# Patient Record
Sex: Female | Born: 1950 | Race: White | Hispanic: No | Marital: Single | Smoking: Never smoker
Health system: Southern US, Community
[De-identification: ages and names within clinical notes are randomized; demographics above are authoritative.]

## PROBLEM LIST (undated history)

## (undated) DIAGNOSIS — R42 Dizziness and giddiness: Secondary | ICD-10-CM

## (undated) DIAGNOSIS — R51 Headache: Secondary | ICD-10-CM

## (undated) DIAGNOSIS — J45909 Unspecified asthma, uncomplicated: Secondary | ICD-10-CM

## (undated) DIAGNOSIS — N6009 Solitary cyst of unspecified breast: Secondary | ICD-10-CM

## (undated) DIAGNOSIS — R519 Headache, unspecified: Secondary | ICD-10-CM

## (undated) DIAGNOSIS — R252 Cramp and spasm: Secondary | ICD-10-CM

## (undated) DIAGNOSIS — M7120 Synovial cyst of popliteal space [Baker], unspecified knee: Secondary | ICD-10-CM

## (undated) DIAGNOSIS — E7800 Pure hypercholesterolemia, unspecified: Secondary | ICD-10-CM

## (undated) DIAGNOSIS — K219 Gastro-esophageal reflux disease without esophagitis: Secondary | ICD-10-CM

## (undated) DIAGNOSIS — M7122 Synovial cyst of popliteal space [Baker], left knee: Secondary | ICD-10-CM

## (undated) DIAGNOSIS — H9190 Unspecified hearing loss, unspecified ear: Secondary | ICD-10-CM

## (undated) HISTORY — DX: Headache, unspecified: R51.9

## (undated) HISTORY — DX: Solitary cyst of unspecified breast: N60.09

## (undated) HISTORY — DX: Pure hypercholesterolemia, unspecified: E78.00

## (undated) HISTORY — DX: Unspecified hearing loss, unspecified ear: H91.90

## (undated) HISTORY — DX: Cramp and spasm: R25.2

## (undated) HISTORY — PX: BREAST SURGERY: SHX581

## (undated) HISTORY — PX: BILATERAL OOPHORECTOMY: SHX1221

## (undated) HISTORY — PX: BREAST EXCISIONAL BIOPSY: SUR124

## (undated) HISTORY — DX: Headache: R51

## (undated) HISTORY — DX: Dizziness and giddiness: R42

## (undated) HISTORY — DX: Synovial cyst of popliteal space (Baker), unspecified knee: M71.20

## (undated) HISTORY — DX: Gastro-esophageal reflux disease without esophagitis: K21.9

## (undated) HISTORY — PX: BREAST CYST EXCISION: SHX579

## (undated) HISTORY — DX: Unspecified asthma, uncomplicated: J45.909

## (undated) HISTORY — DX: Synovial cyst of popliteal space (Baker), left knee: M71.22

## (undated) HISTORY — PX: SEPTOPLASTY: SUR1290

---
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
## 1985-09-19 HISTORY — PX: ABDOMINAL HYSTERECTOMY: SHX81

## 1996-09-19 HISTORY — PX: TEMPOROMANDIBULAR JOINT SURGERY: SHX35

## 2013-02-14 ENCOUNTER — Other Ambulatory Visit (HOSPITAL_COMMUNITY): Payer: Self-pay | Admitting: *Deleted

## 2013-02-14 DIAGNOSIS — Z1231 Encounter for screening mammogram for malignant neoplasm of breast: Secondary | ICD-10-CM

## 2013-02-19 ENCOUNTER — Ambulatory Visit (HOSPITAL_COMMUNITY): Payer: No Typology Code available for payment source

## 2013-02-20 ENCOUNTER — Other Ambulatory Visit: Payer: Self-pay | Admitting: *Deleted

## 2013-02-20 DIAGNOSIS — N644 Mastodynia: Secondary | ICD-10-CM

## 2013-03-20 ENCOUNTER — Ambulatory Visit
Admission: RE | Admit: 2013-03-20 | Discharge: 2013-03-20 | Disposition: A | Discharge status: Home / Self Care | Payer: No Typology Code available for payment source | Source: Ambulatory Visit | Attending: *Deleted | Admitting: *Deleted

## 2013-03-20 DIAGNOSIS — N644 Mastodynia: Secondary | ICD-10-CM

## 2013-03-25 ENCOUNTER — Other Ambulatory Visit (HOSPITAL_COMMUNITY)
Admission: RE | Admit: 2013-03-25 | Discharge: 2013-03-25 | Disposition: A | Discharge status: Home / Self Care | Payer: No Typology Code available for payment source | Source: Ambulatory Visit | Attending: Family Medicine | Admitting: Family Medicine

## 2013-03-25 ENCOUNTER — Other Ambulatory Visit: Payer: Self-pay | Admitting: Family Medicine

## 2013-03-25 DIAGNOSIS — R87810 Cervical high risk human papillomavirus (HPV) DNA test positive: Secondary | ICD-10-CM | POA: Insufficient documentation

## 2013-03-25 DIAGNOSIS — Z1151 Encounter for screening for human papillomavirus (HPV): Secondary | ICD-10-CM | POA: Insufficient documentation

## 2013-03-25 DIAGNOSIS — Z01419 Encounter for gynecological examination (general) (routine) without abnormal findings: Secondary | ICD-10-CM | POA: Insufficient documentation

## 2013-06-10 ENCOUNTER — Ambulatory Visit
Admission: RE | Admit: 2013-06-10 | Discharge: 2013-06-10 | Disposition: A | Discharge status: Home / Self Care | Payer: No Typology Code available for payment source | Source: Ambulatory Visit | Attending: Family Medicine | Admitting: Family Medicine

## 2013-06-10 ENCOUNTER — Other Ambulatory Visit: Payer: Self-pay | Admitting: Family Medicine

## 2013-06-10 DIAGNOSIS — R079 Chest pain, unspecified: Secondary | ICD-10-CM

## 2013-06-10 DIAGNOSIS — R05 Cough: Secondary | ICD-10-CM

## 2013-11-06 ENCOUNTER — Encounter: Payer: Self-pay | Admitting: Cardiology

## 2013-11-07 ENCOUNTER — Ambulatory Visit: Payer: No Typology Code available for payment source | Admitting: Cardiology

## 2013-12-03 ENCOUNTER — Ambulatory Visit: Payer: No Typology Code available for payment source | Admitting: Cardiology

## 2013-12-09 ENCOUNTER — Ambulatory Visit (INDEPENDENT_AMBULATORY_CARE_PROVIDER_SITE_OTHER): Payer: No Typology Code available for payment source | Admitting: Cardiology

## 2013-12-09 ENCOUNTER — Encounter: Payer: Self-pay | Admitting: Cardiology

## 2013-12-09 ENCOUNTER — Encounter (INDEPENDENT_AMBULATORY_CARE_PROVIDER_SITE_OTHER): Payer: Self-pay

## 2013-12-09 VITALS — BP 108/68 | HR 61 | Ht 64.0 in | Wt 142.1 lb

## 2013-12-09 DIAGNOSIS — M549 Dorsalgia, unspecified: Secondary | ICD-10-CM

## 2013-12-09 DIAGNOSIS — Z8249 Family history of ischemic heart disease and other diseases of the circulatory system: Secondary | ICD-10-CM | POA: Insufficient documentation

## 2013-12-09 DIAGNOSIS — E7800 Pure hypercholesterolemia, unspecified: Secondary | ICD-10-CM

## 2013-12-09 NOTE — Progress Notes (Signed)
      [facility] [address], [address], [place]  [address] Phone: [phone] Fax:  [phone]  Date:  12/09/2013   ID:  Tracey Henson, DOB Oct 04, 1950, MRN [id number]  PCP:  Lynne Logan, MD   History of Present Illness: Tracey Henson is a 63 y.o. female with strong family history of coronary artery disease with 13 siblings with CAD who underwent nuclear stress test 9/14 that overall was low risk but no ischemia however she did have upsloping ST segment depression on EKG portion. She is here for followup.  Still have jabbing pain in right shoulder blade. No significant exertional anginal symptoms.  Sister has CAD. She is moving down from [place].  She has not tolerated Crestor in the past. She's not interested in statin-type medications.   Wt Readings from Last 3 Encounters:  12/09/13 142 lb 1.9 oz (64.465 kg)     Past Medical History  Diagnosis Date  . Breast cyst   . Baker cyst   . Hypercholesteremia     No past surgical history on file.  No current outpatient prescriptions on file.   No current facility-administered medications for this visit.    Allergies:    Allergies  Allergen Reactions  . Cefaclor Hives  . Codeine   . Crestor [Rosuvastatin] Nausea And Vomiting    Social History:  The patient  reports that she has never smoked. She does not have any smokeless tobacco history on file. She reports that she drinks alcohol. She reports that she does not use illicit drugs.   ROS:  Please see the history of present illness.   Denies any syncope, bleeding, orthopnea, PND or    PHYSICAL EXAM: VS:  BP 108/68  Pulse 61  Ht 5\' 4"  (1.626 m)  Wt 142 lb 1.9 oz (64.465 kg)  BMI 24.38 kg/m2 Well nourished, well developed, in no acute distress HEENT: normal Neck: no JVD Cardiac:  normal S1, S2; RRR; no murmur Lungs:  clear to auscultation bilaterally, no wheezing, rhonchi or rales Abd: soft, nontender, no hepatomegaly Ext: no edema Skin: warm and  dry Neuro: no focal abnormalities noted  EKG:  Sinus rhythm, 61, no other abnormalities    Nuclear stress 9/14-low risk, no ischemia-upsloping ST segment depression, nondiagnostic Labs: 7/14-LDL 147  ASSESSMENT AND PLAN:  1. Chest pain-atypical, right scapular pain. Likely muscular skeletal. Recent nuclear stress test reassuring. Continue with aggressive primary prevention. Exercise. 2. Strong family history of CAD-multiple siblings. Father. Primary prevention. I would like for her to discuss options with [employer], Pharm.D. in lipid clinic. I would like for her to reduce her risk is much as possible. She's not very interested in pharmacologic solutions however. 3. Hyperlipidemia-LDL 147. As above.  Signed, Candee Furbish, MD [facility]  12/09/2013 9:54 AM

## 2013-12-09 NOTE — Patient Instructions (Signed)
Your physician recommends that you continue on your current medications as directed. Please refer to the Current Medication list given to you today.  You have been referred to [facility]  [facility]).  Your physician wants you to follow-up in: 1 year with Dr. Marlou Porch. You will receive a reminder letter in the mail two months in advance. If you don't receive a letter, please call our office to schedule the follow-up appointment.

## 2013-12-31 ENCOUNTER — Ambulatory Visit: Payer: No Typology Code available for payment source | Admitting: Pharmacist

## 2014-06-18 ENCOUNTER — Other Ambulatory Visit: Payer: Self-pay

## 2014-06-18 DIAGNOSIS — Z1231 Encounter for screening mammogram for malignant neoplasm of breast: Secondary | ICD-10-CM

## 2014-06-24 ENCOUNTER — Ambulatory Visit
Admission: RE | Admit: 2014-06-24 | Discharge: 2014-06-24 | Disposition: A | Discharge status: Home / Self Care | Payer: No Typology Code available for payment source | Source: Ambulatory Visit

## 2014-06-24 DIAGNOSIS — Z1231 Encounter for screening mammogram for malignant neoplasm of breast: Secondary | ICD-10-CM

## 2014-07-04 ENCOUNTER — Emergency Department (HOSPITAL_BASED_OUTPATIENT_CLINIC_OR_DEPARTMENT_OTHER): Payer: No Typology Code available for payment source

## 2014-07-04 ENCOUNTER — Encounter (HOSPITAL_BASED_OUTPATIENT_CLINIC_OR_DEPARTMENT_OTHER): Payer: Self-pay | Admitting: Emergency Medicine

## 2014-07-04 ENCOUNTER — Emergency Department (HOSPITAL_BASED_OUTPATIENT_CLINIC_OR_DEPARTMENT_OTHER)
Admission: EM | Admit: 2014-07-04 | Discharge: 2014-07-04 | Disposition: A | Discharge status: Home / Self Care | Payer: No Typology Code available for payment source | Attending: Emergency Medicine | Admitting: Emergency Medicine

## 2014-07-04 DIAGNOSIS — Z8742 Personal history of other diseases of the female genital tract: Secondary | ICD-10-CM | POA: Insufficient documentation

## 2014-07-04 DIAGNOSIS — M65841 Other synovitis and tenosynovitis, right hand: Secondary | ICD-10-CM | POA: Diagnosis not present

## 2014-07-04 DIAGNOSIS — M25531 Pain in right wrist: Secondary | ICD-10-CM | POA: Diagnosis present

## 2014-07-04 DIAGNOSIS — M778 Other enthesopathies, not elsewhere classified: Secondary | ICD-10-CM

## 2014-07-04 MED ORDER — NAPROXEN 500 MG PO TABS
500.0000 mg | ORAL_TABLET | Freq: Two times a day (BID) | ORAL | Status: DC
Start: 2014-07-04 — End: 2015-05-22

## 2014-07-04 MED ORDER — OXYCODONE-ACETAMINOPHEN 5-325 MG PO TABS: 1.0000 | ORAL_TABLET | Freq: Four times a day (QID) | ORAL | Status: DC | PRN

## 2014-07-04 NOTE — Discharge Instructions (Signed)
Return to the ED with any concerns including increased pain, swelling/numbness/discoloration of fingers, fever, or any other alarming symptoms

## 2014-07-04 NOTE — ED Notes (Signed)
Pt c/o right wrist pain that began 2 weeks ago. Over the last 2 days, it has become more painful and she is unable to use it.

## 2014-07-04 NOTE — ED Provider Notes (Signed)
CSN: [id number]     Arrival date & time 07/04/14  7867 History   First MD Initiated Contact with Patient 07/04/14 [phone]     Chief Complaint  Patient presents with  . Wrist Pain     (Consider location/radiation/quality/duration/timing/severity/associated sxs/prior Treatment) HPI Pt presenting with c/o pain in right wrist which began 2 weeks ago.  Pt states that pain has been getting worse since it began.  No trauma or falls.  Pain is worse with palpation along radial aspect of her wrist and worse with movement of wrist.  Pain is constant and aching.  She had similar symptoms a couple of years ago with repetitive movements of brewing lattes- this was resolved by wearing a brace, this time she ha been wearing a brace from home without much relief.  There are no other associated systemic symptoms, there are no other alleviating or modifying factors.   Past Medical History  Diagnosis Date  . Breast cyst   . Baker cyst   . Hypercholesteremia    Past Surgical History  Procedure Laterality Date  . Abdominal hysterectomy    . Breast surgery    . Temporomandibular joint surgery    . Appendectomy     Family History  Problem Relation Age of Onset  . Lung cancer Mother   . Heart attack Mother   . Coronary artery disease Mother   . Heart attack Father   . Coronary artery disease Father   . Heart disease Sister   . Stroke Maternal Grandmother   . Heart attack Maternal Grandfather   . Coronary artery disease Maternal Grandfather    History  Substance Use Topics  . Smoking status: Never Smoker   . Smokeless tobacco: Not on file  . Alcohol Use: No     Comment: Occassional wine   OB History   Grav Para Term Preterm Abortions TAB SAB Ect Mult Living                 Review of Systems ROS reviewed and all otherwise negative except for mentioned in HPI    Allergies  Cefaclor; Codeine; and Crestor  Home Medications   Prior to Admission medications   Medication Sig Start Date End  Date Taking? Authorizing Provider  naproxen (NAPROSYN) 500 MG tablet Take 1 tablet (500 mg total) by mouth 2 (two) times daily. 07/04/14   Threasa Beards, MD  oxyCODONE-acetaminophen (PERCOCET/ROXICET) 5-325 MG per tablet Take 1-2 tablets by mouth every 6 (six) hours as needed for severe pain. 07/04/14   Threasa Beards, MD   BP 120/44  Pulse 59  Temp(Src) 97.6 F (36.4 C) (Oral)  Resp 18  Ht 5\' 4"  (1.626 m)  Wt 138 lb (62.596 kg)  BMI 23.68 kg/m2  SpO2 99% Vitals reviewed Physical Exam Physical Examination: General appearance - alert, well appearing, and in no distress Mental status - alert, oriented to person, place, and time Eyes - no conjunctival injection, no scleral icterus Neck - supple, no significant adenopathy Chest - clear to auscultation, no wheezes, rales or rhonchi, symmetric air entry Heart - normal rate, regular rhythm, normal S1, S2, no murmurs, rubs, clicks or gallops Neurological - alert, oriented x 3, sensation intact distally in fingers Musculoskeletal -ttp over radial aspect of rigth wrist, pain with flexion of right thumb, otherwise  no joint tenderness, deformity or swelling Extremities - peripheral pulses normal, no pedal edema, no clubbing or cyanosis, 2+ radial pulses normal Skin - normal coloration and turgor, no rashes  ED Course  Procedures (including critical care time) Labs Review Labs Reviewed - No data to display  Imaging Review Dg Wrist Complete Right  07/04/2014   CLINICAL DATA:  Right wrist pain and tenderness beginning 2 weeks ago without injury.  EXAM: RIGHT WRIST - COMPLETE 3+ VIEW  COMPARISON:  None.  FINDINGS: No fracture is identified. There is suggestion of dorsal subluxation of the distal ulna relative to the distal radius on the lateral images, however there is some degree of obliquity on both lateral images and the relationship between the distal radius and ulna appears within normal limits on the PA projection. There is mild soft  tissue swelling at the radial aspect of the wrist. Joint spaces are preserved. Mild osteophytosis is noted at the first [facility] joint. No lytic or blastic osseous lesion is seen.  IMPRESSION: 1. Mild soft tissue swelling at the radial aspect of the wrist without definite acute osseous abnormality identified. 2. Suggestion of dorsal subluxation of the ulna on lateral images, however this may be projectional due to obliquity.   Electronically Signed   By: Logan Bores   On: 07/04/2014 09:35     EKG Interpretation None      MDM   Final diagnoses:  Tendonitis of wrist, right   Pt presenting with 2 weeks of right wrist pain, + finklesteins test, ttp over pollicis longis tendon.  Xray reassuring.  Pt placed in velcro splint, advised rest, ibuprofen, pain meds for breakthrough pain.  Discharged with strict return precautions.  Pt agreeable with plan.   Xray images reviewed and interpreted by me as well.  Nursing notes including past medical history and social history reviewed and considered in documentation     Threasa Beards, MD 07/04/14 1526

## 2015-01-22 DIAGNOSIS — H5210 Myopia, unspecified eye: Secondary | ICD-10-CM | POA: Diagnosis not present

## 2015-01-22 DIAGNOSIS — H524 Presbyopia: Secondary | ICD-10-CM | POA: Diagnosis not present

## 2015-03-02 DIAGNOSIS — Z1211 Encounter for screening for malignant neoplasm of colon: Secondary | ICD-10-CM | POA: Diagnosis not present

## 2015-03-02 DIAGNOSIS — R002 Palpitations: Secondary | ICD-10-CM | POA: Diagnosis not present

## 2015-03-02 DIAGNOSIS — H9190 Unspecified hearing loss, unspecified ear: Secondary | ICD-10-CM | POA: Diagnosis not present

## 2015-03-02 DIAGNOSIS — Z0000 Encounter for general adult medical examination without abnormal findings: Secondary | ICD-10-CM | POA: Diagnosis not present

## 2015-03-02 DIAGNOSIS — E785 Hyperlipidemia, unspecified: Secondary | ICD-10-CM | POA: Diagnosis not present

## 2015-03-16 ENCOUNTER — Telehealth: Payer: Self-pay | Admitting: Cardiology

## 2015-03-16 DIAGNOSIS — E785 Hyperlipidemia, unspecified: Secondary | ICD-10-CM

## 2015-03-16 NOTE — Telephone Encounter (Signed)
Spoke with pt and she states that her labs were checked by her PCP recently and her LDL was noted to be 181. Pt states that she started taking Red Yeast Rice 600mg - 2 tabs QD. Educated pt about diet.  Pt states that she eats a lot of fruits and vegetables, eats red meat once a month and very rarely eats processed food. Pt states that she normally does right much walking but has not recently due to hurting her back. Pt would like to know if Dr. Marlou Porch had any other recommendations of what she can do to improve her lipids. Informed pt that I would route this information to Dr. Marlou Porch for review and advisement. Called Dr. Lynnda Child office and asked for labs to be faxed over to our office.

## 2015-03-16 NOTE — Telephone Encounter (Signed)
New Message       Pt calling stating that she recently had lab work done and her LDL's were double what they should be. Pt states that she started taking Red Yeast Rice Pills but wants to know if there is anything else Dr. Marlou Porch recommends. Please call back and advise.

## 2015-03-17 ENCOUNTER — Encounter: Payer: Self-pay | Admitting: Cardiology

## 2015-03-17 DIAGNOSIS — Z1211 Encounter for screening for malignant neoplasm of colon: Secondary | ICD-10-CM | POA: Diagnosis not present

## 2015-03-17 NOTE — Telephone Encounter (Signed)
Set up with Elberta Leatherwood Pharm D in lipid clinic Candee Furbish, MD

## 2015-03-17 NOTE — Telephone Encounter (Signed)
Pt aware.  She will await a call back to schedule an appt.

## 2015-04-06 DIAGNOSIS — M25531 Pain in right wrist: Secondary | ICD-10-CM | POA: Diagnosis not present

## 2015-04-06 DIAGNOSIS — M25532 Pain in left wrist: Secondary | ICD-10-CM | POA: Diagnosis not present

## 2015-04-07 DIAGNOSIS — H9113 Presbycusis, bilateral: Secondary | ICD-10-CM | POA: Diagnosis not present

## 2015-04-09 DIAGNOSIS — E785 Hyperlipidemia, unspecified: Secondary | ICD-10-CM | POA: Insufficient documentation

## 2015-04-09 NOTE — Addendum Note (Signed)
Addended by: Elberta Leatherwood R on: 04/09/2015 03:22 PM   Modules accepted: Orders

## 2015-04-09 NOTE — Telephone Encounter (Signed)
Spoke with pt.  She just started Red Yeast Rice- 4 tablets a day about 3 weeks ago.  Will have her continue this and recheck labs in ~ 1 month.  She has follow up with Dr. Marlou Porch at the beginning of September.  Depending on the lab results, will make appt with lipid clinic if needed.

## 2015-05-07 ENCOUNTER — Ambulatory Visit: Payer: No Typology Code available for payment source | Admitting: Cardiology

## 2015-05-20 ENCOUNTER — Other Ambulatory Visit (INDEPENDENT_AMBULATORY_CARE_PROVIDER_SITE_OTHER): Payer: Commercial Managed Care - HMO | Admitting: *Deleted

## 2015-05-20 DIAGNOSIS — E785 Hyperlipidemia, unspecified: Secondary | ICD-10-CM | POA: Diagnosis not present

## 2015-05-20 LAB — HEPATIC FUNCTION PANEL
ALK PHOS: 53 U/L (ref 39–117)
ALT: 17 U/L (ref 0–35)
AST: 18 U/L (ref 0–37)
Albumin: 3.9 g/dL (ref 3.5–5.2)
BILIRUBIN DIRECT: 0.1 mg/dL (ref 0.0–0.3)
TOTAL PROTEIN: 6.5 g/dL (ref 6.0–8.3)
Total Bilirubin: 0.5 mg/dL (ref 0.2–1.2)

## 2015-05-20 NOTE — Addendum Note (Signed)
Addended by: Eulis Foster on: 05/20/2015 08:13 AM   Modules accepted: Orders

## 2015-05-22 ENCOUNTER — Ambulatory Visit (INDEPENDENT_AMBULATORY_CARE_PROVIDER_SITE_OTHER): Payer: Commercial Managed Care - HMO | Admitting: Cardiology

## 2015-05-22 ENCOUNTER — Encounter: Payer: Self-pay | Admitting: Cardiology

## 2015-05-22 ENCOUNTER — Ambulatory Visit (INDEPENDENT_AMBULATORY_CARE_PROVIDER_SITE_OTHER)
Admission: RE | Admit: 2015-05-22 | Discharge: 2015-05-22 | Disposition: A | Discharge status: Home / Self Care | Payer: Commercial Managed Care - HMO | Source: Ambulatory Visit | Attending: Cardiology | Admitting: Cardiology

## 2015-05-22 VITALS — BP 130/64 | HR 54 | Ht 64.0 in | Wt 140.0 lb

## 2015-05-22 DIAGNOSIS — R079 Chest pain, unspecified: Secondary | ICD-10-CM

## 2015-05-22 DIAGNOSIS — Z8249 Family history of ischemic heart disease and other diseases of the circulatory system: Secondary | ICD-10-CM

## 2015-05-22 DIAGNOSIS — E785 Hyperlipidemia, unspecified: Secondary | ICD-10-CM | POA: Diagnosis not present

## 2015-05-22 LAB — NMR LIPOPROFILE WITH LIPIDS
CHOLESTEROL, TOTAL: 221 mg/dL — AB (ref 100–199)
HDL Particle Number: 29.1 umol/L — ABNORMAL LOW (ref >=30.5)
HDL Size: 8.9 nm — ABNORMAL LOW (ref >=9.2)
HDL-C: 50 mg/dL (ref >39)
LDL CALC: 153 mg/dL — AB (ref 0–99)
LDL Particle Number: 1942 nmol/L — ABNORMAL HIGH (ref <1000)
LDL Size: 21 nm (ref >=20.8)
LP-IR SCORE: 37 (ref <=45)
Large HDL-P: 5.6 umol/L (ref >=4.8)
Large VLDL-P: 1.2 nmol/L (ref <=2.7)
Small LDL Particle Number: 890 nmol/L — ABNORMAL HIGH (ref <=527)
TRIGLYCERIDES: 88 mg/dL (ref 0–149)
VLDL SIZE: 43 nm (ref <=46.6)

## 2015-05-22 NOTE — Progress Notes (Signed)
[facility]. [address], [address] [place], [place]  [address] Phone: [phone] Fax:  [phone]  Date:  05/22/2015   ID:  Tracey Henson, DOB [birth year], MRN [id number]  PCP:  Lynne Logan, MD   History of Present Illness: Tracey Henson is a 64 y.o. female with strong family history of coronary artery disease with 13 siblings with CAD who underwent nuclear stress test 9/14 that overall was low risk but no ischemia however she did have upsloping ST segment depression on EKG portion. She is here for followup.  She has been taking Red Yeast Rice- 4 tablets a day about 6 weeks ago. Her calculated LDL was 153, above normal, LDL particle was 1942, HDL particle number was 29, total cholesterol 221, small LDL particle number was 890 with normal being less than 527. Liver functions normal.  Still have jabbing pain in right shoulder blade. No significant exertional anginal symptoms. She's also had some atypical substernal discomfort that is fleeting. She will feel palpitations and pounding. When taking a deep breath, she feels dizzy at times.  Sister has CAD. She is moving down from [place]. She reminded me that her father had an EKG on 16-Jan-2023 and then died on 2023-01-18.  She has not tolerated Crestor in the past. She's not interested in statin-type medications.   Wt Readings from Last 3 Encounters:  05/22/15 140 lb (63.504 kg)  07/04/14 138 lb (62.596 kg)  12/09/13 142 lb 1.9 oz (64.465 kg)     Past Medical History  Diagnosis Date  . Breast cyst   . Baker cyst   . Hypercholesteremia     Past Surgical History  Procedure Laterality Date  . Abdominal hysterectomy    . Breast surgery    . Temporomandibular joint surgery    . Appendectomy      No current outpatient prescriptions on file.   No current facility-administered medications for this visit.    Allergies:    Allergies  Allergen Reactions  . Cefaclor Hives  . Codeine   . Crestor [Rosuvastatin] Nausea And Vomiting     Social History:  The patient  reports that she has never smoked. She does not have any smokeless tobacco history on file. She reports that she does not drink alcohol or use illicit drugs.   ROS:  Please see the history of present illness.   Denies any syncope, bleeding, orthopnea, PND or    PHYSICAL EXAM: VS:  BP 130/64 mmHg  Pulse 54  Ht 5\' 4"  (1.626 m)  Wt 140 lb (63.504 kg)  BMI 24.02 kg/m2 Well nourished, well developed, in no acute distress HEENT: normal Neck: no JVD Cardiac:  normal S1, S2; RRR; no murmur Lungs:  clear to auscultation bilaterally, no wheezing, rhonchi or rales Abd: soft, nontender, no hepatomegaly Ext: no edema Skin: warm and dry Neuro: no focal abnormalities noted  EKG:  Today 05/22/2015-sinus bradycardia rate 54 with no other abnormalities. Personally viewed-prior Sinus rhythm, 61, no other abnormalities    Nuclear stress 9/14-low risk, no ischemia-upsloping ST segment depression, nondiagnostic Labs: As described above  ASSESSMENT AND PLAN:  1. Chest pain-atypical, right scapular pain. Likely musculoskeletal. Recent nuclear stress test reassuring 2013/01/11. Continue with aggressive primary prevention. Exercise. She eats for a well, no hormones, weight is normal. Her father in 01-12-67 EKG on 01/16/2023 and died on 2023-01-17. I will order her a CT calcium score, discussed with her out-of-pocket expense. This may help [place] with her decision  matrix and for further risk stratification for her. 2. Strong family history of CAD-multiple siblings. Father. Primary prevention. lipid clinic. I would like for her to reduce her risk is much as possible. She's not very interested in pharmacologic solutions however. She has tried red yeast rice however LDL remains elevated. I would like to have her sit down with Elberta Leatherwood, Pharm.D. to discuss further options. 3. Hyperlipidemia-LDL. As above. 4. Palpitations-occasionally will feel pounding/racing of her heart at night. Fleeting,  transient. Continue to monitor.  Signed, Candee Furbish, MD [facility]  05/22/2015 9:11 AM

## 2015-05-22 NOTE — Patient Instructions (Signed)
Medication Instructions:  Your physician recommends that you continue on your current medications as directed. Please refer to the Current Medication list given to you today.  Testing/Procedures: CT scanning for a Calcium score, , is a noninvasive, special x-ray that produces cross-sectional images of the body using x-rays and a computer. CT scans help physicians diagnose and treat medical conditions. For some CT exams, a contrast material is used to enhance visibility in the area of the body being studied. CT scans provide greater clarity and reveal more details than regular x-ray exams.  You have been referred to the [facility].  Follow-Up: Will be determined after testing and appointment with Lipid Clinic.  Thank you for choosing [facility]!!

## 2015-05-28 ENCOUNTER — Ambulatory Visit (INDEPENDENT_AMBULATORY_CARE_PROVIDER_SITE_OTHER): Payer: Commercial Managed Care - HMO | Admitting: Pharmacist

## 2015-05-28 DIAGNOSIS — E785 Hyperlipidemia, unspecified: Secondary | ICD-10-CM | POA: Diagnosis not present

## 2015-05-28 MED ORDER — ATORVASTATIN CALCIUM 40 MG PO TABS: 40.0000 mg | ORAL_TABLET | Freq: Every day | ORAL | Status: DC

## 2015-05-28 NOTE — Progress Notes (Signed)
Patient ID: Tracey Henson                 DOB: 11-Nov-2050, 64 yo                         MRN: [id number]     HPI: Tracey Henson is a 64 y.o. female patient referred to lipid clinic by Dr. Marlou Porch. She has a very strong family history of CAD and underwent nuclear stress test in 05/2013 that overall was low risk. In 8-05/2015, she underwent risk stratification with coronary calcium scoring and NMR lipoprofile which showed calcium scoring in the 52nd percentile and an elevated LDL particle number of 1942.   Patient has a strong aversion to taking medications. She currently only takes vitamin B12 for energy and would like to avoid medications if possible. She did previously take red yeast rice, but reports that she stopped taking it. She has also tried Crestor in the past but experienced nausea and vomiting. Of note, patient has had bad leg cramps at night for many years that she uses hot towels to help with.   Current Medications: none Intolerances: Crestor - nausea and vomiting Risk Factors: family history, elevated LDL particle number LDL goal: < 100mg /dL  Diet: Breakfast - green juice. Lunch - cereal or oatmeal and a banana. Dinner - baked chicken and vegetables. Patient eats most meals at home and only shops at the produce and meat markets at the grocery store. She reports a low carb diet as well.  Exercise: Walks 1-2 miles daily with her dog. 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  Family History: Father deceased from MI at age 2, father had 69 siblings, 76 of them had CAD. Sister with heart disease since age 72  Social History: No tobacco, alcohol, or drug use  Labs: 05/20/15: NMR lipoprofile - LDL particle number 1942, small LDL particle number 890, LDL 153, HDL 50, TG 88, TC 221 05/22/15: coronary calcium scoring - 15 (52nd percentile for age and sex). Scattered calcium in mid LAD and proximal circumflex  Past Medical History  Diagnosis Date  .  Breast cyst   . Baker cyst   . Hypercholesteremia     Current Outpatient Prescriptions on File Prior to Visit  Medication Sig Dispense Refill  . Cyanocobalamin (VITAMIN B 12 PO) Take 1 tablet by mouth daily.     No current facility-administered medications on file prior to visit.    Allergies  Allergen Reactions  . Cefaclor Hives  . Codeine   . Crestor [Rosuvastatin] Nausea And Vomiting    Assessment/Plan:  1. Hyperlipidemia - patient with a strong family history of CAD at elevated risk for cardiac event given high LDL particle number of 1942. Calcium scoring was 15 (52nd percentile) with scattered calcium in the mid LAD and proximal circumflex. Her LDL goal is < 100mg /dL and she is currently on no drug therapy. We had an extensive conversation regarding both lifestyle and medications helping to lower cholesterol. Given patient's healthy lifestyle, elevated LDL and particle number very likely due to genetics given extensive family history of CAD. Discussed efficacy and side effects of statins in length. Patient agreeable to starting atorvastatin 40mg  daily. Advised her that since she already has a history of leg cramps on no drug, that she can try CoQ10 200-300mg  daily to help with leg cramping. Will follow up with lipid panel and clinic visit in 3  months.   Tracey Henson, PharmD [facility] [address] [place], [place] [address] Phone: [phone]; Fax: [phone] 05/28/2015 10:09 AM

## 2015-05-28 NOTE — Patient Instructions (Signed)
Please pick up your new prescription for Lipitor (atorvastatin) 40mg  once daily Call us if you have any side effects at [phone] If your leg cramps get worse, you can try CoQ10 (Coenzyme Q 10) take 200-300mg  once daily Follow up with fasting lipid panel in 3 months on Tuesday December 6th. Lab opens at 7:30am, come in any time after Come see [place] in lipid clinic the next day on Wednesday December 7th at [facility]

## 2015-06-08 ENCOUNTER — Encounter: Payer: Self-pay | Admitting: Internal Medicine

## 2015-06-08 ENCOUNTER — Ambulatory Visit (INDEPENDENT_AMBULATORY_CARE_PROVIDER_SITE_OTHER): Payer: Commercial Managed Care - HMO | Admitting: Internal Medicine

## 2015-06-08 VITALS — BP 108/62 | HR 55 | Ht 64.0 in | Wt 140.0 lb

## 2015-06-08 DIAGNOSIS — R05 Cough: Secondary | ICD-10-CM | POA: Diagnosis not present

## 2015-06-08 DIAGNOSIS — R918 Other nonspecific abnormal finding of lung field: Secondary | ICD-10-CM

## 2015-06-08 DIAGNOSIS — Z23 Encounter for immunization: Secondary | ICD-10-CM

## 2015-06-08 DIAGNOSIS — R0600 Dyspnea, unspecified: Secondary | ICD-10-CM

## 2015-06-08 DIAGNOSIS — R058 Other specified cough: Secondary | ICD-10-CM | POA: Insufficient documentation

## 2015-06-08 DIAGNOSIS — R0609 Other forms of dyspnea: Secondary | ICD-10-CM | POA: Insufficient documentation

## 2015-06-08 MED ORDER — BENZONATATE 200 MG PO CAPS: ORAL_CAPSULE | ORAL | Status: DC

## 2015-06-08 NOTE — Progress Notes (Signed)
Subjective:    Patient ID: Tracey Henson, female    DOB: 11-01-1950,     MRN: [id number]  HPI  1 yowf never smoker with h/o seasonal rhinitis  X 1980s then intermittent cough mid 90s not correlating with rhinitis may be once or twice a week esp hs then doe progressive x 2016 and can't fast pace walking or hills due to sob > cardiac eval > CT heart > mpns > referred 06/08/2015 by Dr Nancy Fetter to pulmonary clinic.     06/08/2015 1st [facility] office visit/ Tracey Henson   Chief Complaint  Patient presents with  . Pulmonary Consult    Referred by Dr. Nancy Fetter for pulmonary nodules. Pt states that she has had DOE for "as long as I can remember"- gets out of breath pushing a lawn mower. She has had CP and cough that come and go.    onset of cough was indolent x 4 y prior to OV  assoc with sense of pnds daily and at hs but no excess mucus, then midline cp with severe coughing fits that occur once or twice a week assoc with doe and variable HB  No obvious other patterns in day to day or daytime variabilty or assoc chest tightness, subjective wheeze . No unusual exp hx or h/o childhood pna/ asthma or knowledge of premature birth.  Sleeping ok without nocturnal  or early am exacerbation  of respiratory  c/o's or need for noct saba. Also denies any obvious fluctuation of symptoms with weather or environmental changes or other aggravating or alleviating factors except as outlined above   Current Medications, Allergies, Complete Past Medical History, Past Surgical History, Family History, and Social History were reviewed in [employer] record.            Review of Systems  Constitutional: Negative for fever, chills and unexpected weight change.  HENT: Positive for dental problem. Negative for congestion, ear pain, nosebleeds, postnasal drip, rhinorrhea, sinus pressure, sneezing, sore throat, trouble swallowing and voice change.   Eyes: Negative for visual disturbance.  Respiratory:  Positive for shortness of breath. Negative for cough and choking.   Cardiovascular: Negative for chest pain and leg swelling.  Gastrointestinal: Negative for vomiting, abdominal pain and diarrhea.  Genitourinary: Negative for difficulty urinating.       Acid heartburn  Musculoskeletal: Negative for arthralgias.  Skin: Negative for rash.  Neurological: Positive for headaches. Negative for tremors and syncope.  Hematological: Does not bruise/bleed easily.       Objective:   Physical Exam   Pleasant amb wf with freq throat clearing  Wt Readings from Last 3 Encounters:  06/08/15 140 lb (63.504 kg)  05/22/15 140 lb (63.504 kg)  07/04/14 138 lb (62.596 kg)    Vital signs reviewed   HEENT: nl dentition, turbinates, and orophanx. Nl external ear canals without cough reflex   NECK :  without JVD/Nodes/TM/ nl carotid upstrokes bilaterally   LUNGS: no acc muscle use, clear to A and P bilaterally without cough on insp or exp maneuvers   CV:  RRR  no s3 or murmur or increase in P2, no edema   ABD:  soft and nontender with nl excursion in the supine position. No bruits or organomegaly, bowel sounds nl  MS:  warm without deformities, calf tenderness, cyanosis or clubbing  SKIN: warm and dry without lesions    NEURO:  alert, approp, no deficits    CT chest   05/22/15  mpns x 4  mm      Assessment & Plan:

## 2015-06-08 NOTE — Patient Instructions (Signed)
Try prilosec otc 20mg   Take 30-60 min before first meal of the day and Pepcid ac (famotidine) 20 mg one @  bedtime  Until return   For drainage take chlortrimeton (chlorpheniramine) 4 mg every 4 hours available over the counter (may cause drowsiness)   GERD (REFLUX)  is an extremely common cause of respiratory symptoms just like yours , many times with no obvious heartburn at all.    It can be treated with medication, but also with lifestyle changes including elevation of the head of your bed (ideally with 6 inch  bed blocks),  Smoking cessation, avoidance of late meals, excessive alcohol, and avoid fatty foods, chocolate, peppermint, colas, red wine, and acidic juices such as orange juice.  NO MINT OR MENTHOL PRODUCTS SO NO COUGH DROPS  USE SUGARLESS CANDY INSTEAD (Jolley ranchers or Stover's or Life Savers) or even ice chips will also do - the key is to swallow to prevent all throat clearing. NO OIL BASED VITAMINS - use powdered substitutes.  If still coughing you will need tessilon 200 mg up to 4 hours     Please schedule a follow up office visit in 6 weeks, call sooner if needed

## 2015-06-10 DIAGNOSIS — R918 Other nonspecific abnormal finding of lung field: Secondary | ICD-10-CM | POA: Insufficient documentation

## 2015-06-10 NOTE — Assessment & Plan Note (Addendum)
-   06/08/2015  Walked RA x 3 laps @ 185 ft each stopped due to End of study, fast pace, min sob no desat     Symptoms are markedly disproportionate to objective findings and not clear this is a lung problem but pt does appear to have difficult airway management issues. DDX of  difficult airways management all start with A and  include Adherence, Ace Inhibitors, Acid Reflux, Active Sinus Disease, Alpha 1 Antitripsin deficiency, Anxiety masquerading as Airways dz,  ABPA,  allergy(esp in young), Aspiration (esp in elderly), Adverse effects of meds,  Active smokers, A bunch of PE's (a small clot burden can't cause this syndrome unless there is already severe underlying pulm or vascular dz with poor reserve) plus two Bs  = Bronchiectasis and Beta blocker use..and one C= CHF  Adherence is always the initial "prime suspect" and is a multilayered concern that requires a "trust but verify" approach in every patient - starting with knowing how to use medications, especially inhalers, correctly, keeping up with refills and understanding the fundamental difference between maintenance and prns vs those medications only taken for a very short course and then stopped and not refilled.   ? Acid (or non-acid) GERD > always difficult to exclude as up to 75% of pts in some series report no assoc GI/ Heartburn symptoms> rec max (24h)  acid suppression and diet restrictions/ reviewed and instructions given in writing.   ? Active sinus dz> consider sinus ct next  ? Allergy/ pnds > try 1st gen H1  ? Cardiac > w/u neg  Will start with the simple first then consider cpst if not improving -  See instructions for specific recommendations which were reviewed directly with the patient who was given a copy with highlighter outlining the key components.

## 2015-06-10 NOTE — Assessment & Plan Note (Signed)
The most common causes of chronic cough in immunocompetent adults include the following: upper airway cough syndrome (UACS), previously referred to as postnasal drip syndrome (PNDS), which is caused by variety of rhinosinus conditions; (2) asthma; (3) GERD; (4) chronic bronchitis from cigarette smoking or other inhaled environmental irritants; (5) nonasthmatic eosinophilic bronchitis; and (6) bronchiectasis.   These conditions, singly or in combination, have accounted for up to 94% of the causes of chronic cough in prospective studies.   Other conditions have constituted no >6% of the causes in prospective studies These have included bronchogenic carcinoma, chronic interstitial pneumonia, sarcoidosis, left ventricular failure, ACEI-induced cough, and aspiration from a condition associated with pharyngeal dysfunction.    Chronic cough is often simultaneously caused by more than one condition. A single cause has been found from 38 to 82% of the time, multiple causes from 18 to 62%. Multiply caused cough has been the result of three diseases up to 42% of the time.       Based on hx and exam, this is most likely:  Classic Upper airway cough syndrome, so named because it's frequently impossible to sort out how much is  CR/sinusitis with freq throat clearing (which can be related to primary GERD)   vs  causing  secondary (" extra esophageal")  GERD from wide swings in gastric pressure that occur with throat clearing, often  promoting self use of mint and menthol lozenges that reduce the lower esophageal sphincter tone and exacerbate the problem further in a cyclical fashion.   These are the same pts (now being labeled as having "irritable larynx syndrome" by some cough centers) who not infrequently have a history of having failed to tolerate ace inhibitors,  dry powder inhalers or biphosphonates or report having atypical reflux symptoms that don't respond to standard doses of PPI , and are easily confused as  having aecopd or asthma flares by even experienced allergists/ pulmonologists.   The first step is to maximize acid suppression and eliminate cyclical coughing then regroup if the cough persists.  I had an extended discussion with the patient reviewing all relevant studies completed to date and  lasting 35 min  1) Explained: The standardized cough guidelines published in Chest by Lissa Morales in 2006 are still the best available and consist of a multiple step process (up to 12!) , not a single office visit,  and are intended  to address this problem logically,  with an alogrithm dependent on response to empiric treatment at  each progressive step  to determine a specific diagnosis with  minimal addtional testing needed. Therefore if adherence is an issue or can't be accurately verified,  it's very unlikely the standard evaluation and treatment will be successful here.    Furthermore, response to therapy (other than acute cough suppression, which should only be used short term with avoidance of narcotic containing cough syrups if possible), can be a gradual process for which the patient may perceive immediate benefit.  Unlike going to an eye doctor where the best perscription is almost always the first one and is immediately effective, this is almost never the case in the management of chronic cough syndromes. Therefore the patient needs to commit up front to consistently adhere to recommendations  for up to 6 weeks of therapy directed at the likely underlying problem(s) before the response can be reasonably evaluated.     2) Each maintenance medication was reviewed in detail including most importantly the difference between maintenance and prns and under what  circumstances the prns are to be triggered using an action plan format that is not reflected in the computer generated alphabetically organized AVS.    Please see instructions for details which were reviewed in writing and the patient given a  copy highlighting the part that I personally wrote and discussed at today's ov.   See instructions for specific recommendations which were reviewed directly with the patient who was given a copy with highlighter outlining the key components.

## 2015-06-10 NOTE — Assessment & Plan Note (Signed)
Although there are clearly abnormalities on CT scan, they should probably be considered "microscopic" since would never be  obvious on plain cxr .     In the setting of obvious "macroscopic" health issues,  I am very reluctatnt to embark on an invasive w/u at this point but will arrange consevative  follow up and in the meantime see what we can do to address the patient's subjective concerns which I strongly doubt have anything to do with the CT  CT results reviewed with pt >>> Too small for PET or bx, not suspicious enough for excisional bx > really only option for now is follow the Fleischner society guidelines as rec by radiology = 1 year f/u/ placed in our tickle file.  Reassured her most likely this are benign.

## 2015-07-10 DIAGNOSIS — J069 Acute upper respiratory infection, unspecified: Secondary | ICD-10-CM | POA: Diagnosis not present

## 2015-07-20 ENCOUNTER — Ambulatory Visit (INDEPENDENT_AMBULATORY_CARE_PROVIDER_SITE_OTHER): Payer: Commercial Managed Care - HMO | Admitting: Internal Medicine

## 2015-07-20 ENCOUNTER — Encounter: Payer: Self-pay | Admitting: Internal Medicine

## 2015-07-20 ENCOUNTER — Other Ambulatory Visit (INDEPENDENT_AMBULATORY_CARE_PROVIDER_SITE_OTHER): Payer: Commercial Managed Care - HMO

## 2015-07-20 ENCOUNTER — Other Ambulatory Visit: Payer: Commercial Managed Care - HMO

## 2015-07-20 VITALS — BP 104/66 | HR 59 | Ht 64.0 in | Wt 139.4 lb

## 2015-07-20 DIAGNOSIS — R058 Other specified cough: Secondary | ICD-10-CM

## 2015-07-20 DIAGNOSIS — R05 Cough: Secondary | ICD-10-CM | POA: Diagnosis not present

## 2015-07-20 DIAGNOSIS — R0600 Dyspnea, unspecified: Secondary | ICD-10-CM | POA: Diagnosis not present

## 2015-07-20 DIAGNOSIS — R918 Other nonspecific abnormal finding of lung field: Secondary | ICD-10-CM | POA: Diagnosis not present

## 2015-07-20 LAB — CBC WITH DIFFERENTIAL/PLATELET
Basophils Absolute: 0 10*3/uL (ref 0.0–0.1)
Basophils Relative: 0.6 % (ref 0.0–3.0)
Eosinophils Absolute: 0.1 10*3/uL (ref 0.0–0.7)
Eosinophils Relative: 2 % (ref 0.0–5.0)
HCT: 39.2 % (ref 36.0–46.0)
Hemoglobin: 12.7 g/dL (ref 12.0–15.0)
LYMPHS ABS: 1.9 10*3/uL (ref 0.7–4.0)
Lymphocytes Relative: 27.3 % (ref 12.0–46.0)
MCHC: 32.5 g/dL (ref 30.0–36.0)
MCV: 84.4 fl (ref 78.0–100.0)
MONOS PCT: 7 % (ref 3.0–12.0)
Monocytes Absolute: 0.5 10*3/uL (ref 0.1–1.0)
NEUTROS ABS: 4.5 10*3/uL (ref 1.4–7.7)
Neutrophils Relative %: 63.1 % (ref 43.0–77.0)
Platelets: 210 10*3/uL (ref 150.0–400.0)
RBC: 4.64 Mil/uL (ref 3.87–5.11)
RDW: 13.4 % (ref 11.5–15.5)
WBC: 7.2 10*3/uL (ref 4.0–10.5)

## 2015-07-20 MED ORDER — PREDNISONE 10 MG PO TABS: ORAL_TABLET | ORAL | Status: DC

## 2015-07-20 MED ORDER — MONTELUKAST SODIUM 10 MG PO TABS: 10.0000 mg | ORAL_TABLET | Freq: Every day | ORAL | Status: DC

## 2015-07-20 NOTE — Patient Instructions (Addendum)
Please remember to go to the lab   department downstairs for your tests - we will call you with the results when they are available.  Add singulair 10 mg each pm for now  For drainage / throat tickle try take CHLORPHENIRAMINE  4 mg - take one every 4 hours as needed - available over the counter- may cause drowsiness so start with just a bedtime dose or two and see how you tolerate it before trying in daytime   (could take 2 at bedtime)   Prednisone 10 mg take  4 each am x 2 days,   2 each am x 2 days,  1 each am x 2 days and stop   Stay on acid suppression until cough is gone for at least 2 weeks without the need for cough meds  Please schedule a follow up office visit in 4 weeks, sooner if needed

## 2015-07-20 NOTE — Progress Notes (Signed)
Subjective:    Patient ID: Tracey Henson, female    DOB: [birth year],     MRN: [id number]    Brief patient profile:  46 yowf never smoker with h/o seasonal rhinitis  X 1980s then intermittent cough mid 90s not correlating with rhinitis may be once or twice a week esp hs then doe progressive x 2016 and can't fast pace walking or hills due to sob > cardiac eval > CT heart > mpns > referred 06/08/2015 by Dr Nancy Fetter to pulmonary clinic.     History of Present Illness  06/08/2015 1st [facility] office visit/ Shyler Holzman   Chief Complaint  Patient presents with  . Pulmonary Consult    Referred by Dr. Nancy Fetter for pulmonary nodules. Pt states that she has had DOE for "as long as I can remember"- gets out of breath pushing a lawn mower. She has had CP and cough that come and go.    onset of cough was indolent x 4 y prior to OV  assoc with sense of pnds daily and at hs but no excess mucus, then midline cp with severe coughing fits that occur once or twice a week assoc with doe and variable HB rec Try prilosec otc 20mg   Take 30-60 min before first meal of the day and Pepcid ac (famotidine) 20 mg one @  bedtime  Until return  For drainage take chlortrimeton (chlorpheniramine) 4 mg every 4 hours available over the counter (may cause drowsiness)  GERD  If still coughing you will need tessilon 200 mg up to 4 hours    07/20/2015  f/u ov/Tuwanda Vokes re: uacs / mpns Chief Complaint  Patient presents with  . Follow-up    Pt states her breathing is unchanged.  She is still coughing and constantly clearing her throat.   Using loratidine twice daily in fall season x sev years and still sense of excessive daytime only pnds Chronic doe = MMRC1 = can walk nl pace, flat grade, can't hurry or go uphills or steps s sob    No obvious day to day or daytime variability or assoc cp or chest tightness, subjective wheeze or overt sinus or hb symptoms. No unusual exp hx or h/o childhood pna/ asthma or knowledge of premature  birth.  Sleeping ok without nocturnal  or early am exacerbation  of respiratory  c/o's or need for noct saba. Also denies any obvious fluctuation of symptoms with weather or environmental changes or other aggravating or alleviating factors except as outlined above   Current Medications, Allergies, Complete Past Medical History, Past Surgical History, Family History, and Social History were reviewed in [employer] record.  ROS  The following are not active complaints unless bolded sore throat, dysphagia, dental problems, itching, sneezing,  nasal congestion or excess/ purulent secretions, ear ache,   fever, chills, sweats, unintended wt loss, classically pleuritic or exertional cp, hemoptysis,  orthopnea pnd or leg swelling, presyncope, palpitations, abdominal pain, anorexia, nausea, vomiting, diarrhea  or change in bowel or bladder habits, change in stools or urine, dysuria,hematuria,  rash, arthralgias, visual complaints, headache, numbness, weakness or ataxia or problems with walking or coordination,  change in mood/affect or memory.              Objective:   Physical Exam   Pleasant amb wf with freq throat clearing  07/20/2015         139  Wt Readings from Last 3 Encounters:  06/08/15 140 lb (63.504 kg)  05/22/15  140 lb (63.504 kg)  07/04/14 138 lb (62.596 kg)    Vital signs reviewed   HEENT: nl dentition, turbinates, and orophanx. Nl external ear canals without cough reflex   NECK :  without JVD/Nodes/TM/ nl carotid upstrokes bilaterally   LUNGS: no acc muscle use, clear to A and P bilaterally without cough on insp or exp maneuvers   CV:  RRR  no s3 or murmur or increase in P2, no edema   ABD:  soft and nontender with nl excursion in the supine position. No bruits or organomegaly, bowel sounds nl  MS:  warm without deformities, calf tenderness, cyanosis or clubbing  SKIN: warm and dry without lesions    NEURO:  alert, approp, no deficits     CT chest   05/22/15  mpns x 4 mm   Labs ordered 07/20/2015  Allergy profile      Assessment & Plan:   Outpatient Encounter Prescriptions as of 07/20/2015  Medication Sig  . atorvastatin (LIPITOR) 40 MG tablet Take 1 tablet (40 mg total) by mouth daily.  . benzonatate (TESSALON) 200 MG capsule One four times daily as needed for cough  . BIOTIN PO Take 1 capsule by mouth daily.  . Cyanocobalamin (VITAMIN B 12 PO) Take 1 tablet by mouth daily.  Marland Kitchen loratadine (CLARITIN) 10 MG tablet Take 10 mg by mouth 2 (two) times daily.  Marland Kitchen omeprazole (PRILOSEC) 20 MG capsule Take 20 mg by mouth daily.  . Probiotic Product (PROBIOTIC PO) Take 1 capsule by mouth daily.  . montelukast (SINGULAIR) 10 MG tablet Take 1 tablet (10 mg total) by mouth at bedtime.  . predniSONE (DELTASONE) 10 MG tablet Take  4 each am x 2 days,   2 each am x 2 days,  1 each am x 2 days and stop   No facility-administered encounter medications on file as of 07/20/2015.

## 2015-07-21 LAB — ALLERGY FULL PROFILE
Allergen, D pternoyssinus,d7: <0.1 kU/L
Allergen,Goose feathers, e70: <0.1 kU/L
Bermuda Grass: <0.1 kU/L
Box Elder IgE: <0.1 kU/L
Candida Albicans: <0.1 kU/L
Cat Dander: <0.1 kU/L
Common Ragweed: <0.1 kU/L
Curvularia lunata: <0.1 kU/L
Elm IgE: <0.1 kU/L
Fescue: <0.1 kU/L
Helminthosporium halodes: <0.1 kU/L
House Dust Hollister: <0.1 kU/L
IgE (Immunoglobulin E), Serum: 10 kU/L (ref <115)
Lamb's Quarters: <0.1 kU/L
Oak: <0.1 kU/L
Plantain: <0.1 kU/L
Stemphylium Botryosum: <0.1 kU/L

## 2015-07-22 NOTE — Progress Notes (Signed)
Quick Note:  Spoke with pt and notified of results per Dr. Wert. Pt verbalized understanding and denied any questions.  ______ 

## 2015-07-23 DIAGNOSIS — B300 Keratoconjunctivitis due to adenovirus: Secondary | ICD-10-CM | POA: Diagnosis not present

## 2015-07-23 DIAGNOSIS — Z0100 Encounter for examination of eyes and vision without abnormal findings: Secondary | ICD-10-CM | POA: Diagnosis not present

## 2015-07-25 NOTE — Assessment & Plan Note (Addendum)
CT chest   05/22/15  mpns x 4 mm > tickle file for 05/24/16   Strongly doubt this has anything to do with her cough or sob and most likely benign  Discussed in detail all the  indications, usual  risks and alternatives  relative to the benefits with patient who agrees to proceed with conservative f/u as outlined

## 2015-07-25 NOTE — Assessment & Plan Note (Addendum)
Allergy profile 07/20/2015  >  IgE 10, neg RAST, Eos  0.1  - start singulair 10 mg 07/20/2015   Still strongly favor  Classic Upper airway cough syndrome, so named because it's frequently impossible to sort out how much is  CR/sinusitis with freq throat clearing (which can be related to primary GERD)   vs  causing  secondary (" extra esophageal")  GERD from wide swings in gastric pressure that occur with throat clearing, often  promoting self use of mint and menthol lozenges that reduce the lower esophageal sphincter tone and exacerbate the problem further in a cyclical fashion.   These are the same pts (now being labeled as having "irritable larynx syndrome" by some cough centers) who not infrequently have a history of having failed to tolerate ace inhibitors,  dry powder inhalers or biphosphonates or report having atypical reflux symptoms that don't respond to standard doses of PPI , and are easily confused as having aecopd or asthma flares by even experienced allergists/ pulmonologists.   rec trial of singulair and 1st gen h1 p very short pred trial   I had an extended discussion with the patient reviewing all relevant studies completed to date and  lasting 15 to 20 minutes of a 25 minute visit    Each maintenance medication was reviewed in detail including most importantly the difference between maintenance and prns and under what circumstances the prns are to be triggered using an action plan format that is not reflected in the computer generated alphabetically organized AVS.    Please see instructions for details which were reviewed in writing and the patient given a copy highlighting the part that I personally wrote and discussed at today's ov.

## 2015-07-25 NOTE — Assessment & Plan Note (Signed)
-   06/08/2015  Walked RA x 3 laps @ 185 ft each stopped due to End of study, fast pace, min sob no desat     No obvious explanation for cpst > will work on eliminating the throat clearing first then consider cpst with spirometry before and after

## 2015-08-01 ENCOUNTER — Encounter (HOSPITAL_BASED_OUTPATIENT_CLINIC_OR_DEPARTMENT_OTHER): Payer: Self-pay | Admitting: *Deleted

## 2015-08-01 ENCOUNTER — Emergency Department (HOSPITAL_BASED_OUTPATIENT_CLINIC_OR_DEPARTMENT_OTHER): Payer: Commercial Managed Care - HMO

## 2015-08-01 ENCOUNTER — Emergency Department (HOSPITAL_BASED_OUTPATIENT_CLINIC_OR_DEPARTMENT_OTHER)
Admission: EM | Admit: 2015-08-01 | Discharge: 2015-08-01 | Disposition: A | Discharge status: Home / Self Care | Payer: Commercial Managed Care - HMO | Attending: Emergency Medicine | Admitting: Emergency Medicine

## 2015-08-01 DIAGNOSIS — S93402A Sprain of unspecified ligament of left ankle, initial encounter: Secondary | ICD-10-CM | POA: Diagnosis not present

## 2015-08-01 DIAGNOSIS — S99922A Unspecified injury of left foot, initial encounter: Secondary | ICD-10-CM | POA: Diagnosis not present

## 2015-08-01 DIAGNOSIS — Z8739 Personal history of other diseases of the musculoskeletal system and connective tissue: Secondary | ICD-10-CM | POA: Insufficient documentation

## 2015-08-01 DIAGNOSIS — Z8742 Personal history of other diseases of the female genital tract: Secondary | ICD-10-CM | POA: Diagnosis not present

## 2015-08-01 DIAGNOSIS — Y9389 Activity, other specified: Secondary | ICD-10-CM | POA: Insufficient documentation

## 2015-08-01 DIAGNOSIS — Y9289 Other specified places as the place of occurrence of the external cause: Secondary | ICD-10-CM | POA: Diagnosis not present

## 2015-08-01 DIAGNOSIS — E7800 Pure hypercholesterolemia, unspecified: Secondary | ICD-10-CM | POA: Diagnosis not present

## 2015-08-01 DIAGNOSIS — W010XXA Fall on same level from slipping, tripping and stumbling without subsequent striking against object, initial encounter: Secondary | ICD-10-CM | POA: Insufficient documentation

## 2015-08-01 DIAGNOSIS — Y998 Other external cause status: Secondary | ICD-10-CM | POA: Diagnosis not present

## 2015-08-01 DIAGNOSIS — Z79899 Other long term (current) drug therapy: Secondary | ICD-10-CM | POA: Insufficient documentation

## 2015-08-01 DIAGNOSIS — S93401A Sprain of unspecified ligament of right ankle, initial encounter: Secondary | ICD-10-CM | POA: Diagnosis not present

## 2015-08-01 DIAGNOSIS — S99912A Unspecified injury of left ankle, initial encounter: Secondary | ICD-10-CM | POA: Diagnosis not present

## 2015-08-01 DIAGNOSIS — M25572 Pain in left ankle and joints of left foot: Secondary | ICD-10-CM | POA: Diagnosis not present

## 2015-08-01 MED ORDER — IBUPROFEN 400 MG PO TABS
600.0000 mg | ORAL_TABLET | Freq: Once | ORAL | Status: AC
  Administered 2015-08-01: 600 mg via ORAL
  Filled 2015-08-01 (×2): qty 1

## 2015-08-01 MED ORDER — HYDROCODONE-ACETAMINOPHEN 5-325 MG PO TABS: ORAL_TABLET | ORAL | Status: DC

## 2015-08-01 NOTE — ED Provider Notes (Signed)
CSN: [id number]     Arrival date & time 08/01/15  1924 History   First MD Initiated Contact with Patient 08/01/15 2018     Chief Complaint  Patient presents with  . Ankle Henson     (Consider location/radiation/quality/duration/timing/severity/associated sxs/prior Treatment) HPI   Blood pressure 146/62, pulse 72, temperature 97.7 F (36.5 C), temperature source Oral, resp. rate 18, height 5\' 4"  (1.626 m), weight 138 lb (62.596 kg), SpO2 99 %.  Tracey Henson is a 64 y.o. female complaining of left lateral ankle Henson after patient twisted the ankle when tripping over a rug. There is no other trauma. Patient is ambulatory but with Henson. No Henson medication taken prior to arrival. Henson is moderate, 7 out of 10 and exacerbated by movement, palpation and weightbearing. Patient follows at [facility] orthopedic  Past Medical History  Diagnosis Date  . Breast cyst   . Baker cyst   . Hypercholesteremia    Past Surgical History  Procedure Laterality Date  . Abdominal hysterectomy    . Breast surgery    . Temporomandibular joint surgery    . Appendectomy     Family History  Problem Relation Age of Onset  . Lung cancer Mother     smoked  . Heart attack Mother   . Coronary artery disease Mother   . Heart attack Father   . Coronary artery disease Father   . Heart disease Sister   . Stroke Maternal Grandmother   . Heart attack Maternal Grandfather   . Coronary artery disease Maternal Grandfather    Social History  Substance Use Topics  . Smoking status: Never Smoker   . Smokeless tobacco: Never Used  . Alcohol Use: No     Comment: Occassional wine   OB History    No data available     Review of Systems  10 systems reviewed and found to be negative, except as noted in the HPI.   Allergies  Cefaclor; Codeine; and Crestor  Home Medications   Prior to Admission medications   Medication Sig Start Date End Date Taking? Authorizing Provider  atorvastatin (LIPITOR) 40 MG tablet  Take 1 tablet (40 mg total) by mouth daily. 05/28/15  Yes Tracey Pain, MD  benzonatate (TESSALON) 200 MG capsule One four times daily as needed for cough 06/08/15  Yes Tracey Rockers, MD  BIOTIN PO Take 1 capsule by mouth daily.   Yes Historical Provider, MD  Cyanocobalamin (VITAMIN B 12 PO) Take 1 tablet by mouth daily.   Yes Historical Provider, MD  loratadine (CLARITIN) 10 MG tablet Take 10 mg by mouth 2 (two) times daily.   Yes Historical Provider, MD  montelukast (SINGULAIR) 10 MG tablet Take 1 tablet (10 mg total) by mouth at bedtime. 07/20/15  Yes Tracey Rockers, MD  omeprazole (PRILOSEC) 20 MG capsule Take 20 mg by mouth daily.   Yes Historical Provider, MD  Probiotic Product (PROBIOTIC PO) Take 1 capsule by mouth daily.   Yes Historical Provider, MD  HYDROcodone-acetaminophen (NORCO/VICODIN) 5-325 MG tablet Take 1-2 tablets by mouth every 6 hours as needed for Henson and/or cough. 08/01/15   Tracey Lippard, PA-C  predniSONE (DELTASONE) 10 MG tablet Take  4 each am x 2 days,   2 each am x 2 days,  1 each am x 2 days and stop 07/20/15   Tracey Rockers, MD   BP 146/62 mmHg  Pulse 72  Temp(Src) 97.7 F (36.5 C) (Oral)  Resp 18  Ht  5\' 4"  (1.626 m)  Wt 138 lb (62.596 kg)  BMI 23.68 kg/m2  SpO2 99% Physical Exam  Constitutional: She is oriented to person, place, and time. She appears well-developed and well-nourished. No distress.  HENT:  Head: Normocephalic.  Eyes: Conjunctivae and EOM are normal.  Cardiovascular: Normal rate.   Pulmonary/Chest: Effort normal. No stridor.  Musculoskeletal: Normal range of motion. She exhibits edema and tenderness.  Left ankle: No deformity, no overlying skin changes, mild swelling and tenderness to palpation along the inferior, lateral malleolus. No bony tenderness palpation, distally neurovascularly intact.   Neurological: She is alert and oriented to person, place, and time.  Psychiatric: She has a normal mood and affect.  Nursing note and vitals  reviewed.   ED Course  Procedures (including critical care time) Labs Review Labs Reviewed - No data to display  Imaging Review Dg Ankle Complete Left  08/01/2015  CLINICAL DATA:  The patient tripped and fell with a twisting injury of the left ankle today. Initial encounter. EXAM: LEFT ANKLE COMPLETE - 3+ VIEW COMPARISON:  None. FINDINGS: No soft tissue swelling is seen about the ankle. The patient has a fracture of the medial malleolus but its margins appear corticated most consistent with remote injury. Imaged bones otherwise appear normal. IMPRESSION: Nondisplaced medial malleolar fracture appears remote given absence of soft tissue swelling and corticated margins. The examination is otherwise negative. Electronically Signed   By: Inge Rise M.D.   On: 08/01/2015 20:33   Dg Foot Complete Left  08/01/2015  CLINICAL DATA:  Status post trip and fall, twisting left ankle, with left ankle and left foot Henson. Initial encounter. EXAM: LEFT FOOT - COMPLETE 3+ VIEW COMPARISON:  None. FINDINGS: There is no evidence of fracture or dislocation. The joint spaces are preserved. There is no evidence of talar subluxation; the subtalar joint is unremarkable in appearance. An os peroneum is noted. No significant soft tissue abnormalities are seen. IMPRESSION: 1. No definite evidence of fracture or dislocation. 2. Os peroneum noted. Electronically Signed   By: Garald Balding M.D.   On: 08/01/2015 20:32   I have personally reviewed and evaluated these images and lab results as part of my medical decision-making.   EKG Interpretation None      MDM   Final diagnoses:  Ankle sprain, right, initial encounter    Filed Vitals:   08/01/15 1929  BP: 146/62  Pulse: 72  Temp: 97.7 F (36.5 C)  TempSrc: Oral  Resp: 18  Height: 5\' 4"  (1.626 m)  Weight: 138 lb (62.596 kg)  SpO2: 99%    Medications  ibuprofen (ADVIL,MOTRIN) tablet 600 mg (600 mg Oral Given 08/01/15 2034)    Tracey Henson is 65  y.o. female presenting with ankle Henson. Patient is ambulatory. No point tenderness or bony tenderness to palpation. Neurovascularly intact. X-rays negative. They do note a remote medial malleolus fracture. This is not with the patient has Henson. Patient was not aware that she fractured this in the past. Patient will be given crutches, Ace wrap, and advised to maintain RICE.   Evaluation does not show pathology that would require ongoing emergent intervention or inpatient treatment. Pt is hemodynamically stable and mentating appropriately. Discussed findings and plan with patient/guardian, who agrees with care plan. All questions answered. Return precautions discussed and outpatient follow up given.   New Prescriptions   HYDROCODONE-ACETAMINOPHEN (NORCO/VICODIN) 5-325 MG TABLET    Take 1-2 tablets by mouth every 6 hours as needed for Henson and/or cough.  Monico Blitz, PA-C 08/01/15 2051  Dorie Rank, MD 08/02/15 1538

## 2015-08-01 NOTE — Discharge Instructions (Signed)
Rest, Ice intermittently (in the first 24-48 hours), Gentle compression with an Ace wrap, and elevate (Limb above the level of the heart) °  °Take up to 800mg of ibuprofen (that is usually 4 over the counter pills)  3 times a day for 5 days. Take with food. ° °Take vicodin for breakthrough pain, do not drink alcohol, drive, care for children or do other critical tasks while taking vicodin. ° °Please follow with your primary care doctor in the next 2 days for a check-up. They must obtain records for further management.  ° °Do not hesitate to return to the Emergency Department for any new, worsening or concerning symptoms.  ° ° °Ankle Sprain °An ankle sprain is an injury to the strong, fibrous tissues (ligaments) that hold the bones of your ankle joint together.  °CAUSES °An ankle sprain is usually caused by a fall or by twisting your ankle. Ankle sprains most commonly occur when you step on the outer edge of your foot, and your ankle turns inward. People who participate in sports are more prone to these types of injuries.  °SYMPTOMS  °· Pain in your ankle. The pain may be present at rest or only when you are trying to stand or walk. °· Swelling. °· Bruising. Bruising may develop immediately or within 1 to 2 days after your injury. °· Difficulty standing or walking, particularly when turning corners or changing directions. °DIAGNOSIS  °Your caregiver will ask you details about your injury and perform a physical exam of your ankle to determine if you have an ankle sprain. During the physical exam, your caregiver will press on and apply pressure to specific areas of your foot and ankle. Your caregiver will try to move your ankle in certain ways. An X-ray exam may be done to be sure a bone was not broken or a ligament did not separate from one of the bones in your ankle (avulsion fracture).  °TREATMENT  °Certain types of braces can help stabilize your ankle. Your caregiver can make a recommendation for this. Your  caregiver may recommend the use of medicine for pain. If your sprain is severe, your caregiver may refer you to a surgeon who helps to restore function to parts of your skeletal system (orthopedist) or a physical therapist. °HOME CARE INSTRUCTIONS  °· Apply ice to your injury for 1-2 days or as directed by your caregiver. Applying ice helps to reduce inflammation and pain. °¨ Put ice in a plastic bag. °¨ Place a towel between your skin and the bag. °¨ Leave the ice on for 15-20 minutes at a time, every 2 hours while you are awake. °· Only take over-the-counter or prescription medicines for pain, discomfort, or fever as directed by your caregiver. °· Elevate your injured ankle above the level of your heart as much as possible for 2-3 days. °· If your caregiver recommends crutches, use them as instructed. Gradually put weight on the affected ankle. Continue to use crutches or a cane until you can walk without feeling pain in your ankle. °· If you have a plaster splint, wear the splint as directed by your caregiver. Do not rest it on anything harder than a pillow for the first 24 hours. Do not put weight on it. Do not get it wet. You may take it off to take a shower or bath. °· You may have been given an elastic bandage to wear around your ankle to provide support. If the elastic bandage is too tight (you have numbness   or tingling in your foot or your foot becomes cold and blue), adjust the bandage to make it comfortable.  If you have an air splint, you may blow more air into it or let air out to make it more comfortable. You may take your splint off at night and before taking a shower or bath. Wiggle your toes in the splint several times per day to decrease swelling. SEEK MEDICAL CARE IF:   You have rapidly increasing bruising or swelling.  Your toes feel extremely cold or you lose feeling in your foot.  Your pain is not relieved with medicine. SEEK IMMEDIATE MEDICAL CARE IF:  Your toes are numb or  blue.  You have severe pain that is increasing. MAKE SURE YOU:   Understand these instructions.  Will watch your condition.  Will get help right away if you are not doing well or get worse.   This information is not intended to replace advice given to you by your health care provider. Make sure you discuss any questions you have with your health care provider.   Document Released: 09/05/2005 Document Revised: 09/26/2014 Document Reviewed: 09/17/2011 Elsevier Interactive Patient Education [employer].

## 2015-08-01 NOTE — ED Notes (Signed)
Pt stated she did not want crutches due to the pain that she has on her wrist. I informed Pt that I would inform her RN Alyse Low.

## 2015-08-01 NOTE — ED Notes (Signed)
Pt refused crutches due to a wrist injury that impairs her ability to put weight on it (EDP aware). Pt states she has no steps in her home and can get inside with the help of family and friends (pt transferred to wheelchair with standby assistance). Pt also states a family member is in the process of getting her a knee scooter for mobility.

## 2015-08-01 NOTE — ED Notes (Signed)
Pt reports she tripped and fell twisting her left ankle- ice applied in triage

## 2015-08-18 ENCOUNTER — Encounter: Payer: Self-pay | Admitting: Internal Medicine

## 2015-08-18 ENCOUNTER — Ambulatory Visit (INDEPENDENT_AMBULATORY_CARE_PROVIDER_SITE_OTHER): Payer: Commercial Managed Care - HMO | Admitting: Internal Medicine

## 2015-08-18 VITALS — BP 108/66 | HR 60 | Ht 61.0 in | Wt 141.8 lb

## 2015-08-18 DIAGNOSIS — R05 Cough: Secondary | ICD-10-CM

## 2015-08-18 DIAGNOSIS — R058 Other specified cough: Secondary | ICD-10-CM

## 2015-08-18 NOTE — Progress Notes (Signed)
Subjective:    Patient ID: Nino Glow, female    DOB: Apr 13, 1951,     MRN: [id number]    Brief patient profile:  16 yowf never smoker with h/o seasonal rhinitis  X 1980s then intermittent cough mid 90s not correlating with rhinitis may be once or twice a week esp hs then doe progressive x 2016 and can't fast pace walking or hills due to sob > cardiac eval > CT heart > mpns > referred 06/08/2015 by Dr Nancy Fetter to pulmonary clinic.     History of Present Illness  06/08/2015 1st [facility] office visit/ Shamari Lofquist   Chief Complaint  Patient presents with  . Pulmonary Consult    Referred by Dr. Nancy Fetter for pulmonary nodules. Pt states that she has had DOE for "as long as I can remember"- gets out of breath pushing a lawn mower. She has had CP and cough that come and go.    onset of cough was indolent x 4 y prior to OV  assoc with sense of pnds daily and at hs but no excess mucus, then midline cp with severe coughing fits that occur once or twice a week assoc with doe and variable HB rec Try prilosec otc 20mg   Take 30-60 min before first meal of the day and Pepcid ac (famotidine) 20 mg one @  bedtime  Until return  For drainage take chlortrimeton (chlorpheniramine) 4 mg every 4 hours available over the counter (may cause drowsiness)  GERD diet  If still coughing you will need tessilon 200 mg up to 4 hours as needed    07/20/2015  f/u ov/Mung Rinker re: uacs / mpns Chief Complaint  Patient presents with  . Follow-up    Pt states her breathing is unchanged.  She is still coughing and constantly clearing her throat.   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 Add singulair 10 mg each pm for now For drainage / throat  tickle try take CHLORPHENIRAMINE  4 mg - take one every 4 hours as needed - available over the counter- may cause drowsiness so start with just a bedtime dose or two and see how you tolerate it before trying in daytime   (could take 2 at bedtime)  Prednisone 10 mg take  4 each am x 2 days,   2 each am x 2 days,  1 each am x 2 days and stop  Stay on acid suppression until cough is gone for at least 2 weeks without the need for cough meds   08/18/2015  f/u ov/Marian Meneely re: cough better on prilosec /singulair / antihistamines Chief Complaint  Patient presents with  . Follow-up    Pt states her cough has improved some. She is not clearing her throat as often. No new co's today.   breathing not an issue as long as doesn't push too hard  Noted more throat clearing when missed a dose of ppi Overall much better vs baseline     No obvious day to day or daytime variability or assoc cp or chest tightness, subjective wheeze or overt sinus or hb symptoms. No unusual exp hx or h/o childhood pna/  asthma or knowledge of premature birth.  Sleeping ok without nocturnal  or early am exacerbation  of respiratory  c/o's or need for noct saba. Also denies any obvious fluctuation of symptoms with weather or environmental changes or other aggravating or alleviating factors except as outlined above   Current Medications, Allergies, Complete Past Medical History, Past Surgical History, Family History, and Social History were reviewed in [employer] record.  ROS  The following are not active complaints unless bolded sore throat, dysphagia, dental problems, itching, sneezing,  nasal congestion or excess/ purulent secretions, ear ache,   fever, chills, sweats, unintended wt loss, classically pleuritic or exertional cp, hemoptysis,  orthopnea pnd or leg swelling, presyncope, palpitations, abdominal pain, anorexia, nausea, vomiting, diarrhea  or change in bowel or bladder habits, change in stools or urine,  dysuria,hematuria,  rash, arthralgias, visual complaints, headache, numbness, weakness or ataxia or problems with walking or coordination,  change in mood/affect or memory.              Objective:   Physical Exam   Pleasant amb wf  nad   07/20/2015         139 >  08/18/2015 142     06/08/15 140 lb (63.504 kg)  05/22/15 140 lb (63.504 kg)  07/04/14 138 lb (62.596 kg)    Vital signs reviewed   HEENT: nl dentition, turbinates, and orophanx. Nl external ear canals without cough reflex   NECK :  without JVD/Nodes/TM/ nl carotid upstrokes bilaterally   LUNGS: no acc muscle use, clear to A and P bilaterally without cough on insp or exp maneuvers   CV:  RRR  no s3 or murmur or increase in P2, no edema   ABD:  soft and nontender with nl excursion in the supine position. No bruits or organomegaly, bowel sounds nl  MS:  warm without deformities, calf tenderness, cyanosis or clubbing  SKIN: warm and dry without lesions    NEURO:  alert, approp, no deficits    CT chest   05/22/15  mpns x 4 mm         Assessment & Plan:

## 2015-08-18 NOTE — Patient Instructions (Signed)
No change in meds x 3 months   If doing great > try pepcid 20 mg after bfast and supper   Please schedule a follow up visit in 3 months but call sooner if needed

## 2015-08-18 NOTE — Assessment & Plan Note (Signed)
Allergy profile 07/20/2015 >  IgE 10, neg RAST, Eos  0.1  - start singulair 10 mg 07/20/2015 > improved on this plus gerd rx   Cough better on present rx   Discussed the recent press about ppi's in the context of a statistically significant (but questionably clinically relevant) increase in CRI in pts on ppi vs h2's > bottom line is the lowest dose of ppi that controls   gerd is the right dose and if that dose is zero that's fine esp since h2's are cheaper.   F/u q 3 m

## 2015-08-25 ENCOUNTER — Telehealth: Payer: Self-pay | Admitting: Pharmacist

## 2015-08-25 ENCOUNTER — Other Ambulatory Visit (INDEPENDENT_AMBULATORY_CARE_PROVIDER_SITE_OTHER): Payer: Commercial Managed Care - HMO | Admitting: *Deleted

## 2015-08-25 DIAGNOSIS — E785 Hyperlipidemia, unspecified: Secondary | ICD-10-CM | POA: Diagnosis not present

## 2015-08-25 LAB — LIPID PANEL
CHOLESTEROL: 134 mg/dL (ref 125–200)
HDL: 42 mg/dL — ABNORMAL LOW (ref >=46)
LDL Cholesterol: 77 mg/dL (ref <130)
TRIGLYCERIDES: 76 mg/dL (ref <150)
Total CHOL/HDL Ratio: 3.2 Ratio (ref <=5.0)
VLDL: 15 mg/dL (ref <30)

## 2015-08-25 LAB — HEPATIC FUNCTION PANEL
ALBUMIN: 3.7 g/dL (ref 3.6–5.1)
ALT: 24 U/L (ref 6–29)
AST: 23 U/L (ref 10–35)
Alkaline Phosphatase: 53 U/L (ref 33–130)
BILIRUBIN DIRECT: 0.1 mg/dL (ref <=0.2)
BILIRUBIN TOTAL: 0.5 mg/dL (ref 0.2–1.2)
Indirect Bilirubin: 0.4 mg/dL (ref 0.2–1.2)
Total Protein: 6.6 g/dL (ref 6.1–8.1)

## 2015-08-25 NOTE — Addendum Note (Signed)
Addended by: Eulis Foster on: 08/25/2015 07:46 AM   Modules accepted: Orders

## 2015-08-25 NOTE — Telephone Encounter (Signed)
Patient was scheduled for f/u in lipid clinic on 12/7. Had lipids drawn 12/6. Called patient to let her know her LDL is now at goal <100 (strong family history, elevated LDL particle number). LDL has decreased from 153 to 77 on Lipitor 40mg  which was started 3 months ago in clinic. She is tolerating Lipitor well with no side effects. Patient will continue current therapy. Canceled clinic visit tomorrow in lipid clinic since pt at goal. Patient gave verbal agreement.

## 2015-08-26 ENCOUNTER — Ambulatory Visit: Payer: Commercial Managed Care - HMO | Admitting: Pharmacist

## 2015-10-02 DIAGNOSIS — J069 Acute upper respiratory infection, unspecified: Secondary | ICD-10-CM | POA: Diagnosis not present

## 2015-10-22 ENCOUNTER — Other Ambulatory Visit: Payer: Self-pay | Admitting: Family Medicine

## 2015-10-22 ENCOUNTER — Ambulatory Visit
Admission: RE | Admit: 2015-10-22 | Discharge: 2015-10-22 | Disposition: A | Discharge status: Home / Self Care | Payer: Commercial Managed Care - HMO | Source: Ambulatory Visit | Attending: Family Medicine | Admitting: Family Medicine

## 2015-10-22 DIAGNOSIS — R1011 Right upper quadrant pain: Secondary | ICD-10-CM

## 2015-10-22 DIAGNOSIS — R1013 Epigastric pain: Secondary | ICD-10-CM

## 2015-10-22 DIAGNOSIS — R109 Unspecified abdominal pain: Secondary | ICD-10-CM | POA: Diagnosis not present

## 2015-10-26 ENCOUNTER — Emergency Department (HOSPITAL_COMMUNITY)
Admission: EM | Admit: 2015-10-26 | Discharge: 2015-10-27 | Disposition: A | Discharge status: Home / Self Care | Payer: Commercial Managed Care - HMO | Attending: Emergency Medicine | Admitting: Emergency Medicine

## 2015-10-26 ENCOUNTER — Emergency Department (HOSPITAL_COMMUNITY): Payer: Commercial Managed Care - HMO

## 2015-10-26 ENCOUNTER — Telehealth: Payer: Self-pay | Admitting: Internal Medicine

## 2015-10-26 ENCOUNTER — Encounter (HOSPITAL_COMMUNITY): Payer: Self-pay

## 2015-10-26 DIAGNOSIS — R0789 Other chest pain: Secondary | ICD-10-CM | POA: Diagnosis not present

## 2015-10-26 DIAGNOSIS — R05 Cough: Secondary | ICD-10-CM | POA: Diagnosis not present

## 2015-10-26 DIAGNOSIS — R079 Chest pain, unspecified: Secondary | ICD-10-CM | POA: Insufficient documentation

## 2015-10-26 DIAGNOSIS — Z8742 Personal history of other diseases of the female genital tract: Secondary | ICD-10-CM | POA: Diagnosis not present

## 2015-10-26 DIAGNOSIS — Z8739 Personal history of other diseases of the musculoskeletal system and connective tissue: Secondary | ICD-10-CM | POA: Insufficient documentation

## 2015-10-26 DIAGNOSIS — Z79899 Other long term (current) drug therapy: Secondary | ICD-10-CM | POA: Insufficient documentation

## 2015-10-26 DIAGNOSIS — K219 Gastro-esophageal reflux disease without esophagitis: Secondary | ICD-10-CM | POA: Diagnosis not present

## 2015-10-26 DIAGNOSIS — E7800 Pure hypercholesterolemia, unspecified: Secondary | ICD-10-CM | POA: Diagnosis not present

## 2015-10-26 LAB — CBC
HCT: 38.1 % (ref 36.0–46.0)
HEMOGLOBIN: 12.5 g/dL (ref 12.0–15.0)
MCH: 27.7 pg (ref 26.0–34.0)
MCHC: 32.8 g/dL (ref 30.0–36.0)
MCV: 84.3 fL (ref 78.0–100.0)
Platelets: 207 10*3/uL (ref 150–400)
RBC: 4.52 MIL/uL (ref 3.87–5.11)
RDW: 13.3 % (ref 11.5–15.5)
WBC: 7.2 10*3/uL (ref 4.0–10.5)

## 2015-10-26 LAB — URINALYSIS, ROUTINE W REFLEX MICROSCOPIC
BILIRUBIN URINE: NEGATIVE
Glucose, UA: NEGATIVE mg/dL
HGB URINE DIPSTICK: NEGATIVE
Ketones, ur: NEGATIVE mg/dL
Leukocytes, UA: NEGATIVE
NITRITE: NEGATIVE
PROTEIN: NEGATIVE mg/dL
Specific Gravity, Urine: 1.023 (ref 1.005–1.030)
pH: 5 (ref 5.0–8.0)

## 2015-10-26 LAB — I-STAT CHEM 8, ED
BUN: 16 mg/dL (ref 6–20)
Calcium, Ion: 1.24 mmol/L (ref 1.13–1.30)
Chloride: 107 mmol/L (ref 101–111)
Creatinine, Ser: 0.7 mg/dL (ref 0.44–1.00)
Glucose, Bld: 168 mg/dL — ABNORMAL HIGH (ref 65–99)
HEMATOCRIT: 39 % (ref 36.0–46.0)
HEMOGLOBIN: 13.3 g/dL (ref 12.0–15.0)
POTASSIUM: 3.7 mmol/L (ref 3.5–5.1)
SODIUM: 144 mmol/L (ref 135–145)
TCO2: 27 mmol/L (ref 0–100)

## 2015-10-26 LAB — AMYLASE: Amylase: 67 U/L (ref 28–100)

## 2015-10-26 LAB — LIPASE, BLOOD: LIPASE: 38 U/L (ref 11–51)

## 2015-10-26 LAB — I-STAT CG4 LACTIC ACID, ED: Lactic Acid, Venous: 1.35 mmol/L (ref 0.5–2.0)

## 2015-10-26 LAB — I-STAT TROPONIN, ED: Troponin i, poc: 0 ng/mL (ref 0.00–0.08)

## 2015-10-26 NOTE — ED Notes (Signed)
Bed: [id number] Expected date:  Expected time:  Means of arrival:  Comments: TRIAGE 2

## 2015-10-26 NOTE — ED Notes (Signed)
Pt complains of chest pain that is across her mid chest that radiates up her right shoulder and into her back, has been evaluated at her doctor and has had several tests ran. Pt states that tonight the pain is much worse than it has been.

## 2015-10-26 NOTE — Telephone Encounter (Signed)
Spoke with pt. States that she is having pain her chest that is radiating to her right shoulder blade. Reports that the pain starts at the base of her ribs. States, "If I could burp I think I would feel better." Denies chest tightness, wheezing, SOB or cough. Pt does not know who she needs to contact about this that is why she contacted [place]. She would like MW's recommendations.  MW - please advise. Thanks.

## 2015-10-26 NOTE — Telephone Encounter (Signed)
I have not seen her for cp but called to find out more about it and go the recorder  Try her again and let her know it's hard to evaluate new cp over the phone and really should be seen by primary care or urgent care or ER

## 2015-10-26 NOTE — Telephone Encounter (Signed)
Pt is aware of MW's recommendation. Nothing further was needed. 

## 2015-10-26 NOTE — Telephone Encounter (Signed)
Pt returning call.Tracey Henson ° °

## 2015-10-26 NOTE — Telephone Encounter (Signed)
lmtcb x1 for pt. 

## 2015-10-27 LAB — HEPATIC FUNCTION PANEL
ALBUMIN: 4.2 g/dL (ref 3.5–5.0)
ALT: 40 U/L (ref 14–54)
AST: 33 U/L (ref 15–41)
Alkaline Phosphatase: 76 U/L (ref 38–126)
BILIRUBIN INDIRECT: 0.3 mg/dL (ref 0.3–0.9)
BILIRUBIN TOTAL: 0.4 mg/dL (ref 0.3–1.2)
Bilirubin, Direct: 0.1 mg/dL (ref 0.1–0.5)
TOTAL PROTEIN: 7.1 g/dL (ref 6.5–8.1)

## 2015-10-27 LAB — I-STAT TROPONIN, ED: TROPONIN I, POC: 0 ng/mL (ref 0.00–0.08)

## 2015-10-27 LAB — D-DIMER, QUANTITATIVE (NOT AT ARMC)

## 2015-10-27 MED ORDER — HYDROCODONE-ACETAMINOPHEN 5-325 MG PO TABS
2.0000 | ORAL_TABLET | Freq: Once | ORAL | Status: AC
  Administered 2015-10-27: 2 via ORAL
  Filled 2015-10-27: qty 2

## 2015-10-27 MED ORDER — GI COCKTAIL ~~LOC~~
30.0000 mL | Freq: Once | ORAL | Status: AC
  Administered 2015-10-27: 30 mL via ORAL
  Filled 2015-10-27: qty 30

## 2015-10-27 MED ORDER — PANTOPRAZOLE SODIUM 40 MG PO TBEC: 40.0000 mg | DELAYED_RELEASE_TABLET | Freq: Every day | ORAL | Status: DC

## 2015-10-27 MED ORDER — PANTOPRAZOLE SODIUM 40 MG PO TBEC
40.0000 mg | DELAYED_RELEASE_TABLET | Freq: Once | ORAL | Status: AC
  Administered 2015-10-27: 40 mg via ORAL
  Filled 2015-10-27: qty 1

## 2015-10-27 NOTE — Discharge Instructions (Signed)
Nonspecific Chest Pain  °Chest pain can be caused by many different conditions. There is always a chance that your pain could be related to something serious, such as a heart attack or a blood clot in your lungs. Chest pain can also be caused by conditions that are not life-threatening. If you have chest pain, it is very important to follow up with your health care provider. °CAUSES  °Chest pain can be caused by: °· Heartburn. °· Pneumonia or bronchitis. °· Anxiety or stress. °· Inflammation around your heart (pericarditis) or lung (pleuritis or pleurisy). °· A blood clot in your lung. °· A collapsed lung (pneumothorax). It can develop suddenly on its own (spontaneous pneumothorax) or from trauma to the chest. °· Shingles infection (varicella-zoster virus). °· Heart attack. °· Damage to the bones, muscles, and cartilage that make up your chest wall. This can include: °¨ Bruised bones due to injury. °¨ Strained muscles or cartilage due to frequent or repeated coughing or overwork. °¨ Fracture to one or more ribs. °¨ Sore cartilage due to inflammation (costochondritis). °RISK FACTORS  °Risk factors for chest pain may include: °· Activities that increase your risk for trauma or injury to your chest. °· Respiratory infections or conditions that cause frequent coughing. °· Medical conditions or overeating that can cause heartburn. °· Heart disease or family history of heart disease. °· Conditions or health behaviors that increase your risk of developing a blood clot. °· Having had chicken pox (varicella zoster). °SIGNS AND SYMPTOMS °Chest pain can feel like: °· Burning or tingling on the surface of your chest or deep in your chest. °· Crushing, pressure, aching, or squeezing pain. °· Dull or sharp pain that is worse when you move, cough, or take a deep breath. °· Pain that is also felt in your back, neck, shoulder, or arm, or pain that spreads to any of these areas. °Your chest pain may come and go, or it may stay  constant. °DIAGNOSIS °Lab tests or other studies may be needed to find the cause of your pain. Your health care provider may have you take a test called an ambulatory ECG (electrocardiogram). An ECG records your heartbeat patterns at the time the test is performed. You may also have other tests, such as: °· Transthoracic echocardiogram (TTE). During echocardiography, sound waves are used to create a picture of all of the heart structures and to look at how blood flows through your heart. °· Transesophageal echocardiogram (TEE). This is a more advanced imaging test that obtains images from inside your body. It allows your health care provider to see your heart in finer detail. °· Cardiac monitoring. This allows your health care provider to monitor your heart rate and rhythm in real time. °· Holter monitor. This is a portable device that records your heartbeat and can help to diagnose abnormal heartbeats. It allows your health care provider to track your heart activity for several days, if needed. °· Stress tests. These can be done through exercise or by taking medicine that makes your heart beat more quickly. °· Blood tests. °· Imaging tests. °TREATMENT  °Your treatment depends on what is causing your chest pain. Treatment may include: °· Medicines. These may include: °¨ Acid blockers for heartburn. °¨ Anti-inflammatory medicine. °¨ Pain medicine for inflammatory conditions. °¨ Antibiotic medicine, if an infection is present. °¨ Medicines to dissolve blood clots. °¨ Medicines to treat coronary artery disease. °· Supportive care for conditions that do not require medicines. This may include: °¨ Resting. °¨ Applying heat   or cold packs to injured areas. °¨ Limiting activities until pain decreases. °HOME CARE INSTRUCTIONS °· If you were prescribed an antibiotic medicine, finish it all even if you start to feel better. °· Avoid any activities that bring on chest pain. °· Do not use any tobacco products, including  cigarettes, chewing tobacco, or electronic cigarettes. If you need help quitting, ask your health care provider. °· Do not drink alcohol. °· Take medicines only as directed by your health care provider. °· Keep all follow-up visits as directed by your health care provider. This is important. This includes any further testing if your chest pain does not go away. °· If heartburn is the cause for your chest pain, you may be told to keep your head raised (elevated) while sleeping. This reduces the chance that acid will go from your stomach into your esophagus. °· Make lifestyle changes as directed by your health care provider. These may include: °¨ Getting regular exercise. Ask your health care provider to suggest some activities that are safe for you. °¨ Eating a heart-healthy diet. A registered dietitian can help you to learn healthy eating options. °¨ Maintaining a healthy weight. °¨ Managing diabetes, if necessary. °¨ Reducing stress. °SEEK MEDICAL CARE IF: °· Your chest pain does not go away after treatment. °· You have a rash with blisters on your chest. °· You have a fever. °SEEK IMMEDIATE MEDICAL CARE IF:  °· Your chest pain is worse. °· You have an increasing cough, or you cough up blood. °· You have severe abdominal pain. °· You have severe weakness. °· You faint. °· You have chills. °· You have sudden, unexplained chest discomfort. °· You have sudden, unexplained discomfort in your arms, back, neck, or jaw. °· You have shortness of breath at any time. °· You suddenly start to sweat, or your skin gets clammy. °· You feel nauseous or you vomit. °· You suddenly feel light-headed or dizzy. °· Your heart begins to beat quickly, or it feels like it is skipping beats. °These symptoms may represent a serious problem that is an emergency. Do not wait to see if the symptoms will go away. Get medical help right away. Call your local emergency services (911 in the [place] Do not drive yourself to the hospital. °  °This  information is not intended to replace advice given to you by your health care provider. Make sure you discuss any questions you have with your health care provider. °  °Document Released: 06/15/2005 Document Revised: 09/26/2014 Document Reviewed: 04/11/2014 °Elsevier Interactive Patient Education ©2016 [employer] ° °

## 2015-10-27 NOTE — ED Provider Notes (Signed)
By signing my name below, I, Rowan Blase, attest that this documentation has been prepared under the direction and in the presence of [place], DO . Electronically Signed: Rowan Blase, Scribe. 10/27/2015. 12:53 AM.  TIME SEEN: 12:37 AM  CHIEF COMPLAINT: Chest Pain  HPI: HPI Comments:  Tracey Henson is a 65 y.o. female with PMHx of GERD who presents to the Emergency Department complaining of middle and right sided chest pain radiating to right shoulder and into her back. She notes stabbing pain in her back behind her right shoulder blade; she also notes chest pain feels like a bubble in front of chest that feels like indigestion and fullness. She reports that the chest pain feels like it would be alleviated with a burp. Pt attempted drinking a soda with no relief. Pt denies exacerbation with movement, anything PO, deep breath, or exertion. Pt reports associated dizziness, nausea, SOB, and diaphoresis.  Pt notes she sees cardiologist once a year and she recently saw her PCP. Pt reports having a stress test in 2016 with Dr. Marlou Porch that was normal. She also notes treatment for URI in the past month. Pt notes FHx of CAD. Denies PMHx of blood clots in legs or lungs, exogenous estrogen use, recent surgery, fracture, trauma. No history of HTN, HLD, DM, smoking, cardia catheterization.  Denies choescystectomy.  ROS: See HPI Constitutional: no fever  Eyes: no drainage  ENT: no runny nose   Cardiovascular:   chest pain  Resp:  SOB  GI: no vomiting GU: no dysuria Integumentary: no rash  Allergy: no hives  Musculoskeletal: no leg swelling  Neurological: no slurred speech ROS otherwise negative  PAST MEDICAL HISTORY/PAST SURGICAL HISTORY:  Past Medical History  Diagnosis Date  . Breast cyst   . Baker cyst   . Hypercholesteremia     MEDICATIONS:  Prior to Admission medications   Medication Sig Start Date End Date Taking? Authorizing Provider  atorvastatin (LIPITOR) 40 MG tablet  Take 1 tablet (40 mg total) by mouth daily. 05/28/15  Yes Jerline Pain, MD  Biotin (BIOTIN 5000) 5 MG CAPS Take 1 capsule by mouth daily.   Yes Historical Provider, MD  loratadine (CLARITIN) 10 MG tablet Take 10 mg by mouth daily as needed for allergies.    Yes Historical Provider, MD  omeprazole (PRILOSEC) 20 MG capsule Take 20 mg by mouth daily.   Yes Historical Provider, MD  Probiotic Product (PROBIOTIC PO) Take 1 capsule by mouth daily.   Yes Historical Provider, MD  VENTOLIN HFA 108 (90 Base) MCG/ACT inhaler Inhale 2 puffs into the lungs every 4 (four) hours as needed. Shortness of breath/ wheezing 10/02/15  Yes Historical Provider, MD  vitamin B-12 (CYANOCOBALAMIN) 1000 MCG tablet Take 2,000 mcg by mouth daily.   Yes Historical Provider, MD  azithromycin (ZITHROMAX) 250 MG tablet Take 250 mg by mouth daily. Take 500mg  on day 1 and then 250mg  daily for 5 days 10/07/15   Historical Provider, MD  benzonatate (TESSALON) 200 MG capsule One four times daily as needed for cough Patient not taking: Reported on 10/26/2015 06/08/15   Tanda Rockers, MD  HYDROcodone-acetaminophen (NORCO/VICODIN) 5-325 MG tablet Take 1-2 tablets by mouth every 6 hours as needed for pain and/or cough. Patient not taking: Reported on 10/26/2015 08/01/15   Elmyra Ricks Pisciotta, PA-C  montelukast (SINGULAIR) 10 MG tablet Take 1 tablet (10 mg total) by mouth at bedtime. Patient not taking: Reported on 10/26/2015 07/20/15   Tanda Rockers, MD  ALLERGIES:  Allergies  Allergen Reactions  . Cefaclor Hives  . Codeine     Slows heart rate right down  . Crestor [Rosuvastatin] Nausea And Vomiting    SOCIAL HISTORY:  Social History  Substance Use Topics  . Smoking status: Never Smoker   . Smokeless tobacco: Never Used  . Alcohol Use: No     Comment: Occassional wine    FAMILY HISTORY: Family History  Problem Relation Age of Onset  . Lung cancer Mother     smoked  . Heart attack Mother   . Coronary artery disease Mother   .  Heart attack Father   . Coronary artery disease Father   . Heart disease Sister   . Stroke Maternal Grandmother   . Heart attack Maternal Grandfather   . Coronary artery disease Maternal Grandfather     EXAM: BP 120/44 mmHg  Pulse 60  Temp(Src) 97.8 F (36.6 C) (Oral)  Resp 12  Ht 5\' 4"  (1.626 m)  Wt 138 lb (62.596 kg)  BMI 23.68 kg/m2  SpO2 97% CONSTITUTIONAL: Alert and oriented and responds appropriately to questions. Well-appearing; well-nourished; appears uncomfortable HEAD: Normocephalic EYES: Conjunctivae clear, PERRL ENT: normal nose; no rhinorrhea; moist mucous membranes; pharynx without lesions noted NECK: Supple, no meningismus, no LAD  CARD: RRR; S1 and S2 appreciated; no murmurs, no clicks, no rubs, no gallops RESP: Normal chest excursion without splinting or tachypnea; breath sounds clear and equal bilaterally; no wheezes, no rhonchi, no rales, no hypoxia or respiratory distress, speaking full sentences ABD/GI: Normal bowel sounds; non-distended; soft, non-tender, no rebound, no guarding, no peritoneal signs, negative Murphy sign BACK:  The back appears normal and is non-tender to palpation, there is no CVA tenderness; no midline spinal tenderness, step off, or defromity EXT: Normal ROM in all joints; non-tender to palpation; no edema; normal capillary refill; no cyanosis, no calf tenderness or swelling    SKIN: Normal color for age and race; warm NEURO: Moves all extremities equally, sensation to light touch intact diffusely, cranial nerves II through XII intact PSYCH: The patient's mood and manner are appropriate. Grooming and personal hygiene are appropriate.  MEDICAL DECISION MAKING: Patient here with right-sided chest pain that radiates into her right back. States that she was scared to eat today as it may make her symptoms worse. Has history of GERD. Only risk factors for ACS are age and family history.  Abdominal exam is benign. Labs ordered in triage are  unremarkable including negative troponin. Lipase also normal. Will add on LFTs, repeat second troponin and obtain a d-dimer. Chest x-ray clear. EKG shows mild ST depression diffusely with no old for comparison. Will give GI cocktail, Protonix to see if this helps with her symptoms. Differential diagnosis includes GERD, esophagitis, gastritis, ACS, PE, dissection.  ED PROGRESS: Pt's second troponin is negative. D-dimer is also negative. LFTs are normal. Second EKG shows resolution of minimal ST depression seen diffusely. She reports feeling much better after GI cocktail. Still having some mild pain in her back. We'll give Vicodin. I have low suspicion for cardiac abnormality but suspect this may be more likely GI related. She does have a cardiologist for follow-up and states she had a stress last last year that was normal. Have advised her to make appointment to see her cardiologist and PCP this week. Discussed strict return precautions. She states that she would prefer discharge over admission for observation. I feel that this is an appropriate plan. She verbalizes understanding and is comfortable with plan.  EKG Interpretation  Date/Time:  Monday October 26 2015 19:57:28 EST Ventricular Rate:  66 PR Interval:  134 QRS Duration: 97 QT Interval:  407 QTC Calculation: 426 R Axis:   69 Text Interpretation:  Sinus rhythm Consider left atrial enlargement Minimal ST depression, diffuse leads No old tracing to compare Confirmed by WARD,  DO, KRISTEN [phone]) on 10/27/2015 12:10:44 AM         EKG Interpretation  Date/Time:  Tuesday October 27 2015 02:32:44 EST Ventricular Rate:  51 PR Interval:  148 QRS Duration: 96 QT Interval:  453 QTC Calculation: 417 R Axis:   70 Text Interpretation:  Sinus rhythm Nonspecific T abnrm, anterolateral leads Confirmed by WARD,  DO, KRISTEN [id number]) on 10/27/2015 3:00:44 AM        I personally performed the services described in this documentation, which  was scribed in my presence. The recorded information has been reviewed and is accurate.    [place], DO 10/27/15 0301

## 2015-10-29 ENCOUNTER — Ambulatory Visit
Admission: RE | Admit: 2015-10-29 | Discharge: 2015-10-29 | Disposition: A | Discharge status: Home / Self Care | Payer: Commercial Managed Care - HMO | Source: Ambulatory Visit | Attending: Gastroenterology | Admitting: Gastroenterology

## 2015-10-29 ENCOUNTER — Other Ambulatory Visit: Payer: Self-pay | Admitting: Gastroenterology

## 2015-10-29 DIAGNOSIS — R1084 Generalized abdominal pain: Secondary | ICD-10-CM | POA: Diagnosis not present

## 2015-10-29 DIAGNOSIS — R1013 Epigastric pain: Secondary | ICD-10-CM | POA: Diagnosis not present

## 2015-10-29 DIAGNOSIS — R194 Change in bowel habit: Secondary | ICD-10-CM | POA: Diagnosis not present

## 2015-10-29 DIAGNOSIS — R110 Nausea: Secondary | ICD-10-CM | POA: Diagnosis not present

## 2015-10-29 MED ORDER — IOPAMIDOL (ISOVUE-300) INJECTION 61%
100.0000 mL | Freq: Once | INTRAVENOUS | Status: AC | PRN
  Administered 2015-10-29: 100 mL via INTRAVENOUS

## 2015-11-04 ENCOUNTER — Other Ambulatory Visit: Payer: Self-pay | Admitting: Gastroenterology

## 2015-11-04 DIAGNOSIS — K621 Rectal polyp: Secondary | ICD-10-CM | POA: Diagnosis not present

## 2015-11-04 DIAGNOSIS — R110 Nausea: Secondary | ICD-10-CM | POA: Diagnosis not present

## 2015-11-04 DIAGNOSIS — K449 Diaphragmatic hernia without obstruction or gangrene: Secondary | ICD-10-CM | POA: Diagnosis not present

## 2015-11-04 DIAGNOSIS — R194 Change in bowel habit: Secondary | ICD-10-CM | POA: Diagnosis not present

## 2015-11-04 DIAGNOSIS — K640 First degree hemorrhoids: Secondary | ICD-10-CM | POA: Diagnosis not present

## 2015-11-04 DIAGNOSIS — R1084 Generalized abdominal pain: Secondary | ICD-10-CM | POA: Diagnosis not present

## 2015-11-19 DIAGNOSIS — R6889 Other general symptoms and signs: Secondary | ICD-10-CM | POA: Diagnosis not present

## 2015-11-19 DIAGNOSIS — B349 Viral infection, unspecified: Secondary | ICD-10-CM | POA: Diagnosis not present

## 2016-02-13 DIAGNOSIS — B029 Zoster without complications: Secondary | ICD-10-CM | POA: Diagnosis not present

## 2016-02-18 DIAGNOSIS — H5210 Myopia, unspecified eye: Secondary | ICD-10-CM | POA: Diagnosis not present

## 2016-02-18 DIAGNOSIS — H524 Presbyopia: Secondary | ICD-10-CM | POA: Diagnosis not present

## 2016-03-20 DIAGNOSIS — B029 Zoster without complications: Secondary | ICD-10-CM | POA: Diagnosis not present

## 2016-03-31 DIAGNOSIS — A6004 Herpesviral vulvovaginitis: Secondary | ICD-10-CM | POA: Diagnosis not present

## 2016-04-15 ENCOUNTER — Other Ambulatory Visit: Payer: Self-pay | Admitting: Internal Medicine

## 2016-04-15 DIAGNOSIS — R918 Other nonspecific abnormal finding of lung field: Secondary | ICD-10-CM

## 2016-05-04 ENCOUNTER — Other Ambulatory Visit: Payer: Self-pay | Admitting: Cardiology

## 2016-05-30 ENCOUNTER — Ambulatory Visit (INDEPENDENT_AMBULATORY_CARE_PROVIDER_SITE_OTHER)
Admission: RE | Admit: 2016-05-30 | Discharge: 2016-05-30 | Disposition: A | Discharge status: Home / Self Care | Payer: Commercial Managed Care - HMO | Source: Ambulatory Visit | Attending: Internal Medicine | Admitting: Internal Medicine

## 2016-05-30 DIAGNOSIS — R911 Solitary pulmonary nodule: Secondary | ICD-10-CM | POA: Diagnosis not present

## 2016-05-30 DIAGNOSIS — R918 Other nonspecific abnormal finding of lung field: Secondary | ICD-10-CM | POA: Diagnosis not present

## 2016-05-31 NOTE — Progress Notes (Signed)
Spoke with pt and notified of results per Dr. Wert. Pt verbalized understanding and denied any questions. 

## 2016-06-01 ENCOUNTER — Other Ambulatory Visit: Payer: Self-pay | Admitting: Cardiology

## 2016-06-05 ENCOUNTER — Other Ambulatory Visit: Payer: Self-pay | Admitting: Cardiology

## 2016-06-10 ENCOUNTER — Other Ambulatory Visit: Payer: Self-pay | Admitting: Cardiology

## 2016-06-13 ENCOUNTER — Other Ambulatory Visit: Payer: Self-pay | Admitting: Cardiology

## 2016-06-15 ENCOUNTER — Other Ambulatory Visit: Payer: Self-pay | Admitting: Cardiology

## 2016-06-17 ENCOUNTER — Telehealth: Payer: Self-pay | Admitting: Cardiology

## 2016-06-17 MED ORDER — ATORVASTATIN CALCIUM 40 MG PO TABS: 40.0000 mg | ORAL_TABLET | Freq: Every day | ORAL | 0 refills | Status: DC

## 2016-06-17 NOTE — Telephone Encounter (Signed)
Follow up  Pt voiced wondering if she has to have labs if so pt voiced to follow up so she'll be prepared to fast for labs.  Please f/u

## 2016-06-17 NOTE — Telephone Encounter (Signed)
Pt Rx was sent to pt's pharmacy as requested. Confirmation received. °

## 2016-06-17 NOTE — Telephone Encounter (Signed)
Pt aware of recommendation.  She will f/u as scheduled.

## 2016-06-17 NOTE — Telephone Encounter (Signed)
New Message   *STAT* If patient is at the pharmacy, call can be transferred to refill team.   1. Which medications need to be refilled? (please list name of each medication and dose if known) lipitor 40 mg tablet once daily  2. Which pharmacy/location (including street and city if local pharmacy) is medication to be sent to? [facility] [address], [place], [place]  3. Do they need a 30 day or 90 day supply? 90 days

## 2016-06-17 NOTE — Telephone Encounter (Signed)
No need for labs.  Tracey Furbish, MD

## 2016-06-17 NOTE — Telephone Encounter (Signed)
Pt has an appt 11/21 - asking if she needed any lab work - on Lipitor.  Last Lipid panel was 12/16.  Will forward to Candee Furbish, MD for review/orders.

## 2016-06-27 ENCOUNTER — Ambulatory Visit (INDEPENDENT_AMBULATORY_CARE_PROVIDER_SITE_OTHER): Payer: Commercial Managed Care - HMO | Admitting: Orthopaedic Surgery

## 2016-07-04 ENCOUNTER — Ambulatory Visit (INDEPENDENT_AMBULATORY_CARE_PROVIDER_SITE_OTHER): Payer: Commercial Managed Care - HMO | Admitting: Orthopaedic Surgery

## 2016-07-04 DIAGNOSIS — M65341 Trigger finger, right ring finger: Secondary | ICD-10-CM

## 2016-07-04 DIAGNOSIS — M654 Radial styloid tenosynovitis [de Quervain]: Secondary | ICD-10-CM | POA: Diagnosis not present

## 2016-07-19 ENCOUNTER — Ambulatory Visit (INDEPENDENT_AMBULATORY_CARE_PROVIDER_SITE_OTHER): Payer: Commercial Managed Care - HMO | Admitting: Orthopaedic Surgery

## 2016-07-19 ENCOUNTER — Other Ambulatory Visit (HOSPITAL_BASED_OUTPATIENT_CLINIC_OR_DEPARTMENT_OTHER): Payer: Commercial Managed Care - HMO

## 2016-07-19 ENCOUNTER — Encounter: Payer: Commercial Managed Care - HMO | Admitting: Podiatry

## 2016-07-26 ENCOUNTER — Ambulatory Visit (INDEPENDENT_AMBULATORY_CARE_PROVIDER_SITE_OTHER): Payer: Commercial Managed Care - HMO | Admitting: Podiatry

## 2016-07-26 ENCOUNTER — Encounter: Payer: Self-pay | Admitting: Podiatry

## 2016-07-26 ENCOUNTER — Ambulatory Visit (HOSPITAL_BASED_OUTPATIENT_CLINIC_OR_DEPARTMENT_OTHER)
Admission: RE | Admit: 2016-07-26 | Discharge: 2016-07-26 | Disposition: A | Discharge status: Home / Self Care | Payer: Commercial Managed Care - HMO | Source: Ambulatory Visit | Attending: Podiatry | Admitting: Podiatry

## 2016-07-26 DIAGNOSIS — L603 Nail dystrophy: Secondary | ICD-10-CM

## 2016-07-26 DIAGNOSIS — R52 Pain, unspecified: Secondary | ICD-10-CM

## 2016-07-26 DIAGNOSIS — Z0389 Encounter for observation for other suspected diseases and conditions ruled out: Secondary | ICD-10-CM | POA: Diagnosis not present

## 2016-07-26 NOTE — Patient Instructions (Signed)

## 2016-07-26 NOTE — Progress Notes (Signed)
   Subjective:    Patient ID: Tracey Henson, female    DOB: 1951/08/15, 65 y.o.   MRN: [id number]  HPI  65 year old female presents the office today for concerns of her right big toenail becoming discolored, thick and painful. This is been ongoing for about 15 years and is intermittent. She states that the toenail does not grow. It is painful with pressure and shoegear. She did not notice any redness or drainage along the area. She is requesting toenail removed permanently. No other complaints at this time.  Review of Systems  All other systems reviewed and are negative.      Objective:   Physical Exam General: AAO x3, NAD  Dermatological: Right hallux toenail hypertrophic, dystrophic, discolored and loosened from the underlying nail bed. The patient a very damage. Tenderness to the nail. This is any redness or drainage or any signs of infection. No specific and pathology other toenails. No open lesions or pre-ulcerative lesions.  Vascular: Dorsalis Pedis artery and Posterior Tibial artery pedal pulses are 2/4 bilateral with immedate capillary fill time. There is no pain with calf compression, swelling, warmth, erythema.   Neruologic: Grossly intact via light touch bilateral. Vibratory intact via tuning fork bilateral. Protective threshold with Semmes Wienstein monofilament intact to all pedal sites bilateral.   Musculoskeletal: No gross boney pedal deformities bilateral. No pain, crepitus, or limitation noted with foot and ankle range of motion bilateral. Muscular strength 5/5 in all groups tested bilateral.  Gait: Unassisted, Nonantalgic.     Assessment & Plan:  65 year old female right hallux onychodystrophy, painful -Treatment options discussed including all alternatives, risks, and complications -Etiology of symptoms were discussed -X-rays were obtained and reviewed with the patient. No spurring present. No evidence of acute fracture. -At this time, the patient is requesting total  nail removal with chemical matricectomy to the symptomatic portion of the nail. Risks and complications were discussed with the patient for which they understand and  verbally consent to the procedure. Under sterile conditions a total of 3 mL of a mixture of 2% lidocaine plain and 0.5% Marcaine plain was infiltrated in a hallux block fashion. Once anesthetized, the skin was prepped in sterile fashion. A tourniquet was then applied. Next the right hallux nail was then excised making sure to remove the entire offending nail borders. Once the nails were ensured to be removed area was debrided and the underlying skin was intact. There is no purulence identified in the procedure. Next phenol was then applied under standard conditions and copiously irrigated. Silvadene was applied. A dry sterile dressing was applied. After application of the dressing the tourniquet was removed and there is found to be an immediate capillary refill time to the digit. The patient tolerated the procedure well any complications. Post procedure instructions were discussed the patient for which he verbally understood. Follow-up in one week for nail check or sooner if any problems are to arise. Discussed signs/symptoms of infection and directed to call the office immediately should any occur or go directly to the emergency room. In the meantime, encouraged to call the office with any questions, concerns, changes symptoms.

## 2016-07-29 ENCOUNTER — Telehealth: Payer: Self-pay | Admitting: *Deleted

## 2016-07-29 NOTE — Telephone Encounter (Signed)
Pt states she had a toenail procedure 07/26/2016 and wanted to know how long the toe would be red. I informed pt the toe could be potentially red, swollen, painful and draining for up to 4-6 weeks to varying degrees, but the further away from the surgery date the less the symptoms. I told pt the area is very sensitive now but show have less reaction to epsom salt soak or white vinegar soak as gets further from the surgery. Pt states understanding.

## 2016-08-05 NOTE — Progress Notes (Signed)
No show/patient cancelled

## 2016-08-08 ENCOUNTER — Telehealth: Payer: Self-pay | Admitting: *Deleted

## 2016-08-08 NOTE — Telephone Encounter (Addendum)
Pt states her toe is very red and sore. Left message informing pt that often after a permanent toenail procedure the toe remains red, and sore for up to 4-6 weeks to varying degrees. I told pt that if she is using the antibacterial soap soaks to be sure to rinse well, or could change to Epsom salt 1/2C to 1Qt warm water and soak 2 time daily and apply a lighttly coated antibiotic cream to the area, and to call if had concerns or if redness streaked from toe to foot or if had a cloudy discharge or if she would like an appt. 08/09/2016-Pt states she would like me to take a look at the toe, by email. I told pt if she was having a problem with redness to the toe she would benefit from an appt and transferred pt to schedulers.

## 2016-08-09 ENCOUNTER — Ambulatory Visit (INDEPENDENT_AMBULATORY_CARE_PROVIDER_SITE_OTHER): Payer: Commercial Managed Care - HMO | Admitting: Cardiology

## 2016-08-09 ENCOUNTER — Encounter: Payer: Self-pay | Admitting: Cardiology

## 2016-08-09 VITALS — BP 116/62 | HR 60 | Ht 64.0 in | Wt 142.6 lb

## 2016-08-09 DIAGNOSIS — E7801 Familial hypercholesterolemia: Secondary | ICD-10-CM | POA: Diagnosis not present

## 2016-08-09 DIAGNOSIS — Z8249 Family history of ischemic heart disease and other diseases of the circulatory system: Secondary | ICD-10-CM | POA: Diagnosis not present

## 2016-08-09 DIAGNOSIS — Z23 Encounter for immunization: Secondary | ICD-10-CM | POA: Diagnosis not present

## 2016-08-09 MED ORDER — ATORVASTATIN CALCIUM 40 MG PO TABS: 40.0000 mg | ORAL_TABLET | Freq: Every day | ORAL | 3 refills | Status: DC

## 2016-08-09 NOTE — Progress Notes (Signed)
[facility] [address], [address] [place], [place]  [address] Phone: [phone] Fax:  [phone]  Date:  08/09/2016   ID:  Tracey Henson, DOB [birth year], MRN [id number]  PCP:  Lynne Logan, MD   History of Present Illness: Tracey Henson is a 65 y.o. female with strong family history of coronary artery disease with 13 siblings with CAD who underwent nuclear stress test 9/14 that overall was low risk but no ischemia however she did have upsloping ST segment depression on EKG portion. She is here for followup.  Red Yeast Rice- 4 tablets a day at one point. Her calculated LDL was 153, above normal, LDL particle was 1942, HDL particle number was 29, total cholesterol 221, small LDL particle number was 890 with normal being less than 527. Liver functions normal.  Has had jabbing pain in right shoulder blade. No significant exertional anginal symptoms. She's also had some atypical substernal discomfort that is fleeting. She will feel palpitations and pounding. When taking a deep breath, she feels dizzy at times.  Sister has CAD. She is moving down from [place]. She reminded me that her father had an EKG on 12/25/2022 and then died on 12-20-22.  She has not tolerated Crestor in the past. She is however tolerating the atorvastatin 40 mg. Doing very well with this. She was pleased with the reduction in LDL. This is the only medication she is taking. She is very particular about her diet.  Her toe was recently, toenail removed-she thinks it is infected now. She will be seeking care.   Wt Readings from Last 3 Encounters:  08/09/16 142 lb 9.6 oz (64.7 kg)  10/26/15 138 lb (62.6 kg)  08/18/15 141 lb 12.8 oz (64.3 kg)     Past Medical History:  Diagnosis Date  . Baker cyst   . Breast cyst   . Hypercholesteremia     Past Surgical History:  Procedure Laterality Date  . ABDOMINAL HYSTERECTOMY    . APPENDECTOMY    . BREAST SURGERY    . TEMPOROMANDIBULAR JOINT SURGERY      Current  Outpatient Prescriptions  Medication Sig Dispense Refill  . atorvastatin (LIPITOR) 40 MG tablet Take 1 tablet (40 mg total) by mouth daily. 90 tablet 3   No current facility-administered medications for this visit.     Allergies:    Allergies  Allergen Reactions  . Cefaclor Hives  . Codeine     Slows heart rate right down  . Crestor [Rosuvastatin] Nausea And Vomiting    Social History:  The patient  reports that she has never smoked. She has never used smokeless tobacco. She reports that she does not drink alcohol or use drugs.   ROS:  Please see the history of present illness.   Denies any syncope, bleeding, orthopnea, PND or    PHYSICAL EXAM: VS:  BP 116/62   Pulse 60   Ht 5\' 4"  (1.626 m)   Wt 142 lb 9.6 oz (64.7 kg)   LMP  (LMP Unknown)   BMI 24.48 kg/m  Well nourished, well developed, in no acute distress  HEENT: normal  Neck: no JVD  Cardiac:  normal S1, S2; RRR; no murmur  Lungs:  clear to auscultation bilaterally, no wheezing, rhonchi or rales  Abd: soft, nontender, no hepatomegaly  Ext: no edema  Skin: warm and dry  Neuro: no focal abnormalities noted  EKG:  Today 05/22/2015-sinus bradycardia rate 54 with no other abnormalities. Personally viewed-prior  Sinus rhythm, 61, no other abnormalities    Nuclear stress 9/14-low risk, no ischemia-upsloping ST segment depression, nondiagnostic Labs: As described above  ASSESSMENT AND PLAN:  1. Chest pain-atypical, right scapular pain. Went to the ER on 10/26/15-troponin normal. Likely musculoskeletal. Recent nuclear stress test reassuring 12/20/2012. Continue with aggressive primary prevention. Exercise. She eats for a well, no hormones, mostly vegetarian. Her father in 1966/12/21 EKG on 2022-12-26 and died on 12-27-2022. CT calcium score-15. LAD calcium 2. Strong family history of CAD-multiple siblings. Father. Primary prevention. Has done an excellent job with atorvastatin 40 mg. This is cut her LDL from 153 down to  70. 3. Hyperlipidemia-LDL. As above. 4. Palpitations-occasionally will feel pounding/racing of her heart at night. Fleeting, transient. Continue to monitor. This really has not changed much over the past year.  Signed, Candee Furbish, MD [facility]  08/09/2016 4:25 PM

## 2016-08-09 NOTE — Patient Instructions (Signed)

## 2016-08-10 ENCOUNTER — Telehealth: Payer: Self-pay | Admitting: Podiatry

## 2016-08-10 ENCOUNTER — Ambulatory Visit: Payer: Commercial Managed Care - HMO | Admitting: Podiatry

## 2016-08-10 NOTE — Telephone Encounter (Signed)
Pt cxled appt for today with Dr Charleston Ropes states she changed the soak and the redness is gone and feeling 90% better. Pt states will call on Monday if appt if not better.

## 2016-08-10 NOTE — Telephone Encounter (Signed)
Pt states she switched her soaks and the redness has decreased and the toe feels better, will call again Monday if problem.

## 2017-03-15 DIAGNOSIS — H524 Presbyopia: Secondary | ICD-10-CM | POA: Diagnosis not present

## 2017-03-28 DIAGNOSIS — Z0100 Encounter for examination of eyes and vision without abnormal findings: Secondary | ICD-10-CM | POA: Diagnosis not present

## 2017-04-24 ENCOUNTER — Other Ambulatory Visit: Payer: Self-pay | Admitting: *Deleted

## 2017-04-24 DIAGNOSIS — R918 Other nonspecific abnormal finding of lung field: Secondary | ICD-10-CM

## 2017-04-25 ENCOUNTER — Telehealth: Payer: Self-pay | Admitting: Internal Medicine

## 2017-04-25 NOTE — Telephone Encounter (Signed)
lmtcb x1 for pt. 

## 2017-04-26 NOTE — Telephone Encounter (Signed)
°  Pt retuening call and can be reached @ [phone].Tracey Henson

## 2017-04-26 NOTE — Telephone Encounter (Signed)
lmtcb x2 for pt. 

## 2017-04-26 NOTE — Telephone Encounter (Signed)
Attempted to contact pt. No answer, no option to leave a message. Will try back.  

## 2017-04-27 NOTE — Telephone Encounter (Signed)
lmtcb x3 for pt. 

## 2017-05-01 NOTE — Telephone Encounter (Signed)
We have attempted to contact pt several times with no call back. Per triage protocol, message will be closed.  

## 2017-05-31 ENCOUNTER — Ambulatory Visit (INDEPENDENT_AMBULATORY_CARE_PROVIDER_SITE_OTHER)
Admission: RE | Admit: 2017-05-31 | Discharge: 2017-05-31 | Disposition: A | Discharge status: Home / Self Care | Payer: Medicare HMO | Source: Ambulatory Visit | Attending: Internal Medicine | Admitting: Internal Medicine

## 2017-05-31 DIAGNOSIS — R918 Other nonspecific abnormal finding of lung field: Secondary | ICD-10-CM

## 2017-05-31 NOTE — Progress Notes (Signed)
Spoke with pt and notified of results per Dr. Wert. Pt verbalized understanding and denied any questions. 

## 2017-06-01 DIAGNOSIS — H5712 Ocular pain, left eye: Secondary | ICD-10-CM | POA: Diagnosis not present

## 2017-06-01 DIAGNOSIS — T1512XA Foreign body in conjunctival sac, left eye, initial encounter: Secondary | ICD-10-CM | POA: Diagnosis not present

## 2017-06-02 ENCOUNTER — Telehealth: Payer: Self-pay | Admitting: Cardiology

## 2017-06-02 DIAGNOSIS — R079 Chest pain, unspecified: Secondary | ICD-10-CM

## 2017-06-02 NOTE — Telephone Encounter (Signed)
Spoke with patient who read on MyChart the results of a chest CT (ordered by Dr Melvyn Novas) of an enlarged heart.  She has never been made aware of this before.  Advised that in review of her chart as far back as 05/2013 a CXR demonstrates a borderline heart enlargement.  Reviewed possible causes of this including birth defects, uncontrolled HTN, At Fib, drug/alcohol abuse and heredity etc.  She is reporting also that she has noticed when she lays down at night she has fast palpitations that are hard.  They last about 10-15 seconds.  Also when she lays on either side she has a discomfort behind the bottom of her left breast that wakes her from her sleep.  She gets relief by getting up and walking around for a bit.  She denies any increase in SOB or N/V when this occurs.  Advised I will forward this information to Dr Marlou Porch for review and will call back with any further recommendations or orders.  Reassurance given that Dr Gustavus Bryant note states he will repeat CT of chest in 1 year.  She is scheduled in follow up with Dr Marlou Porch 12/20.

## 2017-06-02 NOTE — Telephone Encounter (Signed)
New Message  Pt call requesting to speak with Dr. Gillian Shields. Pt states she got a CT scan completed and would like to discuss her readings of an enlarge heart. Please call back to discuss

## 2017-06-03 NOTE — Telephone Encounter (Signed)
Order ECHO to get accurate measurements of heart size. Dx.chest pain. Thanks Candee Furbish, MD

## 2017-06-05 DIAGNOSIS — T1512XD Foreign body in conjunctival sac, left eye, subsequent encounter: Secondary | ICD-10-CM | POA: Diagnosis not present

## 2017-06-08 NOTE — Telephone Encounter (Signed)
Pt aware of Dr Marlou Porch order and will await a call to be scheduled.  Order placed in EPIC per Dr Marlou Porch.

## 2017-06-19 ENCOUNTER — Ambulatory Visit (HOSPITAL_COMMUNITY): Payer: Medicare HMO | Attending: Internal Medicine

## 2017-06-19 ENCOUNTER — Other Ambulatory Visit: Payer: Self-pay

## 2017-06-19 DIAGNOSIS — R079 Chest pain, unspecified: Secondary | ICD-10-CM

## 2017-06-19 DIAGNOSIS — I420 Dilated cardiomyopathy: Secondary | ICD-10-CM | POA: Diagnosis not present

## 2017-06-19 DIAGNOSIS — I051 Rheumatic mitral insufficiency: Secondary | ICD-10-CM | POA: Diagnosis not present

## 2017-06-27 ENCOUNTER — Other Ambulatory Visit: Payer: Self-pay | Admitting: Family Medicine

## 2017-06-27 ENCOUNTER — Other Ambulatory Visit (HOSPITAL_COMMUNITY)
Admission: RE | Admit: 2017-06-27 | Discharge: 2017-06-27 | Disposition: A | Discharge status: Home / Self Care | Payer: Medicare HMO | Source: Ambulatory Visit | Attending: Family Medicine | Admitting: Family Medicine

## 2017-06-27 DIAGNOSIS — A6004 Herpesviral vulvovaginitis: Secondary | ICD-10-CM | POA: Diagnosis not present

## 2017-06-27 DIAGNOSIS — Z124 Encounter for screening for malignant neoplasm of cervix: Secondary | ICD-10-CM | POA: Diagnosis not present

## 2017-06-27 DIAGNOSIS — Z0000 Encounter for general adult medical examination without abnormal findings: Secondary | ICD-10-CM | POA: Diagnosis not present

## 2017-06-27 DIAGNOSIS — Z1389 Encounter for screening for other disorder: Secondary | ICD-10-CM | POA: Diagnosis not present

## 2017-06-27 DIAGNOSIS — G4700 Insomnia, unspecified: Secondary | ICD-10-CM | POA: Diagnosis not present

## 2017-06-27 DIAGNOSIS — Z23 Encounter for immunization: Secondary | ICD-10-CM | POA: Diagnosis not present

## 2017-06-27 DIAGNOSIS — N3281 Overactive bladder: Secondary | ICD-10-CM | POA: Diagnosis not present

## 2017-06-27 DIAGNOSIS — E785 Hyperlipidemia, unspecified: Secondary | ICD-10-CM | POA: Diagnosis not present

## 2017-06-29 LAB — CYTOLOGY - PAP
Diagnosis: NEGATIVE
HPV: NOT DETECTED

## 2017-07-28 ENCOUNTER — Other Ambulatory Visit: Payer: Self-pay | Admitting: Family Medicine

## 2017-07-28 DIAGNOSIS — Z1231 Encounter for screening mammogram for malignant neoplasm of breast: Secondary | ICD-10-CM

## 2017-08-28 ENCOUNTER — Ambulatory Visit: Payer: Medicare HMO

## 2017-09-05 ENCOUNTER — Encounter: Payer: Self-pay | Admitting: *Deleted

## 2017-09-06 ENCOUNTER — Ambulatory Visit: Payer: Medicare HMO | Admitting: Diagnostic Neuroimaging

## 2017-09-06 ENCOUNTER — Encounter: Payer: Self-pay | Admitting: Diagnostic Neuroimaging

## 2017-09-06 VITALS — BP 161/69 | HR 67 | Ht 64.0 in | Wt 159.0 lb

## 2017-09-06 DIAGNOSIS — R208 Other disturbances of skin sensation: Secondary | ICD-10-CM | POA: Diagnosis not present

## 2017-09-06 DIAGNOSIS — R202 Paresthesia of skin: Secondary | ICD-10-CM

## 2017-09-06 DIAGNOSIS — R200 Anesthesia of skin: Secondary | ICD-10-CM

## 2017-09-06 NOTE — Progress Notes (Signed)
[facility]  PATIENT: Tracey Henson DOB: 10-09-1950  REFERRING CLINICIAN: Matilde Haymaker, MD HISTORY FROM: patient  REASON FOR VISIT: new consult    HISTORICAL  CHIEF COMPLAINT:  Chief Complaint  Patient presents with  . Tingling in toes    rm 7, New Pt, "numbness, tingling in 3 toes L foot; same sensations in other areas"    HISTORY OF PRESENT ILLNESS:   66 year old female here for evaluation of numbness and tingling.  Summer 2018 patient woke up, felt normal, but when she sat up at the side of the bed and placed pressure on her feet she felt numbness and tingling in her left toes 2-4.  After some time symptoms would resolve.  This continued for several months.  Over time this is become more constant.  She feels more sensation especially if she is standing up for a long time.  No symptoms above her ankle.  No symptoms in her right foot.  In the last few months she has had some intermittent numbness and tingling sensation, electrical sensation, sometimes affecting her nose, sometimes in her back of her neck.  Symptoms she feels like something is falling on her skin and she rubs her nose or scratches her neck.  This is intermittent and random.  Patient also has some stiffness in her neck with "popping and cracking" sound when she turns her head neck.  No recent traumas injuries or accidents.  No recent infections.  Patient has chronic insomnia, with overactivity of thoughts and mind.  She has had more fatigue lately.  More constitutional symptoms.   REVIEW OF SYSTEMS: Full 14 system review of systems performed and negative with exception of: Weight gain fatigue blurred vision easy bruising memory loss headache numbness slurred speech dizziness not enough sleep decreased energy skin sensitivity incontinence itching ringing in ears hearing loss shortness of breath palpitations.  ALLERGIES: Allergies  Allergen Reactions  . Cefaclor Hives  . Codeine     Slows heart  rate right down  . Crestor [Rosuvastatin] Nausea And Vomiting    HOME MEDICATIONS: Outpatient Medications Prior to Visit  Medication Sig Dispense Refill  . atorvastatin (LIPITOR) 40 MG tablet Take 1 tablet (40 mg total) by mouth daily. 90 tablet 3  . omeprazole (PRILOSEC) 20 MG capsule Take 20 mg by mouth as needed.    Marland Kitchen oxybutynin (DITROPAN-XL) 10 MG 24 hr tablet Take 10 mg by mouth daily.  0  . valACYclovir (VALTREX) 500 MG tablet Take 500 mg by mouth daily as needed.  3   No facility-administered medications prior to visit.     PAST MEDICAL HISTORY: Past Medical History:  Diagnosis Date  . Baker cyst    left leg  . Breast cyst   . GERD (gastroesophageal reflux disease)   . Hearing loss   . Hypercholesteremia     PAST SURGICAL HISTORY: Past Surgical History:  Procedure Laterality Date  . ABDOMINAL HYSTERECTOMY  1987  . APPENDECTOMY  1963  . BILATERAL OOPHORECTOMY    . BREAST SURGERY  1990,1991   lumpectomy, 3 x R breats, 2 x Left breast  . SEPTOPLASTY    . TEMPOROMANDIBULAR JOINT SURGERY  1998    FAMILY HISTORY: Family History  Problem Relation Age of Onset  . Lung cancer Mother        smoked  . Heart attack Mother   . Coronary artery disease Mother   . Heart disease Sister   . Hypertension Sister   . Stroke Maternal  Grandmother   . Heart attack Maternal Grandfather   . Coronary artery disease Maternal Grandfather   . Heart attack Father   . Coronary artery disease Father   . Hypertension Brother   . Cancer Paternal Grandmother   . Cancer Maternal Aunt        breast    SOCIAL HISTORY:  Social History   Socioeconomic History  . Marital status: Single    Spouse name: Not on file  . Number of children: 2  . Years of education: assoc degree  . Highest education level: Not on file  Social Needs  . Financial resource strain: Not on file  . Food insecurity - worry: Not on file  . Food insecurity - inability: Not on file  . Transportation needs -  medical: Not on file  . Transportation needs - non-medical: Not on file  Occupational History    Comment: [employer]  Tobacco Use  . Smoking status: Never Smoker  . Smokeless tobacco: Never Used  Substance and Sexual Activity  . Alcohol use: No    Alcohol/week: 0.0 oz    Comment: Occassional wine  . Drug use: No  . Sexual activity: Not on file  Other Topics Concern  . Not on file  Social History Narrative   Caffeine- 2 cups coffee daily     PHYSICAL EXAM  GENERAL EXAM/CONSTITUTIONAL: Vitals:  Vitals:   09/06/17 1104  BP: (!) 161/69  Pulse: 67  Weight: 159 lb (72.1 kg)  Height: 5\' 4"  (1.626 m)     Body mass index is 27.29 kg/m.  Visual Acuity Screening   Right eye Left eye Both eyes  Without correction:     With correction: 20/40 20/40   Comments: monovision    Patient is in no distress; well developed, nourished and groomed; neck is supple  CARDIOVASCULAR:  Examination of carotid arteries is normal; no carotid bruits  Regular rate and rhythm, no murmurs  Examination of peripheral vascular system by observation and palpation is normal  EYES:  Ophthalmoscopic exam of optic discs and posterior segments is normal; no papilledema or hemorrhages  MUSCULOSKELETAL:  Gait, strength, tone, movements noted in Neurologic exam below  NEUROLOGIC: MENTAL STATUS:  No flowsheet data found.  awake, alert, oriented to person, place and time  recent and remote memory intact  normal attention and concentration  language fluent, comprehension intact, naming intact,   fund of knowledge appropriate  CRANIAL NERVE:  2nd - no papilledema on fundoscopic exam  2nd, 3rd, 4th, 6th - pupils equal and reactive to light, visual fields full to confrontation, extraocular muscles intact, no nystagmus  5th - facial sensation symmetric  7th - facial strength symmetric  8th - hearing intact  9th - palate elevates symmetrically, uvula midline  11th - shoulder shrug  symmetric  12th - tongue protrusion midline  MOTOR:   normal bulk and tone, full strength in the BUE, BLE  SENSORY:   normal and symmetric to light touch, temperature, vibration, pinprick; EXCEPT SLIGHT DECR VIB IN LEFT FOOT  COORDINATION:   finger-nose-finger, fine finger movements normal  REFLEXES:   deep tendon reflexes present and symmetric  GAIT/STATION:   narrow based gait; able to walk on toes, heels and tandem; romberg is negative    DIAGNOSTIC DATA (LABS, IMAGING, TESTING) - I reviewed patient records, labs, notes, testing and imaging myself where available.  Lab Results  Component Value Date   WBC 7.2 10/26/2015   HGB 13.3 10/26/2015   HCT 39.0  10/26/2015   MCV 84.3 10/26/2015   PLT 207 10/26/2015      Component Value Date/Time   NA 144 10/26/2015 2120   K 3.7 10/26/2015 2120   CL 107 10/26/2015 2120   GLUCOSE 168 (H) 10/26/2015 2120   BUN 16 10/26/2015 2120   CREATININE 0.70 10/26/2015 2120   PROT 7.1 10/26/2015 2052   ALBUMIN 4.2 10/26/2015 2052   AST 33 10/26/2015 2052   ALT 40 10/26/2015 2052   ALKPHOS 76 10/26/2015 2052   BILITOT 0.4 10/26/2015 2052   Lab Results  Component Value Date   CHOL 134 08/25/2015   HDL 42 (L) 08/25/2015   LDLCALC 77 08/25/2015   TRIG 76 08/25/2015   CHOLHDL 3.2 08/25/2015   No results found for: HGBA1C No results found for: VITAMINB12 No results found for: TSH      ASSESSMENT AND PLAN  66 y.o. year old female here with constellation of symptoms, primarily sensory disturbance in her left toes, nose and back of head and neck.  Neurologic examination unremarkable except for slight decrease sensation in the left foot.  Could represent lumbar radiculopathy versus peripheral neuropathy.  Symptoms are mild and manageable at this time.  Also could represent benign paresthesias, anxiety disorder, other metabolic or vitamin/nutritional issues.  Reviewed options for further testing, but since symptoms are quite mild  at this time will monitor for now.  We may consider additional diagnostic studies in the future if her symptoms significant worsen.  Encouraged patient to focus on healthy lifestyle, sleep management, anxiety treatment.   Dx: benign paresthesias vs lumbar radiculopathy vs peripheral neuropathy  1. Numbness and tingling   2. Numbness of left foot      PLAN:   NUMBNESS / TINGLING - monitor symptoms - if sxs worsen, then may consider add'l testing in future (MRI brain, cervical, lumbar; B12, A1c)  INSOMNIA - sleep hygiene reviewed - consider sleep psychology evaluation for anxiety  Return if symptoms worsen or fail to improve, for return to PCP.    Penni Bombard, MD 46/50/3546, 56:81 AM Certified in Neurology, Neurophysiology and Neuroimaging  [facility] [address] [place], [place] [address] [phone]

## 2017-09-06 NOTE — Patient Instructions (Signed)

## 2017-09-07 ENCOUNTER — Ambulatory Visit: Payer: Medicare HMO | Admitting: Cardiology

## 2017-09-07 ENCOUNTER — Encounter: Payer: Self-pay | Admitting: Cardiology

## 2017-09-07 VITALS — BP 140/84 | HR 67 | Ht 64.0 in | Wt 156.1 lb

## 2017-09-07 DIAGNOSIS — Z8249 Family history of ischemic heart disease and other diseases of the circulatory system: Secondary | ICD-10-CM

## 2017-09-07 DIAGNOSIS — R002 Palpitations: Secondary | ICD-10-CM

## 2017-09-07 DIAGNOSIS — E7801 Familial hypercholesterolemia: Secondary | ICD-10-CM | POA: Diagnosis not present

## 2017-09-07 NOTE — Progress Notes (Signed)
[place]. [address] [place], [place]  [address] Phone: [phone] Fax:  [phone]  Date:  09/07/2017   ID:  Tracey Henson, DOB [birth year], MRN [id number]  PCP:  Donald Prose, MD   History of Present Illness: Tracey Henson is a 66 y.o. female with strong family history of coronary artery disease with 13 siblings with CAD who underwent nuclear stress test 9/14 that overall was low risk but no ischemia however she did have upsloping ST segment depression on EKG portion. She is here for followup.  Red Yeast Rice- 4 tablets a day at one point. Her calculated LDL was 153, above normal, LDL particle was 1942, HDL particle number was 29, total cholesterol 221, small LDL particle number was 890 with normal being less than 527. Liver functions normal.  Has had jabbing pain in right shoulder blade. No significant exertional anginal symptoms. She's also had some atypical substernal discomfort that is fleeting. She will feel palpitations and pounding. When taking a deep breath, she feels dizzy at times.  Sister has CAD. She is moving down from [place]. She reminded me that her father had an EKG on 12/29/2022 and then died on 12/31/2022.  She has not tolerated Crestor in the past. She is however tolerating the atorvastatin 40 mg. Doing very well with this. She was pleased with the reduction in LDL. This is the only medication she is taking. She is very particular about her diet.  Her toe was recently, toenail removed-she thinks it is infected now. She will be seeking care.  09/07/17-she had a CT scan performed by Dr. Melvyn Novas which showed cardiomegaly.  An echocardiogram subsequently was performed in October 2018 which did show right atrial, LA enlargement.  Moderate mitral regurgitation noted.  She also saw neurology because of numbness and tingling. She still has been noticing waking up with heart pounding at night.  No syncope.  No chest pain this visit.  Wt Readings from Last 3 Encounters:    09/07/17 156 lb 1.9 oz (70.8 kg)  09/06/17 159 lb (72.1 kg)  08/09/16 142 lb 9.6 oz (64.7 kg)     Past Medical History:  Diagnosis Date  . Baker cyst    left leg  . Breast cyst   . GERD (gastroesophageal reflux disease)   . Hearing loss   . Hypercholesteremia     Past Surgical History:  Procedure Laterality Date  . ABDOMINAL HYSTERECTOMY  1987  . APPENDECTOMY  1963  . BILATERAL OOPHORECTOMY    . BREAST SURGERY  12-28-1988   lumpectomy, 3 x R breats, 2 x Left breast  . SEPTOPLASTY    . TEMPOROMANDIBULAR JOINT SURGERY  12/28/1996    Current Outpatient Medications  Medication Sig Dispense Refill  . atorvastatin (LIPITOR) 40 MG tablet Take 1 tablet (40 mg total) by mouth daily. 90 tablet 3  . omeprazole (PRILOSEC) 20 MG capsule Take 20 mg by mouth as needed.    Marland Kitchen oxybutynin (DITROPAN-XL) 10 MG 24 hr tablet Take 10 mg by mouth daily.  0  . valACYclovir (VALTREX) 500 MG tablet Take 500 mg by mouth daily as needed.  3   No current facility-administered medications for this visit.     Allergies:    Allergies  Allergen Reactions  . Cefaclor Hives  . Codeine     Slows heart rate right down  . Crestor [Rosuvastatin] Nausea And Vomiting    Social History:  The patient  reports that  has never smoked. she has never used smokeless tobacco. She reports that she does not drink alcohol or use drugs.   ROS:  Please see the history of present illness.   Denies any syncope, bleeding, orthopnea, PND or    PHYSICAL EXAM: VS:  BP 140/84   Pulse 67   Ht 5\' 4"  (1.626 m)   Wt 156 lb 1.9 oz (70.8 kg)   LMP  (LMP Unknown)   SpO2 97%   BMI 26.80 kg/m  GEN: Well nourished, well developed, in no acute distress  HEENT: normal  Neck: no JVD, carotid bruits, or masses Cardiac: RRR; no murmurs, rubs, or gallops,no edema  Respiratory:  clear to auscultation bilaterally, normal work of breathing GI: soft, nontender, nondistended, + BS MS: no deformity or atrophy  Skin: warm and dry, no  rash Neuro:  Alert and Oriented x 3, Strength and sensation are intact Psych: euthymic mood, full affect   EKG:  Today 09/07/17-normal sinus rhythm 67 with no other abnormalities personally viewed-prior 05/22/2015-sinus bradycardia rate 54 with no other abnormalities. Personally viewed-prior Sinus rhythm, 61, no other abnormalities     Nuclear stress 9/14-low risk, no ischemia-upsloping ST segment depression, nondiagnostic Labs: As described above  ECHO: 06/19/17 The left and right atrium are moderately enlarged with moderate mitral regurgitation noted. Normal left ventricular size and function.  We will continue to monitor this with echo. Repeat ECHO in one year. Continue statin.   ASSESSMENT AND PLAN:  Chest pain-atypical, right scapular pain. Went to the ER on 10/26/15-troponin normal. Likely musculoskeletal. Recent nuclear stress test reassuring 10-Jan-2013. Continue with aggressive primary prevention. Exercise. She eats for a well, no hormones, mostly vegetarian. Her father in Jan 11, 1967 EKG on 01-11-23 and died on 01/12/23. CT calcium score-15. LAD calcium.  She really has not had any significant chest discomfort.  No significant chest discomfort currently.  Strong family history of CAD-multiple siblings. Father. Primary prevention. Has done an excellent job with atorvastatin 40 mg. This is cut her LDL from 153 down to 70.  No changes.  Hyperlipidemia-LDL. As above.  LDL 85 on 06/27/17.  Reassuring TSH 1.9  Palpitations-occasionally will feel pounding/racing of her heart at night. Fleeting, transient.  We will check a 24-hour Holter monitor. No high risk symptoms such as syncope.  Toe numbness-saw neurology, Dr. Leta Baptist.  He is going to continue to monitor.  She has excellent distal pedal pulses.  Signed, Candee Furbish, MD [facility]  09/07/2017 9:20 AM

## 2017-09-07 NOTE — Patient Instructions (Signed)
Medication Instructions:  The current medical regimen is effective;  continue present plan and medications.  Testing/Procedures: Your physician has recommended that you wear a holter monitor for 24 hr. Holter monitors are medical devices that record the heart's electrical activity. Doctors most often use these monitors to diagnose arrhythmias. Arrhythmias are problems with the speed or rhythm of the heartbeat. The monitor is a small, portable device. You can wear one while you do your normal daily activities. This is usually used to diagnose what is causing palpitations/syncope (passing out).  Follow-Up: Follow up in 1 year with Dr. Marlou Porch.  You will receive a letter in the mail 2 months before you are due.  Please call us when you receive this letter to schedule your follow up appointment.  If you need a refill on your cardiac medications before your next appointment, please call your pharmacy.  Thank you for choosing [facility]!!

## 2017-09-08 ENCOUNTER — Ambulatory Visit (INDEPENDENT_AMBULATORY_CARE_PROVIDER_SITE_OTHER): Payer: Medicare HMO

## 2017-09-08 DIAGNOSIS — R002 Palpitations: Secondary | ICD-10-CM

## 2017-09-26 ENCOUNTER — Telehealth: Payer: Self-pay | Admitting: Diagnostic Neuroimaging

## 2017-09-26 ENCOUNTER — Ambulatory Visit
Admission: RE | Admit: 2017-09-26 | Discharge: 2017-09-26 | Disposition: A | Discharge status: Home / Self Care | Payer: Medicare HMO | Source: Ambulatory Visit | Attending: Family Medicine | Admitting: Family Medicine

## 2017-09-26 DIAGNOSIS — Z1231 Encounter for screening mammogram for malignant neoplasm of breast: Secondary | ICD-10-CM

## 2017-09-26 NOTE — Telephone Encounter (Signed)
-----   Message from [place], Generic sent at 09/26/2017 1:34 PM EST -----    Appointment Request From: Tracey Henson    With Provider: Andrey Spearman, MD [[facility]]    Preferred Date Range: Any date 09/26/2017 or later    Preferred Times: Any    Reason: To address the following health maintenance concerns.  Hepatitis C Screening  Tetanus/Tdap  Colonoscopy  Dexa Scan  Pna Vac Low Risk Adult  Mammogram  Influenza Vaccine    Comments:  Can you please update the shots I have had recently?     Thank you

## 2017-09-27 ENCOUNTER — Other Ambulatory Visit: Payer: Self-pay | Admitting: Family Medicine

## 2017-09-27 DIAGNOSIS — R928 Other abnormal and inconclusive findings on diagnostic imaging of breast: Secondary | ICD-10-CM

## 2017-09-29 ENCOUNTER — Encounter: Payer: Self-pay | Admitting: Cardiology

## 2017-09-29 NOTE — Telephone Encounter (Signed)
Spoke to pt and her email was intended for her pcp, Dr. Nancy Fetter.  She will email her.

## 2017-10-02 ENCOUNTER — Other Ambulatory Visit: Payer: Self-pay | Admitting: Family Medicine

## 2017-10-02 ENCOUNTER — Ambulatory Visit
Admission: RE | Admit: 2017-10-02 | Discharge: 2017-10-02 | Disposition: A | Discharge status: Home / Self Care | Payer: Medicare HMO | Source: Ambulatory Visit | Attending: Family Medicine | Admitting: Family Medicine

## 2017-10-02 DIAGNOSIS — N6310 Unspecified lump in the right breast, unspecified quadrant: Secondary | ICD-10-CM

## 2017-10-02 DIAGNOSIS — R928 Other abnormal and inconclusive findings on diagnostic imaging of breast: Secondary | ICD-10-CM

## 2017-10-02 DIAGNOSIS — N6489 Other specified disorders of breast: Secondary | ICD-10-CM | POA: Diagnosis not present

## 2017-10-13 DIAGNOSIS — H6123 Impacted cerumen, bilateral: Secondary | ICD-10-CM | POA: Diagnosis not present

## 2017-10-13 DIAGNOSIS — J0100 Acute maxillary sinusitis, unspecified: Secondary | ICD-10-CM | POA: Diagnosis not present

## 2017-10-30 DIAGNOSIS — H6123 Impacted cerumen, bilateral: Secondary | ICD-10-CM | POA: Diagnosis not present

## 2017-10-30 DIAGNOSIS — H903 Sensorineural hearing loss, bilateral: Secondary | ICD-10-CM | POA: Diagnosis not present

## 2017-11-06 ENCOUNTER — Other Ambulatory Visit: Payer: Self-pay | Admitting: Cardiology

## 2018-01-11 DIAGNOSIS — H16292 Other keratoconjunctivitis, left eye: Secondary | ICD-10-CM | POA: Diagnosis not present

## 2018-01-27 DIAGNOSIS — S90862A Insect bite (nonvenomous), left foot, initial encounter: Secondary | ICD-10-CM | POA: Diagnosis not present

## 2018-01-29 ENCOUNTER — Encounter (HOSPITAL_COMMUNITY): Payer: Self-pay | Admitting: Emergency Medicine

## 2018-01-29 ENCOUNTER — Emergency Department (HOSPITAL_COMMUNITY)
Admission: EM | Admit: 2018-01-29 | Discharge: 2018-01-29 | Disposition: A | Discharge status: Home / Self Care | Payer: Medicare HMO | Attending: Emergency Medicine | Admitting: Emergency Medicine

## 2018-01-29 ENCOUNTER — Emergency Department (HOSPITAL_COMMUNITY): Payer: Medicare HMO

## 2018-01-29 DIAGNOSIS — R079 Chest pain, unspecified: Secondary | ICD-10-CM | POA: Diagnosis not present

## 2018-01-29 DIAGNOSIS — Z5321 Procedure and treatment not carried out due to patient leaving prior to being seen by health care provider: Secondary | ICD-10-CM | POA: Insufficient documentation

## 2018-01-29 LAB — CBC
HCT: 41.9 % (ref 36.0–46.0)
Hemoglobin: 13.8 g/dL (ref 12.0–15.0)
MCH: 28.6 pg (ref 26.0–34.0)
MCHC: 32.9 g/dL (ref 30.0–36.0)
MCV: 86.9 fL (ref 78.0–100.0)
PLATELETS: 213 10*3/uL (ref 150–400)
RBC: 4.82 MIL/uL (ref 3.87–5.11)
RDW: 13.1 % (ref 11.5–15.5)
WBC: 6.6 10*3/uL (ref 4.0–10.5)

## 2018-01-29 LAB — BASIC METABOLIC PANEL
Anion gap: 12 (ref 5–15)
BUN: 11 mg/dL (ref 6–20)
CALCIUM: 9.8 mg/dL (ref 8.9–10.3)
CHLORIDE: 109 mmol/L (ref 101–111)
CO2: 23 mmol/L (ref 22–32)
CREATININE: 0.85 mg/dL (ref 0.44–1.00)
GFR calc Af Amer: >60 mL/min (ref >60)
GFR calc non Af Amer: >60 mL/min (ref >60)
Glucose, Bld: 101 mg/dL — ABNORMAL HIGH (ref 65–99)
Potassium: 3.5 mmol/L (ref 3.5–5.1)
Sodium: 144 mmol/L (ref 135–145)

## 2018-01-29 LAB — I-STAT TROPONIN, ED: TROPONIN I, POC: 0 ng/mL (ref 0.00–0.08)

## 2018-01-29 MED ORDER — ONDANSETRON 4 MG PO TBDP: 4.0000 mg | ORAL_TABLET | Freq: Once | ORAL | Status: DC

## 2018-01-29 NOTE — ED Triage Notes (Signed)
Pt c/o central chest pains that feels like something is stuck in lower chest/epigastric area since yesterday. Today pains are radiating to jaw. Reports been vomiting as well.  Was seen at [facility] on Saturday for a bite on left second toe and started on antibiotics.

## 2018-01-29 NOTE — ED Notes (Signed)
Pt called from lobby for recheck on vital signs, no answer

## 2018-01-29 NOTE — ED Notes (Signed)
No answer from lobby  

## 2018-01-31 ENCOUNTER — Other Ambulatory Visit: Payer: Self-pay

## 2018-01-31 ENCOUNTER — Emergency Department (HOSPITAL_COMMUNITY): Payer: Medicare HMO

## 2018-01-31 ENCOUNTER — Encounter (HOSPITAL_COMMUNITY): Payer: Self-pay

## 2018-01-31 ENCOUNTER — Emergency Department (HOSPITAL_COMMUNITY)
Admission: EM | Admit: 2018-01-31 | Discharge: 2018-01-31 | Disposition: A | Discharge status: Home / Self Care | Payer: Medicare HMO | Attending: Emergency Medicine | Admitting: Emergency Medicine

## 2018-01-31 DIAGNOSIS — R9431 Abnormal electrocardiogram [ECG] [EKG]: Secondary | ICD-10-CM | POA: Diagnosis not present

## 2018-01-31 DIAGNOSIS — R110 Nausea: Secondary | ICD-10-CM | POA: Diagnosis not present

## 2018-01-31 DIAGNOSIS — R1013 Epigastric pain: Secondary | ICD-10-CM

## 2018-01-31 DIAGNOSIS — R0989 Other specified symptoms and signs involving the circulatory and respiratory systems: Secondary | ICD-10-CM | POA: Diagnosis present

## 2018-01-31 DIAGNOSIS — R1310 Dysphagia, unspecified: Secondary | ICD-10-CM | POA: Diagnosis not present

## 2018-01-31 DIAGNOSIS — Z79899 Other long term (current) drug therapy: Secondary | ICD-10-CM | POA: Diagnosis not present

## 2018-01-31 DIAGNOSIS — R079 Chest pain, unspecified: Secondary | ICD-10-CM | POA: Diagnosis not present

## 2018-01-31 LAB — BASIC METABOLIC PANEL
Anion gap: 8 (ref 5–15)
BUN: 10 mg/dL (ref 6–20)
CO2: 26 mmol/L (ref 22–32)
CREATININE: 0.77 mg/dL (ref 0.44–1.00)
Calcium: 9.5 mg/dL (ref 8.9–10.3)
Chloride: 111 mmol/L (ref 101–111)
GFR calc Af Amer: >60 mL/min (ref >60)
GFR calc non Af Amer: >60 mL/min (ref >60)
GLUCOSE: 87 mg/dL (ref 65–99)
Potassium: 3.5 mmol/L (ref 3.5–5.1)
Sodium: 145 mmol/L (ref 135–145)

## 2018-01-31 LAB — HEPATIC FUNCTION PANEL
ALBUMIN: 4.1 g/dL (ref 3.5–5.0)
ALK PHOS: 68 U/L (ref 38–126)
ALT: 26 U/L (ref 14–54)
AST: 22 U/L (ref 15–41)
Bilirubin, Direct: 0.1 mg/dL (ref 0.1–0.5)
Indirect Bilirubin: 1.1 mg/dL — ABNORMAL HIGH (ref 0.3–0.9)
TOTAL PROTEIN: 6.9 g/dL (ref 6.5–8.1)
Total Bilirubin: 1.2 mg/dL (ref 0.3–1.2)

## 2018-01-31 LAB — I-STAT TROPONIN, ED
Troponin i, poc: 0 ng/mL (ref 0.00–0.08)
Troponin i, poc: 0.01 ng/mL (ref 0.00–0.08)

## 2018-01-31 LAB — CBC
HCT: 41.9 % (ref 36.0–46.0)
Hemoglobin: 13.5 g/dL (ref 12.0–15.0)
MCH: 27.7 pg (ref 26.0–34.0)
MCHC: 32.2 g/dL (ref 30.0–36.0)
MCV: 86 fL (ref 78.0–100.0)
PLATELETS: 232 10*3/uL (ref 150–400)
RBC: 4.87 MIL/uL (ref 3.87–5.11)
RDW: 12.8 % (ref 11.5–15.5)
WBC: 7.5 10*3/uL (ref 4.0–10.5)

## 2018-01-31 LAB — GROUP A STREP BY PCR: Group A Strep by PCR: NOT DETECTED

## 2018-01-31 LAB — LIPASE, BLOOD: Lipase: 35 U/L (ref 11–51)

## 2018-01-31 MED ORDER — GI COCKTAIL ~~LOC~~
30.0000 mL | Freq: Once | ORAL | Status: AC
  Administered 2018-01-31: 30 mL via ORAL
  Filled 2018-01-31: qty 30

## 2018-01-31 MED ORDER — IOPAMIDOL (ISOVUE-370) INJECTION 76%
INTRAVENOUS | Status: AC
  Filled 2018-01-31: qty 100

## 2018-01-31 MED ORDER — SODIUM CHLORIDE 0.9 % IV BOLUS
1000.0000 mL | Freq: Once | INTRAVENOUS | Status: AC
  Administered 2018-01-31: 1000 mL via INTRAVENOUS

## 2018-01-31 MED ORDER — IOPAMIDOL (ISOVUE-370) INJECTION 76%
100.0000 mL | Freq: Once | INTRAVENOUS | Status: AC | PRN
  Administered 2018-01-31: 100 mL via INTRAVENOUS

## 2018-01-31 NOTE — ED Notes (Signed)
Returned from CT.

## 2018-01-31 NOTE — ED Notes (Signed)
Rec's printed out and sent to main lab to request add on

## 2018-01-31 NOTE — ED Provider Notes (Signed)
[facility] EMERGENCY DEPARTMENT Provider Note   CSN: [id number] Arrival date & time: 01/31/18  1239     History   Chief Complaint Chief Complaint  Patient presents with  . Nausea    HPI  Tracey Henson is a 67 y.o. female who presents today from her GI office.  She reports that since Saturday at midnight she has had pain in her throat.  She reports that it feels like when you swallow something and it gets stuck and unable to get it dislodged.  She reports that since that started she has lost approximately 6 pounds.  She reports that she went to [facility] today and she reports that they sent her over here for a cardiac work-up and a barium swallow study.  She reports increased belching.  She has not found anything that makes her pain better or worse.  HPI  Past Medical History:  Diagnosis Date  . Baker cyst    left leg  . Breast cyst   . GERD (gastroesophageal reflux disease)   . Hearing loss   . Hypercholesteremia     Patient Active Problem List   Diagnosis Date Noted  . Multiple pulmonary nodules 06/10/2015  . Upper airway cough syndrome 06/08/2015  . Dyspnea 06/08/2015  . Hyperlipidemia 04/09/2015  . Family history of ischemic heart disease 12/09/2013    Past Surgical History:  Procedure Laterality Date  . ABDOMINAL HYSTERECTOMY  1987  . APPENDECTOMY  1963  . BILATERAL OOPHORECTOMY    . BREAST CYST EXCISION Bilateral pt unsure  . BREAST SURGERY  1990,1991   lumpectomy, 3 x R breats, 2 x Left breast  . SEPTOPLASTY    . TEMPOROMANDIBULAR JOINT SURGERY  1998     OB History   None      Home Medications    Prior to Admission medications   Medication Sig Start Date End Date Taking? Authorizing Provider  atorvastatin (LIPITOR) 40 MG tablet TAKE 1 TABLET (40 MG TOTAL) BY MOUTH DAILY. 11/06/17   Jerline Pain, MD  omeprazole (PRILOSEC) 20 MG capsule Take 20 mg by mouth as needed.    [provider]  oxybutynin (DITROPAN-XL) 10 MG 24  hr tablet Take 10 mg by mouth daily. 08/27/17   [provider]  valACYclovir (VALTREX) 500 MG tablet Take 500 mg by mouth daily as needed. 06/27/17   [provider]    Family History Family History  Problem Relation Age of Onset  . Lung cancer Mother        smoked  . Heart attack Mother   . Coronary artery disease Mother   . Heart disease Sister   . Hypertension Sister   . Stroke Maternal Grandmother   . Heart attack Maternal Grandfather   . Coronary artery disease Maternal Grandfather   . Heart attack Father   . Coronary artery disease Father   . Hypertension Brother   . Cancer Paternal Grandmother   . Cancer Maternal Aunt        breast    Social History Social History   Tobacco Use  . Smoking status: Never Smoker  . Smokeless tobacco: Never Used  Substance Use Topics  . Alcohol use: No    Alcohol/week: 0.0 oz    Comment: Occassional wine  . Drug use: No     Allergies   Cefaclor; Codeine; and Crestor [rosuvastatin]   Review of Systems Review of Systems  Constitutional: Negative for chills and fever.  HENT: Negative  for ear pain, sore throat and trouble swallowing (Pain with swallowing).   Eyes: Negative for pain and visual disturbance.  Respiratory: Negative for cough and shortness of breath.   Cardiovascular: Negative for chest pain and palpitations.       Epigastric pressure  Gastrointestinal: Negative for abdominal pain, diarrhea and vomiting.  Genitourinary: Negative for dysuria and hematuria.  Musculoskeletal: Negative for arthralgias and back pain.  Skin: Negative for color change and rash.  Neurological: Negative for seizures and syncope.  All other systems reviewed and are negative.    Physical Exam Updated Vital Signs BP (!) 118/54   Pulse (!) 58   Temp 98 F (36.7 C) (Oral)   Resp (!) 9   LMP  (LMP Unknown)   SpO2 97%   Physical Exam  Constitutional: She is oriented to person, place, and time. She appears  well-developed and well-nourished. No distress.  HENT:  Head: Normocephalic and atraumatic.  Mouth/Throat: Uvula is midline, oropharynx is clear and moist and mucous membranes are normal.  Eyes: Conjunctivae are normal. Right eye exhibits no discharge. Left eye exhibits no discharge. No scleral icterus.  Neck: Normal range of motion. Neck supple.  Cardiovascular: Normal rate, regular rhythm, normal heart sounds and intact distal pulses. Exam reveals no friction rub.  No murmur heard. Pulmonary/Chest: Effort normal and breath sounds normal. No stridor. No respiratory distress.  Abdominal: Soft. Bowel sounds are normal. She exhibits no distension. There is no tenderness. There is no guarding.  Musculoskeletal: She exhibits no edema or deformity.  Lymphadenopathy:    She has no cervical adenopathy.  Neurological: She is alert and oriented to person, place, and time. She exhibits normal muscle tone.  Skin: Skin is warm and dry. She is not diaphoretic.  Psychiatric: She has a normal mood and affect. Her behavior is normal.  Nursing note and vitals reviewed.    ED Treatments / Results  Labs (all labs ordered are listed, but only abnormal results are displayed) Labs Reviewed  HEPATIC FUNCTION PANEL - Abnormal; Notable for the following components:      Result Value   Indirect Bilirubin 1.1 (*)    All other components within normal limits  GROUP A STREP BY PCR  BASIC METABOLIC PANEL  CBC  LIPASE, BLOOD  I-STAT TROPONIN, ED  I-STAT TROPONIN, ED    EKG None  Radiology Dg Chest 2 View  Result Date: 01/31/2018 CLINICAL DATA:  Chest pain for 2 days EXAM: CHEST - 2 VIEW COMPARISON:  01/29/2018 FINDINGS: Cardiac shadow is within normal limits. The lungs are well aerated bilaterally. No focal infiltrate or sizable effusion is seen. Pleural and parenchymal scarring is noted. The known nodule is less well visualized on today's exam. No bony abnormality is noted. IMPRESSION: No acute  abnormality noted. Electronically Signed   By: Inez Catalina M.D.   On: 01/31/2018 14:06   Ct Angio Chest/abd/pel For Dissection W And/or W/wo  Result Date: 01/31/2018 CLINICAL DATA:  Painful swallowing since Saturday.  Nausea. EXAM: CT ANGIOGRAPHY CHEST, ABDOMEN AND PELVIS TECHNIQUE: Multidetector CT imaging through the chest, abdomen and pelvis was performed using the standard protocol during bolus administration of intravenous contrast. Multiplanar reconstructed images and MIPs were obtained and reviewed to evaluate the vascular anatomy. CONTRAST:  193mL ISOVUE-370 IOPAMIDOL (ISOVUE-370) INJECTION 76% COMPARISON:  Chest CT dated 05/31/2017. FINDINGS: CTA CHEST FINDINGS Cardiovascular: No thoracic aortic aneurysm or dissection. Heart size is normal. No pericardial effusion. No pulmonary embolism seen within the main, lobar or segmental pulmonary  arteries bilaterally. Mediastinum/Nodes: No mass or enlarged lymph nodes seen within the mediastinum. Mildly prominent lymph nodes within the RIGHT hilum, measuring up to 1.5 cm short axis dimension. Esophagus appears normal. Trachea and central bronchi are unremarkable. Lungs/Pleura: 8 mm pulmonary nodule within the anterior aspects of the RIGHT upper lobe (series 9, image 35). Additional 4 mm pleural based nodule within the lateral aspects of the RIGHT upper lobe (series 9, image 58). Bilateral pleuroparenchymal scarring/fibrosis at the lung apices. No pleural effusion or pneumothorax. Musculoskeletal: No chest wall abnormality. No acute or significant osseous findings. Review of the MIP images confirms the above findings. CTA ABDOMEN AND PELVIS FINDINGS VASCULAR Aorta: Normal caliber aorta without aneurysm, dissection, vasculitis or significant stenosis. Aortic atherosclerosis. Celiac: Patent without evidence of aneurysm, dissection, vasculitis or significant stenosis. SMA: Patent without evidence of aneurysm, dissection, vasculitis or significant stenosis. Renals:  Both renal arteries are patent without evidence of aneurysm, dissection, vasculitis, fibromuscular dysplasia or significant stenosis. IMA: Patent without evidence of aneurysm, dissection, vasculitis or significant stenosis. Inflow: Patent without evidence of aneurysm, dissection, vasculitis or significant stenosis. Veins: No obvious venous abnormality within the limitations of this arterial phase study. Review of the MIP images confirms the above findings. NON-VASCULAR Hepatobiliary: No focal liver abnormality is seen. No gallstones, gallbladder wall thickening, or biliary dilatation. Pancreas: Unremarkable. No pancreatic ductal dilatation or surrounding inflammatory changes. Spleen: Normal in size without focal abnormality. Adrenals/Urinary Tract: Adrenal glands are unremarkable. Kidneys are normal, without renal calculi, focal lesion, or hydronephrosis. Bladder is unremarkable. Stomach/Bowel: Bowel is normal in caliber. No bowel wall thickening or evidence of bowel wall inflammation seen. Stomach is unremarkable, partially decompressed. Lymphatic: No enlarged lymph nodes seen. Reproductive: Presumed hysterectomy. No adnexal mass or free fluid seen. Other: No free fluid or abscess collection. No free intraperitoneal air. Musculoskeletal: No acute or significant osseous finding. Review of the MIP images confirms the above findings. IMPRESSION: 1. 8 mm pulmonary nodule within the anterior aspect of the RIGHT upper lobe, perhaps slightly increased in size compared to the earlier chest CT of 05/31/2017 where it was measured at 7 mm greatest dimension. Slow-growing neoplastic nodule is not excluded. Recommend follow-up contrast enhanced chest CT in 6 months. If the nodule is stable at time of repeat CT, then future CT at 18-24 months (from today's scan) is considered optional for low-risk patients, but is recommended for high-risk patients. This recommendation follows the consensus statement: Guidelines for Management  of Incidental Pulmonary Nodules Detected on CT Images: From the Fleischner Society 2017; Radiology 2017; 284:228-243. If the pulmonary nodule, or the RIGHT hilar lymphadenopathy described below, increases on subsequent chest CT, a PET-CT or tissue sampling would then likely be needed for definitive characterization. 2. Mildly prominent lymph nodes within the RIGHT hilum, not convincingly seen on the earlier noncontrast CT of 05/31/2017, possibly reactive in nature. This can also be reassessed on the follow-up contrast enhanced chest CT recommended above. 3. Remainder of the chest CT is unremarkable. Esophagus appears normal. No mass or inflammatory change within the mediastinum. No thoracic aortic aneurysm or dissection. 4. No acute findings within the abdomen or pelvis. No abdominal aortic aneurysm or dissection. 5. Aortic atherosclerosis. Electronically Signed   By: Franki Cabot M.D.   On: 01/31/2018 19:18    Procedures Procedures (including critical care time)  Medications Ordered in ED Medications  sodium chloride 0.9 % bolus 1,000 mL (0 mLs Intravenous Stopped 01/31/18 2134)  iopamidol (ISOVUE-370) 76 % injection 100 mL (100 mLs Intravenous Contrast  Given 01/31/18 1809)  gi cocktail (Maalox,Lidocaine,Donnatal) (30 mLs Oral Given 01/31/18 2103)     Initial Impression / Assessment and Plan / ED Course  I have reviewed the triage vital signs and the nursing notes.  Pertinent labs & imaging results that were available during my care of the patient were reviewed by me and considered in my medical decision making (see chart for details).  Clinical Course as of Jan 31 2310  Wed Jan 31, 2018  2010 Spoke with Dr. on call for Howie Ill.  He requests starting her on pump inhibitor, if she is already on it then twice a day and to follow up with Dr. Michail Sermon tomorrow after 830.    [EH]    Clinical Course User Index [EH] Lorin Glass, PA-C   Patient presents to the emergency room today, after  being referred by her GI doctor, for evaluation of 2 to 3 days of epigastric pain.  GI doctor was concerned for dissection versus cardiac cause of her pain.  Troponin x2 obtained which was normal.  Lipase 35, as patient reported pain with swallowing strep test was obtained which was negative.  She was with out leukocytosis, significant anemia or electrolyte abnormalities.  Kidney and liver function appear normal.  CT dissection was obtained with incidental findings of lung nodules, no cause for symptoms found.  I spoke with GI on-call who does not feel like patient requires admission, requested to start her on a proton pump inhibitor and call her primary GIs doctor tomorrow morning for a follow-up appointment.  Patient was given a GI cocktail which fully relieved her symptoms for approximately 30 minutes, after which they came back.  As she has pain with swallowing she has had decreased p.o. intake, she was given 1 L of IV fluids to help offset any dehydration.  This patient was seen as a shared visit with Dr. Pearline Cables who evaluated patient and agreed with plan.  Patient discharged home with appropriate return precautions and outpatient follow-up.  Patient was informed of all incidental findings on her CT scan Including lung nodules.  Final Clinical Impressions(s) / ED Diagnoses   Final diagnoses:  Epigastric pain    ED Discharge Orders    None       Ollen Gross 01/31/18 2316    Pattricia Boss, MD 02/04/18 1040

## 2018-01-31 NOTE — ED Triage Notes (Signed)
Pt states she has had painful swallowing since Saturday. Pt also reports nausea, saliva and liquids hurt as well. Pt sent over to have cardiac workup as well as a barium swallow study.

## 2018-01-31 NOTE — Discharge Instructions (Addendum)
Please call Dr.Schooler's office tomorrow morning to schedule a follow-up appointment.  Please start taking prilosec to see if that will help your symptoms.  Please make sure you are staying well hydrated.     This is the radiologist read on your scan.  Please show it to your primary care doctor.  You need a chest CT in 6 months.    1. 8 mm pulmonary nodule within the anterior aspect of the RIGHT  upper lobe, perhaps slightly increased in size compared to the  earlier chest CT of 05/31/2017 where it was measured at 7 mm  greatest dimension. Slow-growing neoplastic nodule is not excluded.  Recommend follow-up contrast enhanced chest CT in 6 months. If the  nodule is stable at time of repeat CT, then future CT at 18-24  months (from today's scan) is considered optional for low-risk  patients, but is recommended for high-risk patients. This  recommendation follows the consensus statement: Guidelines for  Management of Incidental Pulmonary Nodules Detected on CT Images:  From the Fleischner Society 2017; Radiology 2017; 284:228-243. If  the pulmonary nodule, or the RIGHT hilar lymphadenopathy described  below, increases on subsequent chest CT, a PET-CT or tissue sampling  would then likely be needed for definitive characterization.  2. Mildly prominent lymph nodes within the RIGHT hilum, not  convincingly seen on the earlier noncontrast CT of 05/31/2017,  possibly reactive in nature. This can also be reassessed on the  follow-up contrast enhanced chest CT recommended above.  3. Remainder of the chest CT is unremarkable. Esophagus appears  normal. No mass or inflammatory change within the mediastinum. No  thoracic aortic aneurysm or dissection.  4. No acute findings within the abdomen or pelvis. No abdominal  aortic aneurysm or dissection.  5. Aortic atherosclerosis.

## 2018-01-31 NOTE — ED Notes (Signed)
Pt stable, ambulatory, states understanding of discharge instructions 

## 2018-02-09 DIAGNOSIS — R1310 Dysphagia, unspecified: Secondary | ICD-10-CM | POA: Diagnosis not present

## 2018-02-09 DIAGNOSIS — K228 Other specified diseases of esophagus: Secondary | ICD-10-CM | POA: Diagnosis not present

## 2018-02-09 DIAGNOSIS — R12 Heartburn: Secondary | ICD-10-CM | POA: Diagnosis not present

## 2018-02-14 DIAGNOSIS — K228 Other specified diseases of esophagus: Secondary | ICD-10-CM | POA: Diagnosis not present

## 2018-02-27 DIAGNOSIS — Z23 Encounter for immunization: Secondary | ICD-10-CM | POA: Diagnosis not present

## 2018-02-27 DIAGNOSIS — S91339A Puncture wound without foreign body, unspecified foot, initial encounter: Secondary | ICD-10-CM | POA: Diagnosis not present

## 2018-03-26 ENCOUNTER — Other Ambulatory Visit: Payer: Medicare HMO

## 2018-03-28 ENCOUNTER — Other Ambulatory Visit: Payer: Self-pay | Admitting: Family Medicine

## 2018-03-28 ENCOUNTER — Ambulatory Visit
Admission: RE | Admit: 2018-03-28 | Discharge: 2018-03-28 | Disposition: A | Discharge status: Home / Self Care | Payer: Medicare HMO | Source: Ambulatory Visit | Attending: Family Medicine | Admitting: Family Medicine

## 2018-03-28 DIAGNOSIS — N6310 Unspecified lump in the right breast, unspecified quadrant: Secondary | ICD-10-CM

## 2018-03-28 DIAGNOSIS — R928 Other abnormal and inconclusive findings on diagnostic imaging of breast: Secondary | ICD-10-CM | POA: Diagnosis not present

## 2018-03-28 DIAGNOSIS — N6011 Diffuse cystic mastopathy of right breast: Secondary | ICD-10-CM | POA: Diagnosis not present

## 2018-04-20 ENCOUNTER — Other Ambulatory Visit: Payer: Self-pay | Admitting: Internal Medicine

## 2018-04-20 DIAGNOSIS — R918 Other nonspecific abnormal finding of lung field: Secondary | ICD-10-CM

## 2018-04-20 DIAGNOSIS — K219 Gastro-esophageal reflux disease without esophagitis: Secondary | ICD-10-CM | POA: Diagnosis not present

## 2018-05-07 DIAGNOSIS — L84 Corns and callosities: Secondary | ICD-10-CM | POA: Diagnosis not present

## 2018-05-07 DIAGNOSIS — R5381 Other malaise: Secondary | ICD-10-CM | POA: Diagnosis not present

## 2018-05-17 ENCOUNTER — Encounter: Payer: Self-pay | Admitting: Podiatry

## 2018-05-17 ENCOUNTER — Other Ambulatory Visit: Payer: Self-pay | Admitting: Podiatry

## 2018-05-17 ENCOUNTER — Ambulatory Visit (INDEPENDENT_AMBULATORY_CARE_PROVIDER_SITE_OTHER): Payer: Medicare HMO | Admitting: Podiatry

## 2018-05-17 ENCOUNTER — Ambulatory Visit (INDEPENDENT_AMBULATORY_CARE_PROVIDER_SITE_OTHER): Payer: Medicare HMO

## 2018-05-17 DIAGNOSIS — L923 Foreign body granuloma of the skin and subcutaneous tissue: Secondary | ICD-10-CM | POA: Diagnosis not present

## 2018-05-17 DIAGNOSIS — M25571 Pain in right ankle and joints of right foot: Secondary | ICD-10-CM

## 2018-05-17 DIAGNOSIS — M779 Enthesopathy, unspecified: Secondary | ICD-10-CM | POA: Diagnosis not present

## 2018-05-17 MED ORDER — TRIAMCINOLONE ACETONIDE 10 MG/ML IJ SUSP
10.0000 mg | Freq: Once | INTRAMUSCULAR | Status: AC
  Administered 2018-05-17: 10 mg

## 2018-05-17 NOTE — Progress Notes (Signed)
Subjective:   Patient ID: Tracey Henson, female   DOB: 67 y.o.   MRN: [id number]   HPI Patient states she had a puncture wound at the bottom of her left heel in June and it has started to bother her again and she did have antibiotics and a tetanus shot for the right she has inflammation of the ankle and thinks she might be walking differently because of pain in the left.  Patient does not smoke likes to be active   Review of Systems  All other systems reviewed and are negative.       Objective:  Physical Exam  Constitutional: She appears well-developed and well-nourished.  Cardiovascular: Intact distal pulses.  Pulmonary/Chest: Effort normal.  Musculoskeletal: Normal range of motion.  Neurological: She is alert.  Skin: Skin is warm.  Nursing note and vitals reviewed.   Neurovascular status intact muscle strength adequate range of motion within normal limits with patient noted to have a lesion bottom of the left heel that has a dark spot in the center and does not have drainage or erythema edema surrounding it and on the right I noted there to be quite a bit of discomfort in the sinus tarsi.  Patient has good digital perfusion well oriented x3     Assessment:  Inflammatory condition left with possibility for foreign body reaction and sinus tarsitis right     Plan:  H&P conditions reviewed and for the left using sharp sterile technique I debrided the lesion out and did under sterile prep and did not note any active drainage and applied sterile dressing.  For the  X-rays did not indicate foreign body or metallic piece left in for the right indicated no indications of arthritis of the joint reviewed the x-ray and I then injected the sinus tarsi 3 mg Kenalog 5 mg Xylocaine

## 2018-05-17 NOTE — Progress Notes (Signed)
   Subjective:    Patient ID: Tracey Henson, female    DOB: September 25, 1950, 67 y.o.   MRN: [id number]  HPI    Review of Systems  All other systems reviewed and are negative.      Objective:   Physical Exam        Assessment & Plan:

## 2018-05-24 DIAGNOSIS — R252 Cramp and spasm: Secondary | ICD-10-CM | POA: Diagnosis not present

## 2018-05-24 DIAGNOSIS — E7800 Pure hypercholesterolemia, unspecified: Secondary | ICD-10-CM | POA: Diagnosis not present

## 2018-05-24 DIAGNOSIS — R51 Headache: Secondary | ICD-10-CM | POA: Diagnosis not present

## 2018-05-24 DIAGNOSIS — M791 Myalgia, unspecified site: Secondary | ICD-10-CM | POA: Diagnosis not present

## 2018-05-24 DIAGNOSIS — Z23 Encounter for immunization: Secondary | ICD-10-CM | POA: Diagnosis not present

## 2018-06-01 ENCOUNTER — Ambulatory Visit (INDEPENDENT_AMBULATORY_CARE_PROVIDER_SITE_OTHER)
Admission: RE | Admit: 2018-06-01 | Discharge: 2018-06-01 | Disposition: A | Discharge status: Home / Self Care | Payer: Medicare HMO | Source: Ambulatory Visit | Attending: Internal Medicine | Admitting: Internal Medicine

## 2018-06-01 DIAGNOSIS — R918 Other nonspecific abnormal finding of lung field: Secondary | ICD-10-CM | POA: Diagnosis not present

## 2018-06-04 NOTE — Progress Notes (Signed)
Spoke with pt and notified of results per Dr. Wert. Pt verbalized understanding and denied any questions. 

## 2018-06-11 ENCOUNTER — Other Ambulatory Visit: Payer: Self-pay | Admitting: Family Medicine

## 2018-06-11 ENCOUNTER — Ambulatory Visit
Admission: RE | Admit: 2018-06-11 | Discharge: 2018-06-11 | Disposition: A | Discharge status: Home / Self Care | Payer: Worker's Compensation | Source: Ambulatory Visit | Attending: Family Medicine | Admitting: Family Medicine

## 2018-06-11 DIAGNOSIS — S7002XA Contusion of left hip, initial encounter: Secondary | ICD-10-CM

## 2018-06-18 DIAGNOSIS — H02889 Meibomian gland dysfunction of unspecified eye, unspecified eyelid: Secondary | ICD-10-CM | POA: Diagnosis not present

## 2018-06-18 DIAGNOSIS — H04123 Dry eye syndrome of bilateral lacrimal glands: Secondary | ICD-10-CM | POA: Diagnosis not present

## 2018-06-19 ENCOUNTER — Telehealth: Payer: Self-pay | Admitting: Cardiology

## 2018-06-19 NOTE — Telephone Encounter (Signed)
Stop atorvastatin.  Start Livalo 2 mg p.o. once a day. Candee Furbish, MD

## 2018-06-19 NOTE — Telephone Encounter (Signed)
Pt stopped atorvastatin d/t muscle pain.  The pain resolved after being off the medication for 4 days.  She would like to know what Dr Glori Bickers like her to take for her cholesterol since she had this reaction to atorvastatin.  Aware I will review with MD and c/b

## 2018-06-19 NOTE — Telephone Encounter (Signed)
New Message:   Patient calling she has concerns about some medication Atorvastatin 40 mg primary  Dr. Moreen Fowler he took her off due to muscle spasms. Patient all stated that last week her BP bottom number has been low it is staying the same. Pleas call PT.

## 2018-06-21 MED ORDER — PITAVASTATIN CALCIUM 2 MG PO TABS: 2.0000 mg | ORAL_TABLET | Freq: Every day | ORAL | 6 refills | Status: DC

## 2018-06-21 NOTE — Telephone Encounter (Signed)
Pt aware of orders to start Livalo 2 mg daily.  Rx sent into pharmacy.  She will c/b if any questions or concerns.

## 2018-06-21 NOTE — Addendum Note (Signed)
Addended by: Shellia Cleverly on: 06/21/2018 05:56 PM   Modules accepted: Orders

## 2018-06-25 ENCOUNTER — Telehealth: Payer: Self-pay | Admitting: Cardiology

## 2018-06-25 DIAGNOSIS — E7801 Familial hypercholesterolemia: Secondary | ICD-10-CM

## 2018-06-25 NOTE — Telephone Encounter (Signed)
Will forward to Dr Skains for review and orders 

## 2018-06-25 NOTE — Telephone Encounter (Signed)
Lets go ahead and have some assistance through the lipid clinic.  Please set her up with an appointment.  Appreciate their assistance. Candee Furbish, MD

## 2018-06-25 NOTE — Telephone Encounter (Signed)
New Message:     Pt said Dr Gillian Shields is try to start her on  a new Cholesterol medicine. She went to pick it up and it was too expensive, it was going to be around $300.00. She says she can not afford it, she wants to know if he can prescribe something else.

## 2018-06-26 NOTE — Telephone Encounter (Signed)
Left message for pt to call back to discuss being seen in the Lipid Clinic.

## 2018-06-27 NOTE — Telephone Encounter (Signed)
Pt aware she is being referred to Lipid Clinic here.  Aware she will be called with an appointment.

## 2018-06-29 ENCOUNTER — Ambulatory Visit
Admission: RE | Admit: 2018-06-29 | Discharge: 2018-06-29 | Disposition: A | Discharge status: Home / Self Care | Payer: Worker's Compensation | Source: Ambulatory Visit | Attending: Family Medicine | Admitting: Family Medicine

## 2018-06-29 ENCOUNTER — Other Ambulatory Visit: Payer: Self-pay | Admitting: Family Medicine

## 2018-06-29 DIAGNOSIS — W19XXXA Unspecified fall, initial encounter: Secondary | ICD-10-CM

## 2018-06-29 DIAGNOSIS — M25561 Pain in right knee: Secondary | ICD-10-CM

## 2018-06-29 DIAGNOSIS — E785 Hyperlipidemia, unspecified: Secondary | ICD-10-CM | POA: Diagnosis not present

## 2018-06-29 DIAGNOSIS — Z1389 Encounter for screening for other disorder: Secondary | ICD-10-CM | POA: Diagnosis not present

## 2018-06-29 DIAGNOSIS — I700 Atherosclerosis of aorta: Secondary | ICD-10-CM | POA: Diagnosis not present

## 2018-06-29 DIAGNOSIS — A6004 Herpesviral vulvovaginitis: Secondary | ICD-10-CM | POA: Diagnosis not present

## 2018-06-29 DIAGNOSIS — N3281 Overactive bladder: Secondary | ICD-10-CM | POA: Diagnosis not present

## 2018-06-29 DIAGNOSIS — Z0000 Encounter for general adult medical examination without abnormal findings: Secondary | ICD-10-CM | POA: Diagnosis not present

## 2018-07-12 ENCOUNTER — Ambulatory Visit (INDEPENDENT_AMBULATORY_CARE_PROVIDER_SITE_OTHER): Payer: Medicare HMO | Admitting: Pharmacist

## 2018-07-12 DIAGNOSIS — E7801 Familial hypercholesterolemia: Secondary | ICD-10-CM | POA: Diagnosis not present

## 2018-07-12 MED ORDER — PRAVASTATIN SODIUM 40 MG PO TABS: 40.0000 mg | ORAL_TABLET | Freq: Every evening | ORAL | 3 refills | Status: DC

## 2018-07-12 NOTE — Progress Notes (Signed)
Patient ID: Tracey Henson                 DOB: 1950/12/19                    MRN: [id number]     HPI: Tracey Henson is a 67 y.o. female patient of Dr. Marlou Porch that presents today for lipid evaluation.  PMH includes strong family history of coronary artery disease with 13 siblings with CAD who underwent nuclear stress test 9/14 that overall was low risk but no ischemia however she did have upsloping ST segment depression on EKG portion. Her ASCVD 10-year risk score of 8.7% and falls in the statin benefit group.   She presents today for discussion of cholesterol. She states that she has been off of statin medication for about 1 month and she wonders what her levels will be as they are borderline high when she is on therapy. She states that she took atorvastatin for several years and then developed aches and pains and muscle spasms. She had several work ups to determine the cause and eventually her statin was discontinued. She states that within 5 days her symptoms resolved. She also tried Crestor which caused her to become severely nauseated after just a few doses. She states that Livalo was $300 for a one month supply.   Risk Factors: Strong family history of CAD, HTN  LDL Goal: <100  Current Medications: none Intolerances: Livalo (cost-prohibitive), atorvastatin 40mg  daily (muscle spasms which improved after 5 days off there therapy), Crestor (Vomiting after one dose)  Diet: Most meals from home. She eats more chicken and fish. She eats steak once per month. She bakes or grills. She does not fry foods. She loves vegetables raw or cooked. She eats pasta about once per 10 days. She eats rice occasionally. She drinks water, tea, or coffee with sugar.   Exercise: She walks 3 times per day at least a few miles per day. She is active at work as Buyer, retail and Advertising copywriter.   Family History:   Social History: The patient  reports that  has never smoked. she has never used smokeless tobacco.  She reports that she does not drink alcohol or use drugs.  Labs: 06/27/17: TC 157, TG 136, HDL 45, LDL 85, nonHDL 112  Past Medical History:  Diagnosis Date  . Baker cyst    left leg  . Breast cyst   . GERD (gastroesophageal reflux disease)   . Hearing loss   . Hypercholesteremia     Current Outpatient Medications on File Prior to Visit  Medication Sig Dispense Refill  . omeprazole (PRILOSEC) 20 MG capsule Take 20 mg by mouth as needed.    Marland Kitchen oxybutynin (DITROPAN-XL) 10 MG 24 hr tablet Take 10 mg by mouth daily.  0  . Pitavastatin Calcium 2 MG TABS Take 1 tablet (2 mg total) by mouth daily. 30 tablet 6  . valACYclovir (VALTREX) 500 MG tablet Take 500 mg by mouth daily as needed.  3   No current facility-administered medications on file prior to visit.     Allergies  Allergen Reactions  . Cefaclor Hives  . Codeine     Slows heart rate right down  . Crestor [Rosuvastatin] Nausea And Vomiting    Assessment/Plan: Hyperlipidemia: Will repeat lipid panel today for baseline off therapy. Discussed options and patient would prefer oral option as well as cheaper alternative if possible. She is willing to challenge with another  statin medication. Will start pravastatin 40mg  daily. Recheck lipid/hepatic panel in 2 months. She will call with any tolerability issues.    Thank you,  Lelan Pons. Patterson Hammersmith, [facility]  07/12/2018 7:52 AM

## 2018-07-12 NOTE — Patient Instructions (Addendum)
Go to the lab today (lipid and hepatic)  START pravastatin 40mg  once daily. If tolerating well we will repeat lipids and hepatic panel in 2 months.   Please call the clinic if you have any issues [phone].    Cholesterol Cholesterol is a fat. Your body needs a small amount of cholesterol. Cholesterol (plaque) may build up in your blood vessels (arteries). That makes you more likely to have a heart attack or stroke. You cannot feel your cholesterol level. Having a blood test is the only way to find out if your level is high. Keep your test results. Work with your doctor to keep your cholesterol at a good level. What do the results mean?  Total cholesterol is how much cholesterol is in your blood.  LDL is bad cholesterol. This is the type that can build up. Try to have low LDL.  HDL is good cholesterol. It cleans your blood vessels and carries LDL away. Try to have high HDL.  Triglycerides are fat that the body can store or burn for energy. What are good levels of cholesterol?  Total cholesterol below 200.  LDL below 100 is good for people who have health risks. LDL below 70 is good for people who have very high risks.  HDL above 40 is good. It is best to have HDL of 60 or higher.  Triglycerides below 150. How can I lower my cholesterol? Diet Follow your diet program as told by your doctor.  Choose fish, white meat chicken, or [place] that is roasted or baked. Try not to eat red meat, fried foods, sausage, or lunch meats.  Eat lots of fresh fruits and vegetables.  Choose whole grains, beans, pasta, potatoes, and cereals.  Choose olive oil, corn oil, or canola oil. Only use small amounts.  Try not to eat butter, mayonnaise, shortening, or palm kernel oils.  Try not to eat foods with trans fats.  Choose low-fat or nonfat dairy foods. ? Drink skim or nonfat milk. ? Eat low-fat or nonfat yogurt and cheeses. ? Try not to drink whole milk or cream. ? Try not to eat ice  cream, egg yolks, or full-fat cheeses.  Healthy desserts include angel food cake, ginger snaps, animal crackers, hard candy, popsicles, and low-fat or nonfat frozen yogurt. Try not to eat pastries, cakes, pies, and cookies.  Exercise Follow your exercise program as told by your doctor.  Be more active. Try gardening, walking, and taking the stairs.  Ask your doctor about ways that you can be more active.  Medicine  Take over-the-counter and prescription medicines only as told by your doctor. This information is not intended to replace advice given to you by your health care provider. Make sure you discuss any questions you have with your health care provider. Document Released: 12/02/2008 Document Revised: 04/06/2016 Document Reviewed: 03/17/2016 Elsevier Interactive Patient Education  [employer].

## 2018-07-13 ENCOUNTER — Encounter: Payer: Self-pay | Admitting: Pharmacist

## 2018-07-13 LAB — HEPATIC FUNCTION PANEL
ALT: 22 IU/L (ref 0–32)
AST: 19 IU/L (ref 0–40)
Albumin: 4.2 g/dL (ref 3.6–4.8)
Alkaline Phosphatase: 86 IU/L (ref 39–117)
BILIRUBIN TOTAL: 0.3 mg/dL (ref 0.0–1.2)
Bilirubin, Direct: 0.09 mg/dL (ref 0.00–0.40)
TOTAL PROTEIN: 6.4 g/dL (ref 6.0–8.5)

## 2018-07-13 LAB — LIPID PANEL
Chol/HDL Ratio: 5.2 ratio — ABNORMAL HIGH (ref 0.0–4.4)
Cholesterol, Total: 248 mg/dL — ABNORMAL HIGH (ref 100–199)
HDL: 48 mg/dL (ref >39)
LDL CALC: 174 mg/dL — AB (ref 0–99)
TRIGLYCERIDES: 130 mg/dL (ref 0–149)
VLDL Cholesterol Cal: 26 mg/dL (ref 5–40)

## 2018-08-02 ENCOUNTER — Encounter: Payer: Self-pay | Admitting: *Deleted

## 2018-08-06 ENCOUNTER — Encounter: Payer: Self-pay | Admitting: Diagnostic Neuroimaging

## 2018-08-06 ENCOUNTER — Institutional Professional Consult (permissible substitution): Payer: Medicare HMO | Admitting: Diagnostic Neuroimaging

## 2018-08-06 ENCOUNTER — Encounter

## 2018-08-06 ENCOUNTER — Telehealth: Payer: Self-pay | Admitting: *Deleted

## 2018-08-06 NOTE — Telephone Encounter (Signed)
Patient no-showed today's appointment for new problem: frequent headaches.

## 2018-08-08 ENCOUNTER — Other Ambulatory Visit: Payer: Self-pay | Admitting: Cardiology

## 2018-08-14 ENCOUNTER — Other Ambulatory Visit: Payer: Self-pay | Admitting: Chiropractor

## 2018-08-14 DIAGNOSIS — M5384 Other specified dorsopathies, thoracic region: Secondary | ICD-10-CM

## 2018-08-14 DIAGNOSIS — M9902 Segmental and somatic dysfunction of thoracic region: Secondary | ICD-10-CM

## 2018-08-28 ENCOUNTER — Ambulatory Visit
Admission: RE | Admit: 2018-08-28 | Discharge: 2018-08-28 | Disposition: A | Discharge status: Home / Self Care | Payer: Worker's Compensation | Source: Ambulatory Visit | Attending: Chiropractor | Admitting: Chiropractor

## 2018-08-28 DIAGNOSIS — M9902 Segmental and somatic dysfunction of thoracic region: Secondary | ICD-10-CM

## 2018-08-28 DIAGNOSIS — M5384 Other specified dorsopathies, thoracic region: Secondary | ICD-10-CM

## 2018-09-10 ENCOUNTER — Other Ambulatory Visit: Payer: Medicare HMO

## 2018-09-10 DIAGNOSIS — E7801 Familial hypercholesterolemia: Secondary | ICD-10-CM

## 2018-09-10 LAB — HEPATIC FUNCTION PANEL
ALBUMIN: 4.2 g/dL (ref 3.6–4.8)
ALT: 26 IU/L (ref 0–32)
AST: 18 IU/L (ref 0–40)
Alkaline Phosphatase: 70 IU/L (ref 39–117)
BILIRUBIN TOTAL: 0.5 mg/dL (ref 0.0–1.2)
Bilirubin, Direct: 0.13 mg/dL (ref 0.00–0.40)
TOTAL PROTEIN: 6.4 g/dL (ref 6.0–8.5)

## 2018-09-10 LAB — LIPID PANEL
Chol/HDL Ratio: 4.1 ratio (ref 0.0–4.4)
Cholesterol, Total: 224 mg/dL — ABNORMAL HIGH (ref 100–199)
HDL: 55 mg/dL (ref >39)
LDL Calculated: 140 mg/dL — ABNORMAL HIGH (ref 0–99)
Triglycerides: 143 mg/dL (ref 0–149)
VLDL CHOLESTEROL CAL: 29 mg/dL (ref 5–40)

## 2018-09-13 ENCOUNTER — Other Ambulatory Visit: Payer: Self-pay | Admitting: *Deleted

## 2018-09-13 MED ORDER — PRAVASTATIN SODIUM 80 MG PO TABS: 80.0000 mg | ORAL_TABLET | Freq: Every evening | ORAL | 3 refills | Status: DC

## 2018-09-13 NOTE — Progress Notes (Signed)
Pt to increase Pravastatin to 80 mg daily.

## 2018-09-20 DIAGNOSIS — R51 Headache: Secondary | ICD-10-CM | POA: Diagnosis not present

## 2018-09-21 ENCOUNTER — Encounter: Payer: Self-pay | Admitting: *Deleted

## 2018-09-21 ENCOUNTER — Ambulatory Visit: Payer: Medicare HMO | Admitting: Cardiology

## 2018-09-21 ENCOUNTER — Encounter: Payer: Self-pay | Admitting: Cardiology

## 2018-09-21 VITALS — BP 124/62 | HR 72 | Ht 64.0 in | Wt 153.1 lb

## 2018-09-21 DIAGNOSIS — Z01812 Encounter for preprocedural laboratory examination: Secondary | ICD-10-CM | POA: Diagnosis not present

## 2018-09-21 DIAGNOSIS — R002 Palpitations: Secondary | ICD-10-CM | POA: Diagnosis not present

## 2018-09-21 DIAGNOSIS — I209 Angina pectoris, unspecified: Secondary | ICD-10-CM

## 2018-09-21 DIAGNOSIS — E7801 Familial hypercholesterolemia: Secondary | ICD-10-CM | POA: Diagnosis not present

## 2018-09-21 DIAGNOSIS — R931 Abnormal findings on diagnostic imaging of heart and coronary circulation: Secondary | ICD-10-CM | POA: Diagnosis not present

## 2018-09-21 DIAGNOSIS — M79606 Pain in leg, unspecified: Secondary | ICD-10-CM

## 2018-09-21 DIAGNOSIS — R079 Chest pain, unspecified: Secondary | ICD-10-CM | POA: Diagnosis not present

## 2018-09-21 MED ORDER — METOPROLOL TARTRATE 100 MG PO TABS: 100.0000 mg | ORAL_TABLET | Freq: Once | ORAL | 0 refills | Status: DC

## 2018-09-21 NOTE — Patient Instructions (Signed)
Medication Instructions:  The current medical regimen is effective;  continue present plan and medications.  If you need a refill on your cardiac medications before your next appointment, please call your pharmacy.   Lab work: You will need blood work before your Coronary CT scan. (BMP) If you have labs (blood work) drawn today and your tests are completely normal, you will receive your results only by: Marland Kitchen MyChart Message (if you have MyChart) OR . A paper copy in the mail If you have any lab test that is abnormal or we need to change your treatment, we will call you to review the results.  Testing/Procedures: Your physician has requested that you have cardiac CT. Cardiac computed tomography (CT) is a painless test that uses an x-ray machine to take clear, detailed pictures of your heart.  Please follow instruction sheet as given.  Your physician has requested that you have a lower extremity arterial exercise duplex. During this test, exercise and ultrasound are used to evaluate arterial blood flow in the legs. Allow one hour for this exam. There are no restrictions or special instructions.  Follow-Up: At [facility], you and your health needs are our priority.  As part of our continuing mission to provide you with exceptional heart care, we have created designated Provider Care Teams.  These Care Teams include your primary Cardiologist (physician) and Advanced Practice Providers (APPs -  Physician Assistants and Nurse Practitioners) who all work together to provide you with the care you need, when you need it. You will need a follow up appointment in 12 months.  Please call our office 2 months in advance to schedule this appointment.  You may see Candee Furbish, MD or one of the following Advanced Practice Providers on your designated Care Team:   Truitt Merle, NP Cecilie Kicks, NP . Kathyrn Drown, NP  Thank you for choosing [facility]!!

## 2018-09-21 NOTE — Progress Notes (Signed)
Letter for patient

## 2018-09-21 NOTE — Progress Notes (Signed)
Cardiology Office Note:    Date:  09/21/2018   ID:  Tracey Henson, DOB 21-Mar-1951, MRN [id number]  PCP:  Donald Prose, MD  Cardiologist:  Candee Furbish, MD  Electrophysiologist:  None   Referring MD: Donald Prose, MD     History of Present Illness:    Tracey Henson is a 68 y.o. female with multiple statin intolerances currently seen by lipid clinic and recently placed on pravastatin 40 mg once a day on 07/12/2018 here for follow-up.  Has a strong family history of coronary artery disease father 39 siblings with CAD.  Nuclear stress test 2014 was low risk but did have upsloping ST segment depression.  CVD 10-year risk score is 8.7% falls in the statin benefit group.  She also had coronary calcium on CT with mild LAD calcification as well as aortic atherosclerosis.  Prior labs on 06/27/2017 showed LDL of 85.  Prior EKG from 01/31/2018 shows nonspecific ST-T wave changes, subtle upsloping noted in the lateral leads.  She states today that she has been waking up sometimes her heart pounding, increasing fatigue.  She also complained of some chest discomfort, jaw pain when chest is hurting as well.  Today she is chest pain-free.  Leg pain. Walking.  She is also waking up at night with leg cramping.  Mostly in her calves.  She does not like to walk the mall anymore because of leg pain.  Past Medical History:  Diagnosis Date  . Asthma    seasonal  . Baker cyst    left leg  . Baker's cyst, left    left leg  . Breast cyst   . Frequent headaches   . GERD (gastroesophageal reflux disease)   . Hearing loss    bilateral  . Hypercholesteremia   . Muscle cramps   . Vertigo     Past Surgical History:  Procedure Laterality Date  . ABDOMINAL HYSTERECTOMY  1987  . APPENDECTOMY  1963  . BILATERAL OOPHORECTOMY    . BREAST CYST EXCISION Bilateral pt unsure  . BREAST SURGERY  1990,1991   lumpectomy, 3 x R breats, 2 x Left breast  . SEPTOPLASTY    . TEMPOROMANDIBULAR JOINT SURGERY  1998     Current Medications: Current Meds  Medication Sig  . cycloSPORINE (RESTASIS) 0.05 % ophthalmic emulsion Place 1 drop into both eyes 2 (two) times daily.  Marland Kitchen loratadine (CLARITIN) 10 MG tablet Take 10 mg by mouth daily.  Marland Kitchen omeprazole (PRILOSEC) 20 MG capsule Take 20 mg by mouth as needed.  . pravastatin (PRAVACHOL) 80 MG tablet Take 1 tablet (80 mg total) by mouth every evening.  . valACYclovir (VALTREX) 500 MG tablet Take 500 mg by mouth daily as needed.     Allergies:   Cefaclor; Codeine; Crestor [rosuvastatin]; and Oxybutynin   Social History   Socioeconomic History  . Marital status: Single    Spouse name: Not on file  . Number of children: 2  . Years of education: assoc degree  . Highest education level: Associate degree: academic program  Occupational History    Comment: [employer]  Social Needs  . Financial resource strain: Not on file  . Food insecurity:    Worry: Not on file    Inability: Not on file  . Transportation needs:    Medical: Not on file    Non-medical: Not on file  Tobacco Use  . Smoking status: Never Smoker  . Smokeless tobacco: Never Used  Substance and Sexual Activity  .  Alcohol use: No    Alcohol/week: 0.0 standard drinks    Comment: Occassional wine  . Drug use: No  . Sexual activity: Not on file  Lifestyle  . Physical activity:    Days per week: Not on file    Minutes per session: Not on file  . Stress: Not on file  Relationships  . Social connections:    Talks on phone: Not on file    Gets together: Not on file    Attends religious service: Not on file    Active member of club or organization: Not on file    Attends meetings of clubs or organizations: Not on file    Relationship status: Not on file  Other Topics Concern  . Not on file  Social History Narrative   Caffeine- none     Family History: The patient's family history includes Cancer in her maternal aunt and paternal grandmother; Coronary artery disease in her father,  maternal grandfather, and mother; Heart attack in her mother; Heart attack (age of onset: 18) in her father; Heart attack (age of onset: 21) in her maternal grandfather; Heart disease in her sister; Hypertension in her brother and sister; Lung cancer (age of onset: 46) in her mother; Stroke (age of onset: 19) in her maternal grandmother.  ROS:   Please see the history of present illness.    Nuys any fevers chills nausea vomiting syncope bleeding.  Positive for fatigue, some anxieties.  All other systems reviewed and are negative.  EKGs/Labs/Other Studies Reviewed:    The following studies were reviewed today: Prior office notes, lab work, EKG  EKG: Prior as above in HPI nonspecific ST-T wave changes  Recent Labs: 01/31/2018: BUN 10; Creatinine, Ser 0.77; Hemoglobin 13.5; Platelets 232; Potassium 3.5; Sodium 145 09/10/2018: ALT 26  Recent Lipid Panel    Component Value Date/Time   CHOL 224 (H) 09/10/2018 1040   CHOL 221 (H) 05/20/2015 0814   TRIG 143 09/10/2018 1040   TRIG 88 05/20/2015 0814   HDL 55 09/10/2018 1040   HDL 50 05/20/2015 0814   CHOLHDL 4.1 09/10/2018 1040   CHOLHDL 3.2 08/25/2015 0746   VLDL 15 08/25/2015 0746   LDLCALC 140 (H) 09/10/2018 1040   LDLCALC 153 (H) 05/20/2015 0814    Physical Exam:    VS:  BP 124/62   Pulse 72   Ht 5\' 4"  (1.626 m)   Wt 153 lb 1.9 oz (69.5 kg)   LMP  (LMP Unknown)   SpO2 97%   BMI 26.28 kg/m     Wt Readings from Last 3 Encounters:  09/21/18 153 lb 1.9 oz (69.5 kg)  09/07/17 156 lb 1.9 oz (70.8 kg)  09/06/17 159 lb (72.1 kg)     GEN:  Well nourished, well developed in no acute distress HEENT: Normal NECK: No JVD; No carotid bruits LYMPHATICS: No lymphadenopathy CARDIAC: RRR, no murmurs, rubs, gallops RESPIRATORY:  Clear to auscultation without rales, wheezing or rhonchi  ABDOMEN: Soft, non-tender, non-distended MUSCULOSKELETAL:  No edema; No deformity  SKIN: Warm and dry NEUROLOGIC:  Alert and oriented x  3 PSYCHIATRIC:  Normal affect, mildly anxious  ASSESSMENT:    1. Chest pain, unspecified type   2. Pain of lower extremity, unspecified laterality   3. Elevated coronary artery calcium score   4. Pre-procedure lab exam   5. Familial hypercholesterolemia   6. Palpitations   7. Angina pectoris ([place])    PLAN:    In order of problems listed above:  Hyperlipidemia with strong family history of CAD - Appreciate lipid clinic assistance.  Pravastatin 40 mg was last change after she wanted to switch off of Livalo.  This was in October 2019. -LDL 140 on 09/10/2018, HDL 55, total cholesterol 224, triglycerides 143, creatinine 0.8  Aortic atherosclerosis and coronary artery calcification - Strongly encourage statin use to help reduce cardiovascular morbidity.  LDL not as low as we would like.  Livalo certainly was more potent and worked better at lowering her LDL but she had too many side effects.  Chest discomfort with jaw pain - Anginal symptoms.  With her mid LAD calcification, we will go ahead and check a CT scan with FFR analysis of her coronary arteries.  She is quite anxious about the symptoms.  All 13 of her father's siblings had CAD.  Lower extremity claudication bilaterally with left toe numbness periodically - I do believe that I am palpating her dorsalis pedis bilaterally however we will go ahead and check lower extremity ABI/ultrasound arterial.  Bilaterally.  In regards to leg cramping, encouraged her to try a magnesium supplementation for her cramps that are occurring at night.  We will follow-up with studies, otherwise 1 year.  Medication Adjustments/Labs and Tests Ordered: Current medicines are reviewed at length with the patient today.  Concerns regarding medicines are outlined above.  Orders Placed This Encounter  Procedures  . CT CORONARY MORPH W/CTA COR W/SCORE W/CA W/CM &/OR WO/CM  . CT CORONARY FRACTIONAL FLOW RESERVE DATA PREP  . CT CORONARY FRACTIONAL FLOW RESERVE  FLUID ANALYSIS  . Basic metabolic panel   Meds ordered this encounter  Medications  . metoprolol tartrate (LOPRESSOR) 100 MG tablet    Sig: Take 1 tablet (100 mg total) by mouth once for 1 dose. Take one tablet 2 hours before your Coronary CT    Dispense:  1 tablet    Refill:  0    Patient Instructions  Medication Instructions:  The current medical regimen is effective;  continue present plan and medications.  If you need a refill on your cardiac medications before your next appointment, please call your pharmacy.   Lab work: You will need blood work before your Coronary CT scan. (BMP) If you have labs (blood work) drawn today and your tests are completely normal, you will receive your results only by: Marland Kitchen MyChart Message (if you have MyChart) OR . A paper copy in the mail If you have any lab test that is abnormal or we need to change your treatment, we will call you to review the results.  Testing/Procedures: Your physician has requested that you have cardiac CT. Cardiac computed tomography (CT) is a painless test that uses an x-ray machine to take clear, detailed pictures of your heart.  Please follow instruction sheet as given.  Your physician has requested that you have a lower extremity arterial exercise duplex. During this test, exercise and ultrasound are used to evaluate arterial blood flow in the legs. Allow one hour for this exam. There are no restrictions or special instructions.  Follow-Up: At [facility], you and your health needs are our priority.  As part of our continuing mission to provide you with exceptional heart care, we have created designated Provider Care Teams.  These Care Teams include your primary Cardiologist (physician) and Advanced Practice Providers (APPs -  Physician Assistants and Nurse Practitioners) who all work together to provide you with the care you need, when you need it. You will need a follow up appointment in  12 months.  Please call our  office 2 months in advance to schedule this appointment.  You may see Candee Furbish, MD or one of the following Advanced Practice Providers on your designated Care Team:   Truitt Merle, NP Cecilie Kicks, NP . Kathyrn Drown, NP  Thank you for choosing [facility]!!         Signed, Candee Furbish, MD  09/21/2018 8:56 AM    [place]

## 2018-09-24 ENCOUNTER — Other Ambulatory Visit: Payer: Self-pay | Admitting: Cardiology

## 2018-09-24 DIAGNOSIS — M79606 Pain in leg, unspecified: Secondary | ICD-10-CM

## 2018-09-25 ENCOUNTER — Telehealth: Payer: Self-pay | Admitting: Cardiology

## 2018-09-25 NOTE — Telephone Encounter (Signed)
New message:  Patient calling she is having some concerns about a appt she has. Patient states she is going out of town and have some question. Please call patient.

## 2018-09-25 NOTE — Telephone Encounter (Signed)
Reviewed instructions re: scheduling of [facility] (10/08/2018), lab and Coronary CT to be scheduled after pre-cert has been obtained.  Pt thanked me for answering her questions. She will c/b if further questions or concerns.

## 2018-10-01 ENCOUNTER — Other Ambulatory Visit: Payer: Medicare HMO

## 2018-10-03 ENCOUNTER — Other Ambulatory Visit: Payer: Self-pay

## 2018-10-05 ENCOUNTER — Other Ambulatory Visit: Payer: Self-pay

## 2018-10-08 ENCOUNTER — Inpatient Hospital Stay (HOSPITAL_COMMUNITY): Admission: RE | Admit: 2018-10-08 | Payer: Medicare HMO | Source: Ambulatory Visit

## 2018-10-09 ENCOUNTER — Telehealth (HOSPITAL_COMMUNITY): Payer: Self-pay | Admitting: Emergency Medicine

## 2018-10-09 ENCOUNTER — Other Ambulatory Visit: Payer: Medicare HMO

## 2018-10-09 DIAGNOSIS — R079 Chest pain, unspecified: Secondary | ICD-10-CM | POA: Diagnosis not present

## 2018-10-09 DIAGNOSIS — Z01812 Encounter for preprocedural laboratory examination: Secondary | ICD-10-CM | POA: Diagnosis not present

## 2018-10-09 DIAGNOSIS — R931 Abnormal findings on diagnostic imaging of heart and coronary circulation: Secondary | ICD-10-CM

## 2018-10-09 LAB — BASIC METABOLIC PANEL
BUN/Creatinine Ratio: 16 (ref 12–28)
BUN: 13 mg/dL (ref 8–27)
CALCIUM: 9.7 mg/dL (ref 8.7–10.3)
CHLORIDE: 108 mmol/L — AB (ref 96–106)
CO2: 27 mmol/L (ref 20–29)
Creatinine, Ser: 0.79 mg/dL (ref 0.57–1.00)
GFR calc Af Amer: 89 mL/min/{1.73_m2} (ref >59)
GFR calc non Af Amer: 77 mL/min/{1.73_m2} (ref >59)
Glucose: 85 mg/dL (ref 65–99)
Potassium: 4.2 mmol/L (ref 3.5–5.2)
Sodium: 142 mmol/L (ref 134–144)

## 2018-10-09 NOTE — Telephone Encounter (Signed)
Left message on voicemail with name and callback number Dondra Rhett RN Navigator Cardiac Imaging [facility] [phone] Office [phone] Cell  

## 2018-10-10 ENCOUNTER — Ambulatory Visit (HOSPITAL_COMMUNITY): Admission: RE | Admit: 2018-10-10 | Payer: Medicare HMO | Source: Ambulatory Visit

## 2018-10-10 ENCOUNTER — Ambulatory Visit (HOSPITAL_COMMUNITY)
Admission: RE | Admit: 2018-10-10 | Discharge: 2018-10-10 | Disposition: A | Discharge status: Home / Self Care | Payer: Medicare HMO | Source: Ambulatory Visit | Attending: Cardiology | Admitting: Cardiology

## 2018-10-10 DIAGNOSIS — R931 Abnormal findings on diagnostic imaging of heart and coronary circulation: Secondary | ICD-10-CM

## 2018-10-10 DIAGNOSIS — I209 Angina pectoris, unspecified: Secondary | ICD-10-CM | POA: Diagnosis not present

## 2018-10-10 DIAGNOSIS — R079 Chest pain, unspecified: Secondary | ICD-10-CM

## 2018-10-10 MED ORDER — IOPAMIDOL (ISOVUE-370) INJECTION 76%
100.0000 mL | Freq: Once | INTRAVENOUS | Status: AC | PRN
  Administered 2018-10-10: 100 mL via INTRAVENOUS

## 2018-10-10 MED ORDER — NITROGLYCERIN 0.4 MG SL SUBL
SUBLINGUAL_TABLET | SUBLINGUAL | Status: AC
  Administered 2018-10-10: 0.8 mg
  Filled 2018-10-10: qty 2

## 2018-10-11 ENCOUNTER — Ambulatory Visit (HOSPITAL_COMMUNITY)
Admission: RE | Admit: 2018-10-11 | Discharge: 2018-10-11 | Disposition: A | Discharge status: Home / Self Care | Payer: Medicare HMO | Source: Ambulatory Visit | Attending: Cardiovascular Disease | Admitting: Cardiovascular Disease

## 2018-10-11 ENCOUNTER — Ambulatory Visit
Admission: RE | Admit: 2018-10-11 | Discharge: 2018-10-11 | Disposition: A | Discharge status: Home / Self Care | Payer: Medicare HMO | Source: Ambulatory Visit | Attending: Family Medicine | Admitting: Family Medicine

## 2018-10-11 ENCOUNTER — Telehealth: Payer: Self-pay | Admitting: Nurse Practitioner

## 2018-10-11 DIAGNOSIS — N6011 Diffuse cystic mastopathy of right breast: Secondary | ICD-10-CM | POA: Diagnosis not present

## 2018-10-11 DIAGNOSIS — N6310 Unspecified lump in the right breast, unspecified quadrant: Secondary | ICD-10-CM

## 2018-10-11 DIAGNOSIS — R590 Localized enlarged lymph nodes: Secondary | ICD-10-CM

## 2018-10-11 DIAGNOSIS — R928 Other abnormal and inconclusive findings on diagnostic imaging of breast: Secondary | ICD-10-CM | POA: Diagnosis not present

## 2018-10-11 DIAGNOSIS — M79606 Pain in leg, unspecified: Secondary | ICD-10-CM | POA: Diagnosis not present

## 2018-10-11 NOTE — Telephone Encounter (Signed)
-----   Message from Jerline Pain, MD sent at 10/11/2018  9:11 AM EST ----- Mild plaque noted in all three vessels (LAD, RCA, LCX) Continue to work on statin use (has intolerance) No flow limiting lesions.  There was a right sided hilar lymph node that was 1.7cm.  Radiologist recommended a contrast enhanced chest CT in 3 months to ensure resolution of this finding. Please order this study.  Thanks Candee Furbish, MD

## 2018-10-11 NOTE — Telephone Encounter (Signed)
Reviewed results and plan of care with patient who verbalized understanding and agreement. She states she is currently taking Pravachol 80 mg and will call to report if she is unable to tolerate this dose. Chest CT with contrast ordered for 3 months and patient agrees with getting that repeat test. She asks if this lymph node could have anything to do with right shoulder pain that she has been having. I advised I will send message back to Dr. Marlou Porch with her question. She verbalized understanding and thanked me for the call.

## 2018-10-12 ENCOUNTER — Ambulatory Visit: Payer: Medicare HMO | Admitting: Diagnostic Neuroimaging

## 2018-10-12 ENCOUNTER — Encounter: Payer: Self-pay | Admitting: Diagnostic Neuroimaging

## 2018-10-12 ENCOUNTER — Telehealth: Payer: Self-pay | Admitting: Cardiology

## 2018-10-12 VITALS — BP 127/68 | HR 70 | Ht 64.0 in | Wt 153.4 lb

## 2018-10-12 DIAGNOSIS — Z1389 Encounter for screening for other disorder: Secondary | ICD-10-CM | POA: Diagnosis not present

## 2018-10-12 DIAGNOSIS — R200 Anesthesia of skin: Secondary | ICD-10-CM | POA: Diagnosis not present

## 2018-10-12 DIAGNOSIS — R799 Abnormal finding of blood chemistry, unspecified: Secondary | ICD-10-CM | POA: Diagnosis not present

## 2018-10-12 DIAGNOSIS — G4489 Other headache syndrome: Secondary | ICD-10-CM | POA: Diagnosis not present

## 2018-10-12 DIAGNOSIS — R209 Unspecified disturbances of skin sensation: Secondary | ICD-10-CM | POA: Diagnosis not present

## 2018-10-12 DIAGNOSIS — E7800 Pure hypercholesterolemia, unspecified: Secondary | ICD-10-CM | POA: Diagnosis not present

## 2018-10-12 NOTE — Progress Notes (Signed)
[facility]  PATIENT: Tracey Henson DOB: June 13, 1951  REFERRING CLINICIAN: Swayne HISTORY FROM: patient  REASON FOR VISIT: new consult    HISTORICAL  CHIEF COMPLAINT:  Chief Complaint  Patient presents with  . New Consult    Referred by Dr. Moreen Fowler  . Frequent Headaches    Rm 6, alone.  Starts with peripheral vision change, then get headache (always L side).  Nausea. Every other dayl.  Takes motrin after vision returns normal,for the headache.  Dr. Scharlene Gloss at [facility]:   NEW HPI (10/12/18): She presents today with concerns of vision changes and headaches for the past 6 months. She feels that her vision is like a kaleidoscope and seconds later she will have a headache. Vision changes are always bilateral. She sees shapes, color (usually silver) and lights. Headache is described as a really bad throbbing sensation. She is sensitive to light, unsure of sound. She is nauseated with headache. Relieved by rest and dark room. Unable to identify triggers. Headaches are always during the day. Outside of these episodes she rarely has a sinus headache. She drinks wine on occasion but has not noticed that this stimulates headache. Occurences are at least 3 times a week. She has not had any headaches in the past two weeks. She usually takes one tablet of ibuprofen with each headache. She is unsure of any family history of migraines. Eye exam in summer of 2019 was reportedly normal.  She does not sleep well. She usually tries to go to bed around 10pm. It takes at least an hour to fall asleep. She is up and down throughout the night. She states that her mind starts thinking about things she needs to do. She denies concerns of anxiety or depression. She does not snore. She occasionally has dry mouth when waking. She reports intermittent daytime sleepiness. She has never had a sleep study.  She continues to have numbness and tingling of her left  3rd and 4th toes. It is "much worse" over the past year. Electrical sensation comes and goes daily. She is not using any treatments. She has not had an NCS/EMG.    PRIOR HPI (09/06/17): 68 year old female here for evaluation of numbness and tingling.  Summer 2018 patient woke up, felt normal, but when she sat up at the side of the bed and placed pressure on her feet she felt numbness and tingling in her left toes 2-4.  After some time symptoms would resolve.  This continued for several months.  Over time this is become more constant.  She feels more sensation especially if she is standing up for a long time.  No symptoms above her ankle.  No symptoms in her right foot.  In the last few months she has had some intermittent numbness and tingling sensation, electrical sensation, sometimes affecting her nose, sometimes in her back of her neck.  Symptoms she feels like something is falling on her skin and she rubs her nose or scratches her neck.  This is intermittent and random.  Patient also has some stiffness in her neck with "popping and cracking" sound when she turns her head neck.  No recent traumas injuries or accidents.  No recent infections.  Patient has chronic insomnia, with overactivity of thoughts and mind.  She has had more fatigue lately.  More constitutional symptoms.   REVIEW OF SYSTEMS: Full 14 system review of systems performed and negative with exception of: Weight gain  fatigue blurred vision easy bruising memory loss headache numbness slurred speech dizziness not enough sleep decreased energy skin sensitivity incontinence itching ringing in ears hearing loss shortness of breath palpitations.  ALLERGIES: Allergies  Allergen Reactions  . Cefaclor Hives  . Codeine     Slows heart rate right down  . Crestor [Rosuvastatin] Nausea And Vomiting  . Oxybutynin Other (See Comments)    Tongue burning    HOME MEDICATIONS: Outpatient Medications Prior to Visit  Medication Sig  Dispense Refill  . cyanocobalamin 1000 MCG tablet Take 1,000 mcg by mouth daily.    . cycloSPORINE (RESTASIS) 0.05 % ophthalmic emulsion Place 1 drop into both eyes 2 (two) times daily.    Marland Kitchen loratadine (CLARITIN) 10 MG tablet Take 10 mg by mouth daily.    Marland Kitchen omeprazole (PRILOSEC) 20 MG capsule Take 20 mg by mouth daily.     . pravastatin (PRAVACHOL) 80 MG tablet Take 1 tablet (80 mg total) by mouth every evening. 30 tablet 3  . valACYclovir (VALTREX) 500 MG tablet Take 500 mg by mouth every other day.   3  . metoprolol tartrate (LOPRESSOR) 100 MG tablet Take 1 tablet (100 mg total) by mouth once for 1 dose. Take one tablet 2 hours before your Coronary CT 1 tablet 0   No facility-administered medications prior to visit.     PAST MEDICAL HISTORY: Past Medical History:  Diagnosis Date  . Asthma    seasonal  . Baker cyst    left leg  . Baker's cyst, left    left leg  . Breast cyst   . Frequent headaches   . GERD (gastroesophageal reflux disease)   . Hearing loss    bilateral  . Hypercholesteremia   . Muscle cramps   . Vertigo     PAST SURGICAL HISTORY: Past Surgical History:  Procedure Laterality Date  . ABDOMINAL HYSTERECTOMY  1987  . APPENDECTOMY  1963  . BILATERAL OOPHORECTOMY    . BREAST CYST EXCISION Bilateral pt unsure  . BREAST EXCISIONAL BIOPSY Right    x1  . BREAST EXCISIONAL BIOPSY Left    x2  . BREAST SURGERY  1990,1991   lumpectomy, 3 x R breats, 2 x Left breast  . SEPTOPLASTY    . TEMPOROMANDIBULAR JOINT SURGERY  1998    FAMILY HISTORY: Family History  Problem Relation Age of Onset  . Lung cancer Mother 29       smoked  . Heart attack Mother   . Coronary artery disease Mother   . Heart disease Sister   . Hypertension Sister   . Stroke Maternal Grandmother 62  . Heart attack Maternal Grandfather 68  . Coronary artery disease Maternal Grandfather   . Heart attack Father 14  . Coronary artery disease Father   . Hypertension Brother   . Cancer  Paternal Grandmother   . Cancer Maternal Aunt        breast    SOCIAL HISTORY:  Social History   Socioeconomic History  . Marital status: Single    Spouse name: Not on file  . Number of children: 2  . Years of education: assoc degree  . Highest education level: Associate degree: academic program  Occupational History    Comment: [employer]  Social Needs  . Financial resource strain: Not on file  . Food insecurity:    Worry: Not on file    Inability: Not on file  . Transportation needs:    Medical: Not on file  Non-medical: Not on file  Tobacco Use  . Smoking status: Never Smoker  . Smokeless tobacco: Never Used  Substance and Sexual Activity  . Alcohol use: No    Alcohol/week: 0.0 standard drinks    Comment: Occassional wine  . Drug use: Never  . Sexual activity: Not on file  Lifestyle  . Physical activity:    Days per week: Not on file    Minutes per session: Not on file  . Stress: Not on file  Relationships  . Social connections:    Talks on phone: Not on file    Gets together: Not on file    Attends religious service: Not on file    Active member of club or organization: Not on file    Attends meetings of clubs or organizations: Not on file    Relationship status: Not on file  . Intimate partner violence:    Fear of current or ex partner: Not on file    Emotionally abused: Not on file    Physically abused: Not on file    Forced sexual activity: Not on file  Other Topics Concern  . Not on file  Social History Narrative   Caffeine- none. Lives home alone.  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  3 children.  Caffein 2-3 cups daily (sometimes)     PHYSICAL EXAM  GENERAL EXAM/CONSTITUTIONAL: Vitals:  Vitals:   10/12/18 1106  BP: 127/68  Pulse: 70  Weight: 153 lb 6.4 oz (69.6 kg)  Height: 5\' 4"  (1.626 m)   Body mass index is 26.33 kg/m. No exam data present  Patient is in no distress; well developed, nourished  and groomed; neck is supple  CARDIOVASCULAR:  Examination of carotid arteries is normal; no carotid bruits  Regular rate and rhythm, no murmurs  Examination of peripheral vascular system by observation and palpation is normal  EYES:  Ophthalmoscopic exam of optic discs and posterior segments is normal; no papilledema or hemorrhages  MUSCULOSKELETAL:  Gait, strength, tone, movements noted in Neurologic exam below  NEUROLOGIC: MENTAL STATUS:  No flowsheet data found.  awake, alert, oriented to person, place and time  recent and remote memory intact  normal attention and concentration  language fluent, comprehension intact, naming intact,   fund of knowledge appropriate  CRANIAL NERVE:  2nd - no papilledema on fundoscopic exam  2nd, 3rd, 4th, 6th - pupils equal and reactive to light, visual fields full to confrontation, extraocular muscles intact, no nystagmus  5th - facial sensation symmetric  7th - facial strength symmetric  8th - hearing intact  9th - palate elevates symmetrically, uvula midline  11th - shoulder shrug symmetric  12th - tongue protrusion midline  MOTOR:   normal bulk and tone, full strength in the BUE, BLE  SENSORY:   normal and symmetric to light touch, temperature, vibration, pinprick; EXCEPT SLIGHT DECR VIB IN 2nd, 3rd, and 4th toes BILATERALLY, DECR PINPRICK IN RT 3rd TOE  COORDINATION:   finger-nose-finger, fine finger movements normal  REFLEXES:   deep tendon reflexes present and symmetric  GAIT/STATION:   narrow based gait; able to walk on toes, heels and tandem; romberg is negative    DIAGNOSTIC DATA (LABS, IMAGING, TESTING) - I reviewed patient records, labs, notes, testing and imaging myself where available.  Lab Results  Component Value Date   WBC 7.5 01/31/2018   HGB 13.5 01/31/2018   HCT 41.9 01/31/2018   MCV 86.0 01/31/2018   PLT 232 01/31/2018  Component Value Date/Time   NA 142 10/09/2018 1203    K 4.2 10/09/2018 1203   CL 108 (H) 10/09/2018 1203   CO2 27 10/09/2018 1203   GLUCOSE 85 10/09/2018 1203   GLUCOSE 87 01/31/2018 1313   BUN 13 10/09/2018 1203   CREATININE 0.79 10/09/2018 1203   CALCIUM 9.7 10/09/2018 1203   PROT 6.4 09/10/2018 1040   ALBUMIN 4.2 09/10/2018 1040   AST 18 09/10/2018 1040   ALT 26 09/10/2018 1040   ALKPHOS 70 09/10/2018 1040   BILITOT 0.5 09/10/2018 1040   GFRNONAA 77 10/09/2018 1203   GFRAA 89 10/09/2018 1203   Lab Results  Component Value Date   CHOL 224 (H) 09/10/2018   HDL 55 09/10/2018   LDLCALC 140 (H) 09/10/2018   TRIG 143 09/10/2018   CHOLHDL 4.1 09/10/2018   No results found for: HGBA1C No results found for: VITAMINB12 No results found for: TSH      ASSESSMENT AND PLAN  68 y.o. year old female here with constellation of symptoms, primarily sensory disturbance in her left toes, nose and back of head and neck.  Now with new onset of headaches with migraine features.    Dx: neuropathy, polyradiculopathy, CNS autoimmune / inflamm, migraine, stress reaction  1. Numbness   2. Other headache syndrome      PLAN:   NEW ONSET HEADACHES - MRI brain - then may consider migraine tx (may consider amitriptyline, topiramate, triptans)  NUMBNESS / TINGLING - MRI brain, cervical - EMG/NCS - labs - may consider gabapentin (or amitriptyline)  INSOMNIA - sleep hygiene reviewed - consider sleep psychology evaluation for anxiety  Orders Placed This Encounter  Procedures  . MR BRAIN W WO CONTRAST  . MR CERVICAL SPINE W WO CONTRAST  . Vitamin B12  . TSH  . Hemoglobin A1c  . ANA,IFA RA Diag Pnl w/rflx Tit/Patn  . NCV with EMG(electromyography)   Return in about 3 months (around 01/11/2019).    Penni Bombard, MD 2/40/9735, 32:99 AM Certified in Neurology, Neurophysiology and Neuroimaging  [facility] [address] [place], [place] [address] [phone]

## 2018-10-12 NOTE — Telephone Encounter (Signed)
-----   Message from Nuala Alpha, LPN sent at 9/73/3125  1:42 PM EST -----  ----- Message ----- From: Jerline Pain, MD Sent: 10/11/2018   1:36 PM EST To: Shellia Cleverly, RN, Cv Div [facility] Triage  Normal ABI's. Excellent Candee Furbish, MD

## 2018-10-12 NOTE — Telephone Encounter (Signed)
Notes recorded by Emmaline Life, RN on 10/11/2018 at 1:17 PM EST Patient aware and agrees with plan to continue Pravachol and have repeat CT in 3 months. See telephone encounter regarding her question about pain in right shoulder. ------  Pt aware to repeat CT in April.  Testing has been ordered in [facility]

## 2018-10-12 NOTE — Telephone Encounter (Signed)
Pt aware shoulder pain not r/t enlarged lymph node.  She will f/u with PCP for further evaluation of this.  She is aware to have repeat chest CT in 3 months.

## 2018-10-12 NOTE — Telephone Encounter (Signed)
New message      Pt calling back for ct  results

## 2018-10-12 NOTE — Telephone Encounter (Signed)
Patient notified of result.  Please refer to phone note from today for complete details.   Julaine Hua, [facility] 10/12/2018 8:37 AM   Pt returned call for her ABI results. Pt had been given CT results yesterday per Christen Bame, RN. Pt states she was told yesterday in the CT results shows an enlarged lymph node and wanted to know what is the next step. I took a minute and reviewed the CT results. I saw in the Impression looks like recommendation is to repeat CT in 3 months. I advised pt that I will send this message to Dr. Marlou Porch and we will let her know of his recommendation, if any. Pt thanked me for the call .

## 2018-10-12 NOTE — Telephone Encounter (Signed)
Shoulder pain not likely associated with lymph node Candee Furbish, MD

## 2018-10-12 NOTE — Patient Instructions (Signed)
Check MRI brain, cervical spine  Check EMG/NCS (electrical nerve testing)  Then may consider migraine treatments in future

## 2018-10-15 ENCOUNTER — Telehealth: Payer: Self-pay | Admitting: Diagnostic Neuroimaging

## 2018-10-15 NOTE — Telephone Encounter (Signed)
[facility] pending faxed clinical notes  °

## 2018-10-16 LAB — ANA,IFA RA DIAG PNL W/RFLX TIT/PATN
ANA Titer 1: NEGATIVE
Cyclic Citrullin Peptide Ab: 11 units (ref 0–19)
Rheumatoid fact SerPl-aCnc: <10 IU/mL (ref 0.0–13.9)

## 2018-10-16 LAB — HEMOGLOBIN A1C
Est. average glucose Bld gHb Est-mCnc: 117 mg/dL
Hgb A1c MFr Bld: 5.7 % — ABNORMAL HIGH (ref 4.8–5.6)

## 2018-10-16 LAB — TSH: TSH: 2.16 u[IU]/mL (ref 0.450–4.500)

## 2018-10-16 LAB — VITAMIN B12: Vitamin B-12: 918 pg/mL (ref 232–1245)

## 2018-10-17 ENCOUNTER — Telehealth: Payer: Self-pay

## 2018-10-17 DIAGNOSIS — I700 Atherosclerosis of aorta: Secondary | ICD-10-CM | POA: Diagnosis not present

## 2018-10-17 DIAGNOSIS — J019 Acute sinusitis, unspecified: Secondary | ICD-10-CM | POA: Diagnosis not present

## 2018-10-17 DIAGNOSIS — N3281 Overactive bladder: Secondary | ICD-10-CM | POA: Diagnosis not present

## 2018-10-17 DIAGNOSIS — R202 Paresthesia of skin: Secondary | ICD-10-CM | POA: Diagnosis not present

## 2018-10-17 DIAGNOSIS — E785 Hyperlipidemia, unspecified: Secondary | ICD-10-CM | POA: Diagnosis not present

## 2018-10-17 NOTE — Telephone Encounter (Signed)
Tracey Henson Tracey Henson: [id number] (exp. 10/15/18 to 11/14/18) order sent to GI. lvm for pt to be aware I left GI phone number of [phone] and to give them a call if she has not heard from them in the next 2-3 business days.

## 2018-10-17 NOTE — Telephone Encounter (Signed)
-----   Message from Penni Bombard, MD sent at 10/16/2018  3:48 PM EST ----- Unremarkable labs. Continue current plan. Please call patient. -VRP

## 2018-10-17 NOTE — Telephone Encounter (Signed)
I called pt and advised her of her lab results. Pt will continue with NCV/EMG. Pt verbalized understanding of results. Pt had no questions at this time but was encouraged to call back if questions arise.

## 2018-10-22 DIAGNOSIS — J329 Chronic sinusitis, unspecified: Secondary | ICD-10-CM | POA: Diagnosis not present

## 2018-10-26 ENCOUNTER — Ambulatory Visit
Admission: RE | Admit: 2018-10-26 | Discharge: 2018-10-26 | Disposition: A | Discharge status: Home / Self Care | Payer: Medicare HMO | Source: Ambulatory Visit | Attending: Diagnostic Neuroimaging | Admitting: Diagnostic Neuroimaging

## 2018-10-26 DIAGNOSIS — G4489 Other headache syndrome: Secondary | ICD-10-CM

## 2018-10-26 DIAGNOSIS — R200 Anesthesia of skin: Secondary | ICD-10-CM

## 2018-10-26 DIAGNOSIS — R202 Paresthesia of skin: Secondary | ICD-10-CM | POA: Diagnosis not present

## 2018-10-26 MED ORDER — GADOBENATE DIMEGLUMINE 529 MG/ML IV SOLN
14.0000 mL | Freq: Once | INTRAVENOUS | Status: AC | PRN
  Administered 2018-10-26: 14 mL via INTRAVENOUS

## 2018-10-29 ENCOUNTER — Telehealth: Payer: Self-pay | Admitting: *Deleted

## 2018-10-29 NOTE — Telephone Encounter (Signed)
Spoke with patient and informed her that her MRI brain result is normal. 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 Advised there is no definite cord compression or pinched nerves. Dr Raynelle Jan recommends conservative management for now. If her symptoms worsen, then she may consider a spine surgery evaluation / consult.  She asked if she needed NCS; I advised she does since MRI brain and cervical spine showed no clear reason for her symptoms., she confirmed NCS appt,  verbalized understanding, appreciation of call.

## 2018-10-31 ENCOUNTER — Other Ambulatory Visit: Payer: Self-pay | Admitting: *Deleted

## 2018-10-31 DIAGNOSIS — R590 Localized enlarged lymph nodes: Secondary | ICD-10-CM

## 2018-10-31 DIAGNOSIS — Z01812 Encounter for preprocedural laboratory examination: Secondary | ICD-10-CM

## 2018-11-08 DIAGNOSIS — R05 Cough: Secondary | ICD-10-CM | POA: Diagnosis not present

## 2018-11-08 DIAGNOSIS — R590 Localized enlarged lymph nodes: Secondary | ICD-10-CM | POA: Diagnosis not present

## 2018-11-08 DIAGNOSIS — R918 Other nonspecific abnormal finding of lung field: Secondary | ICD-10-CM | POA: Diagnosis not present

## 2018-11-15 ENCOUNTER — Encounter: Payer: Medicare HMO | Admitting: Diagnostic Neuroimaging

## 2018-11-15 ENCOUNTER — Ambulatory Visit (INDEPENDENT_AMBULATORY_CARE_PROVIDER_SITE_OTHER): Payer: Medicare HMO | Admitting: Diagnostic Neuroimaging

## 2018-11-15 DIAGNOSIS — Z0289 Encounter for other administrative examinations: Secondary | ICD-10-CM

## 2018-11-15 DIAGNOSIS — R200 Anesthesia of skin: Secondary | ICD-10-CM | POA: Diagnosis not present

## 2018-11-15 DIAGNOSIS — G4489 Other headache syndrome: Secondary | ICD-10-CM

## 2018-11-16 NOTE — Procedures (Signed)
[facility]  NCS (NERVE CONDUCTION STUDY) WITH EMG (ELECTROMYOGRAPHY) REPORT   STUDY DATE: 11/15/18 PATIENT NAME: Ketzaly Cardella DOB: Dec 08, 1950 MRN: [id number]  ORDERING CLINICIAN: Andrey Spearman, MD   TECHNOLOGIST: Sherre Scarlet ELECTROMYOGRAPHER: Earlean Polka. Rafi Kenneth, MD  CLINICAL INFORMATION: 68 year old female with left foot/toe numbness.  FINDINGS: NERVE CONDUCTION STUDY:  Bilateral peroneal and tibial motor responses are normal.  Bilateral tibial F wave latencies are normal.  Bilateral sural and superficial peroneal sensory sponsors normal.   NEEDLE ELECTROMYOGRAPHY:  Left vastus medialis, tibialis anterior, gastrocnemius muscles are normal.   IMPRESSION:   This is a normal study.  No electrodiagnostic evidence of large fiber neuropathy this time.    INTERPRETING PHYSICIAN:  Penni Bombard, MD Certified in Neurology, Neurophysiology and Neuroimaging  [facility] [address], [place], [place] [address] [phone]  [facility]    Nerve / Sites Muscle Latency Ref. Amplitude Ref. Rel Amp Segments Distance Velocity Ref. Area    ms ms mV mV %  cm m/s m/s mVms  L Peroneal - EDB     Ankle EDB 4.5 ?6.5 4.4 ?2.0 100 Ankle - EDB 9   13.5     Fib head EDB 10.2  4.2  95.1 Fib head - Ankle 27 47 ?44 13.2     Pop fossa EDB 12.3  4.1  97.6 Pop fossa - Fib head 10 48 ?44 12.9         Pop fossa - Ankle      R Peroneal - EDB     Ankle EDB 4.8 ?6.5 4.9 ?2.0 100 Ankle - EDB 9   16.1     Fib head EDB 10.4  4.7  95 Fib head - Ankle 27 48 ?44 16.4     Pop fossa EDB 12.4  4.6  98 Pop fossa - Fib head 10 49 ?44 16.8         Pop fossa - Ankle      L Tibial - AH     Ankle AH 3.4 ?5.8 15.7 ?4.0 100 Ankle - AH 9   26.8     Pop fossa AH 11.6  10.4  66.2 Pop fossa - Ankle 33 41 ?41 24.1  R Tibial - AH     Ankle AH 3.9 ?5.8 11.4 ?4.0 100 Ankle - AH 9   22.8     Pop fossa AH 12.3  7.6  66.2 Pop fossa - Ankle 35 41 ?41 19.3               SNC    Nerve / Sites Rec. Site Peak Lat Ref.  Amp Ref. Segments Distance    ms ms V V  cm  L Sural - Ankle (Calf)     Calf Ankle 3.7 ?4.4 12 ?6 Calf - Ankle 14  R Sural - Ankle (Calf)     Calf Ankle 3.6 ?4.4 10 ?6 Calf - Ankle 14  L Superficial peroneal - Ankle     Lat leg Ankle 3.5 ?4.4 8 ?6 Lat leg - Ankle 14  R Superficial peroneal - Ankle     Lat leg Ankle 3.8 ?4.4 8 ?6 Lat leg - Ankle 14              F  Wave    Nerve F Lat Ref.   ms ms  L Tibial - AH 44.0 ?56.0  R Tibial - AH 48.8 ?56.0  EMG full       EMG Summary Table    Spontaneous MUAP Recruitment  Muscle IA Fib PSW Fasc Other Amp Dur. Poly Pattern  L. Vastus medialis Normal None None None _______ Normal Normal Normal Normal  L. Tibialis anterior Normal None None None _______ Normal Normal Normal Normal  L. Gastrocnemius (Medial head) Normal None None None _______ Normal Normal Normal Normal

## 2018-11-20 ENCOUNTER — Encounter: Payer: Self-pay | Admitting: Internal Medicine

## 2018-11-20 ENCOUNTER — Ambulatory Visit: Payer: Medicare HMO | Admitting: Internal Medicine

## 2018-11-20 VITALS — BP 118/60 | HR 67 | Ht 64.0 in | Wt 155.0 lb

## 2018-11-20 DIAGNOSIS — R058 Other specified cough: Secondary | ICD-10-CM

## 2018-11-20 DIAGNOSIS — R05 Cough: Secondary | ICD-10-CM | POA: Diagnosis not present

## 2018-11-20 DIAGNOSIS — R918 Other nonspecific abnormal finding of lung field: Secondary | ICD-10-CM | POA: Diagnosis not present

## 2018-11-20 MED ORDER — MONTELUKAST SODIUM 10 MG PO TABS: 10.0000 mg | ORAL_TABLET | Freq: Every day | ORAL | 11 refills | Status: DC

## 2018-11-20 MED ORDER — PREDNISONE 10 MG PO TABS: ORAL_TABLET | ORAL | 0 refills | Status: DC

## 2018-11-20 MED ORDER — PANTOPRAZOLE SODIUM 40 MG PO TBEC: 40.0000 mg | DELAYED_RELEASE_TABLET | Freq: Every day | ORAL | 2 refills | Status: DC

## 2018-11-20 MED ORDER — FAMOTIDINE 20 MG PO TABS: ORAL_TABLET | ORAL | 11 refills | Status: DC

## 2018-11-20 MED ORDER — GABAPENTIN 100 MG PO CAPS: 100.0000 mg | ORAL_CAPSULE | Freq: Three times a day (TID) | ORAL | 2 refills | Status: DC

## 2018-11-20 MED ORDER — OMEPRAZOLE 20 MG PO CPDR: DELAYED_RELEASE_CAPSULE | ORAL | Status: DC

## 2018-11-20 NOTE — Patient Instructions (Addendum)
Prednisone 10 mg take  4 each am x 2 days,   2 each am x 2 days,  1 each am x 2 days and stop   Gabapentin 100 mg three times daily   Restart the singulair 10 mg daily   For drainage / throat tickle try take CHLORPHENIRAMINE  4 mg (chlortabs at [employer]) - take one every 4 hours as needed - available over the counter- may cause drowsiness so start with just a bedtime dose or two and see how you tolerate it before trying in daytime     Pantoprazole (protonix) 40 mg   Take  30-60 min before first meal of the day and prilosec 30 min before the last  and Pepcid (famotidine)  20 mg one @  bedtime until return to office - this is the best way to tell whether stomach acid is contributing to your problem.    GERD (REFLUX)  is an extremely common cause of respiratory symptoms just like yours , many times with no obvious heartburn at all.    It can be treated with medication, but also with lifestyle changes including elevation of the head of your bed (ideally with 6 -8inch blocks under the headboard of your bed),  Smoking cessation, avoidance of late meals, excessive alcohol, and avoid fatty foods, chocolate, peppermint, colas, red wine, and acidic juices such as orange juice.  NO MINT OR MENTHOL PRODUCTS SO NO COUGH DROPS  USE SUGARLESS CANDY INSTEAD (Jolley ranchers or Stover's or Life Savers) or even ice chips will also do - the key is to swallow to prevent all throat clearing. NO OIL BASED VITAMINS - use powdered substitutes.  Avoid fish oil when coughing.     Please schedule a follow up office visit in 4 weeks, sooner if needed

## 2018-11-20 NOTE — Progress Notes (Signed)
Subjective:    Patient ID: Tracey Henson, female    DOB: 12/19/1950,     MRN: [id number]    Brief patient profile:  31 yowf never smoker with h/o seasonal rhinitis  X 1980s then intermittent cough mid 90s not correlating with rhinitis maybe once or twice a week esp hs then doe progressive x 2016 and can't fast pace walking or hills due to sob > cardiac eval > CT heart > mpns > referred 06/08/2015 by Dr Nancy Fetter to pulmonary clinic.     History of Present Illness  06/08/2015 1st [facility] office visit/ Monika Chestang   Chief Complaint  Patient presents with  . Pulmonary Consult    Referred by Dr. Nancy Fetter for pulmonary nodules. Pt states that she has had DOE for "as long as I can remember"- gets out of breath pushing a lawn mower. She has had CP and cough that come and go.    onset of cough was indolent x 4 y prior to OV  assoc with sense of pnds daily and at hs but no excess mucus, then midline cp with severe coughing fits that occur once or twice a week assoc with doe and variable HB rec Try prilosec otc 20mg   Take 30-60 min before first meal of the day and Pepcid ac (famotidine) 20 mg one @  bedtime  Until return  For drainage take chlortrimeton (chlorpheniramine) 4 mg every 4 hours available over the counter (may cause drowsiness)  GERD diet  If still coughing you will need tessilon 200 mg up to 4 hours as needed    07/20/2015  f/u ov/Aram Domzalski re: uacs / mpns Chief Complaint  Patient presents with  . Follow-up    Pt states her breathing is unchanged.  She is still coughing and constantly clearing her throat.   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 Add singulair 10 mg each pm for now For drainage / throat  tickle try take CHLORPHENIRAMINE  4 mg - take one every 4 hours as needed - available over the counter- may cause drowsiness so start with just a bedtime dose or two and see how you tolerate it before trying in daytime   (could take 2 at bedtime)  Prednisone 10 mg take  4 each am x 2 days,   2 each am x 2 days,  1 each am x 2 days and stop  Stay on acid suppression until cough is gone for at least 2 weeks without the need for cough meds   08/18/2015  f/u ov/Emmelyn Schmale re: cough better on prilosec /singulair / antihistamines Chief Complaint  Patient presents with  . Follow-up    Pt states her cough has improved some. She is not clearing her throat as often. No new co's today.   breathing not an issue as long as doesn't push too hard  Noted more throat clearing when missed a dose of ppi Overall much better vs baseline  rec No change in meds x 3 months  If doing great > try pepcid 20 mg after bfast and supper  Please schedule a follow up visit in 3 months but call sooner  if needed> did not return as rec      11/20/2018  "New pt eval" Colleen Donahoe/ / does not remember whether h2 helped so now maint prilosec 1 h ac onlu 20  Chief Complaint  Patient presents with  . Pulmonary Consult    Rerferred by Dr Marlou Porch for pulmonary nodules. Pt c/o SOB and non prod cough x 7 wks. She states she gets SOB walking room to room.  She is sleeping propped up due to SOB when she lies down flat.  Dyspnea:  Sob and cough abruptly  started about the same time acute s obvious trigger/ sev rounds abx and pred s help now persistent day and noct  Cough: 24/7 dry mostly early on insp worse with  cold air /perfumes  Sleeping: can't sleep even sitting up due to cough / can't take any narcotics "my heart rate drops real low"  SABA use: none 02:  None  gen ant chest discomfort with coughing fits    No obvious day to day or daytime variability or assoc excess/ purulent sputum or mucus plugs or hemoptysis or cp or chest tightness,  subjective wheeze or overt sinus or hb symptoms.     Also denies any obvious fluctuation of symptoms with weather or environmental changes or other aggravating or alleviating factors except as outlined above   No unusual exposure hx or h/o childhood pna/ asthma or knowledge of premature birth.  Current Allergies, Complete Past Medical History, Past Surgical History, Family History, and Social History were reviewed in [employer] record.  ROS  The following are not active complaints unless bolded Hoarseness, sore throat, dysphagia, dental problems, itching, sneezing,  nasal congestion or discharge of excess mucus or purulent secretions, ear ache,   fever, chills, sweats, unintended wt loss or wt gain, classically pleuritic or exertional cp,  orthopnea pnd or arm/hand swelling  or leg swelling, presyncope, palpitations, abdominal pain, anorexia, nausea, vomiting, diarrhea  or change in bowel habits or change in bladder habits, change in stools or change in urine, dysuria, hematuria,  rash, arthralgias, visual complaints, headache, numbness, weakness or ataxia or problems with walking or coordination,  change in mood or  memory.        Current Meds  Medication Sig  . cyanocobalamin 1000 MCG tablet Take 1,000 mcg by mouth daily.  . cycloSPORINE (RESTASIS) 0.05 % ophthalmic emulsion Place 1 drop into both eyes 2 (two) times daily.  Marland Kitchen loratadine (CLARITIN) 10 MG tablet Take 10 mg by mouth daily.  Marland Kitchen omeprazole (PRILOSEC) 20 MG capsule Take 20 mg by mouth daily.   . pravastatin (PRAVACHOL) 80 MG tablet Take 1 tablet (80 mg total) by mouth every evening.           Objective:   Physical Exam   Pleasant amb wf  nad   07/20/2015         139 >  08/18/2015 142 > 11/20/2018   155    06/08/15 140 lb (63.504 kg)  05/22/15 140 lb (63.504 kg)  07/04/14 138 lb (62.596 kg)      amb wf with severe dry cough on inspiration    HEENT: nl dentition, turbinates bilaterally, and  oropharynx. Nl external ear canals without cough reflex   NECK :  without JVD/Nodes/TM/ nl carotid upstrokes bilaterally   LUNGS: no acc muscle use,  Nl contour chest which is clear to A and P bilaterally    CV:  RRR  no s3 or murmur or increase in  P2, and no edema   ABD:  soft and nontender with nl inspiratory excursion in the supine position. No bruits or organomegaly appreciated, bowel sounds nl  MS:  Nl gait/ ext warm without deformities, calf tenderness, cyanosis or clubbing No obvious joint restrictions   SKIN: warm and dry without lesions    NEURO:  alert, approp, nl sensorium with  no motor or cerebellar deficits apparent.       I personally reviewed images and agree with radiology impression as follows:   Chest CT coronary 10/10/2018 1. New right hilar lymphadenopathy measuring up to 1.7 cm in short axis. This is of uncertain etiology and significance, but follow-up contrast enhanced chest CT is recommended in 3 months to ensure the resolution of this finding and to fully evaluate the lungs. 2. New nodular area of architectural distortion measuring 9 mm in the inferior segment of the lingula. This is favored to reflect an area of post infectious or inflammatory scarring, but close attention at time of repeat chest CT in 3 months is recommended to ensure the stability or resolution of this finding. 3.  Aortic Atherosclerosis (ICD10-I70.0).        Assessment & Plan:

## 2018-11-21 ENCOUNTER — Encounter: Payer: Self-pay | Admitting: Internal Medicine

## 2018-11-21 NOTE — Assessment & Plan Note (Addendum)
Onset recurrent cough mid 1990s Allergy profile 07/20/2015 >  IgE 10, neg RAST, Eos  0.1  - start singulair 10 mg 07/20/2015 > improved on this plus gerd rx   - recurrence early Jan 2020 on prilosec 20 mg daily/ off singulair - MRI sinus 10/26/2018 : There is a mucous retention cyst within the right maxillary sinus.  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   These are the same pts (now being labeled as having "irritable larynx syndrome" by some cough centers) who not infrequently have a history of having failed to tolerate ace inhibitors,  dry powder inhalers or biphosphonates or report having atypical/extraesophageal reflux symptoms that don't respond to standard doses of PPI  and are easily confused as having aecopd or asthma flares by even experienced allergists/ pulmonologists (myself included).    Of the three most common causes of  Sub-acute / recurrent or chronic cough, only one (GERD)  can actually contribute to/ trigger  the other two (asthma and post nasal drip syndrome)  and perpetuate the cylce of cough.  While not intuitively obvious, many patients with chronic low grade reflux do not cough until there is a primary insult that disturbs the protective epithelial barrier and exposes sensitive nerve endings.   This is typically viral but can due to PNDS and  either may apply here.      >>> The point  is that once this occurs, it is difficult to eliminate the cycle  using anything but a maximally effective acid suppression regimen at least in the short run, accompanied by an appropriate diet to address non acid GERD and control / eliminate the cough itself with gabapentin and eliminate pnds with 1st gen H1 blockers per guidelines     F/u in 4 weeks, call sooner if needed    Total time devoted to counseling  > 50 % of initial 60 min office visit since pt not seen in < 3 y:  review case with pt/ discussion of options/alternatives/ personally creating written customized instructions  in presence of pt  then going over those specific  Instructions directly with the pt including how to use all of the meds but in particular covering each new medication in detail and the difference between the maintenance= "automatic" meds and the prns using an action plan format for the latter (If this problem/symptom => do that organization reading Left to right).  Please see AVS from this visit for a full list of these instructions which I personally wrote for this pt and  are unique to this visit.

## 2018-11-21 NOTE — Assessment & Plan Note (Signed)
Never smoker CT chest   05/22/15  mpns x 4 mm  CT chest 05/30/2016 1.An incidental finding of potential clinical significance has been found. RIGHT upper lobe round pulmonary nodule (7 mm) along the pleural surface. This nodule was not imaged on comparison cardiac CT. Non-contrast chest CT at 6-12 months is recommended. > repeat in 12 months (tickle file )  2. Stable RIGHT lower lobe and RIGHT upper lobe nodules compared to comparison CT 05/22/2015 consistent benign etiology - CT chest 05/31/2017 Pulmonary nodules measure up to 7 mm, stable from 05/30/2016. Additional follow-up CT chest without contrast in 1 year is Recommended> done 06/01/18 : Stable small right lung nodules measuring up to 7 mm, unchanged, benign. Greater than 2 year stability has been demonstrated dedicated follow-up imaging is not required - CT chest  Chest CT coronary 10/10/2018 1. New right hilar lymphadenopathy measuring up to 1.7 cm in short axis. This is of uncertain etiology and significance, but follow-up contrast enhanced chest CT is recommended in 3 months to ensure the resolution of this finding and to fully evaluate the lungs. 2. New nodular area of architectural distortion measuring 9 mm in the inferior segment of the lingula. This is favored to reflect an area of post infectious or inflammatory scarring, but close attention at time of repeat chest CT in 3 months is recommended to ensure the stability or resolution of this finding.   Most likely as per past eval, these findings will turn out to be incidental and are not related to her airway problem but do need f/u as above.   CT results reviewed with pt >>> Too small for PET or bx (could do ebus on node but < 2.0 cm no strong indication to do so here) , not suspicious enough for excisional bx > really only option for now is follow the Fleischner society guidelines as rec by radiology = 3 m f/u   I'll place this is in reminder file to be sure it gets done with  contrast ct in 3 m = 01/09/19

## 2018-11-27 DIAGNOSIS — G959 Disease of spinal cord, unspecified: Secondary | ICD-10-CM | POA: Diagnosis not present

## 2018-11-30 ENCOUNTER — Telehealth: Payer: Self-pay | Admitting: Diagnostic Neuroimaging

## 2018-11-30 NOTE — Telephone Encounter (Signed)
Called patient and advised her of Dr Gladstone Lighter reply. She stated she would try to get the records, verbalized appreciation for call back.

## 2018-11-30 NOTE — Telephone Encounter (Signed)
No signs of multiple sclerosis based on recent MRI scans. Please ask patient to get records from 2012 and I can review. -VRP

## 2018-11-30 NOTE — Telephone Encounter (Signed)
Pt stated in 2012 she was told she has onset of MS and she would like thoughts on it from  [place] , MD

## 2018-12-12 ENCOUNTER — Other Ambulatory Visit: Payer: Self-pay | Admitting: Internal Medicine

## 2018-12-17 ENCOUNTER — Ambulatory Visit: Payer: Medicare HMO | Admitting: Internal Medicine

## 2018-12-31 ENCOUNTER — Telehealth: Payer: Self-pay | Admitting: Nurse Practitioner

## 2018-12-31 NOTE — Telephone Encounter (Signed)
Reviewing charts with Dr. Acie Fredrickson, DOD, for lab appointments on 4/16. Patient is scheduled for bmet on 4/16.  BMET is preparation for CT of chest w/contrast on 4/23 due to 3 month f/u on enlarged lymph node seen on previous CT.  I spoke with Marzetta Board in [place] and she states CT appointments are limited to Mon, Wed, and Fri between 8 am and 2 pm and she is contacting providers to determine need to keep appointments. She asked me to copy this note to Dr. Marlou Porch for urgency. Lab appointment will be determined based on timing of CT.

## 2018-12-31 NOTE — Telephone Encounter (Signed)
Please copy reply to Freddi Starr in [place]

## 2019-01-01 ENCOUNTER — Other Ambulatory Visit: Payer: Self-pay

## 2019-01-01 ENCOUNTER — Other Ambulatory Visit: Payer: Medicare HMO

## 2019-01-01 DIAGNOSIS — Z01812 Encounter for preprocedural laboratory examination: Secondary | ICD-10-CM

## 2019-01-01 DIAGNOSIS — R590 Localized enlarged lymph nodes: Secondary | ICD-10-CM

## 2019-01-01 LAB — BASIC METABOLIC PANEL
BUN/Creatinine Ratio: 15 (ref 12–28)
BUN: 12 mg/dL (ref 8–27)
CO2: 21 mmol/L (ref 20–29)
Calcium: 9.4 mg/dL (ref 8.7–10.3)
Chloride: 107 mmol/L — ABNORMAL HIGH (ref 96–106)
Creatinine, Ser: 0.8 mg/dL (ref 0.57–1.00)
GFR calc Af Amer: 87 mL/min/{1.73_m2} (ref >59)
GFR calc non Af Amer: 75 mL/min/{1.73_m2} (ref >59)
Glucose: 97 mg/dL (ref 65–99)
Potassium: 4.7 mmol/L (ref 3.5–5.2)
Sodium: 145 mmol/L — ABNORMAL HIGH (ref 134–144)

## 2019-01-02 ENCOUNTER — Telehealth: Payer: Self-pay | Admitting: *Deleted

## 2019-01-02 NOTE — Telephone Encounter (Signed)
Spoke with patient and advised her that due to current COVID 19 pandemic, our office is severely reducing in person visits in order to minimize the risk to our patients and healthcare providers. We recommend to convert your appointment to a video visit. She asked why she needed to FU since she'd been called about all results of testing done in Jan. I reviewed Dr [facility] plan per Jan note. She was to get information from previous provider for Dr Leta Baptist to review re: possible MS. She stated she would call today and let me know if we needed to do anything further regarding that. She stated that Dr Melvyn Novas prescribed med for sleep, she has had less stress, and her headaches "are not bad". Rescheduled her for soonest FU in July and put her on wait list for June.  I advised her after Dr Leta Baptist reviews notes form previous provider she'll get a call with his response. She verbalized understanding, appreciation.

## 2019-01-03 ENCOUNTER — Other Ambulatory Visit: Payer: Medicare HMO

## 2019-01-03 ENCOUNTER — Telehealth: Payer: Self-pay | Admitting: Cardiology

## 2019-01-03 DIAGNOSIS — E7801 Familial hypercholesterolemia: Secondary | ICD-10-CM

## 2019-01-03 DIAGNOSIS — Z79899 Other long term (current) drug therapy: Secondary | ICD-10-CM

## 2019-01-03 NOTE — Telephone Encounter (Signed)
Tracey Henson rescheduled this study for a later date. CT impression from 10/10/2018 and recommendations: IMPRESSION: 1. New right hilar lymphadenopathy measuring up to 1.7 cm in short axis. This is of uncertain etiology and significance, but follow-up contrast enhanced chest CT is recommended in 3 months to ensure the resolution of this finding and to fully evaluate the lungs. 2. New nodular area of architectural distortion measuring 9 mm in the inferior segment of the lingula. This is favored to reflect an area of post infectious or inflammatory scarring, but close attention at time of repeat chest CT in 3 months is recommended to ensure the stability or resolution of this finding.

## 2019-01-03 NOTE — Telephone Encounter (Signed)
Spoke with patient who is aware the Lipid/ALT were not drawn.  Orders placed and pt will come back in on Monday for lab work.

## 2019-01-03 NOTE — Telephone Encounter (Signed)
In review of pt's chart and documentation, at the time of her 09/10/2018 Lipid/LFT - [facility] [place], pharD states lab would be repeated in 3 months making her due 12/10/2018.  No orders or appt were placed/made at that time.

## 2019-01-03 NOTE — Telephone Encounter (Signed)
New Message   Patient is calling in reference to her lab results. She states that cholesterol levels were to be checked but she is not showing any cholesterol readings. Please call to discuss.

## 2019-01-07 ENCOUNTER — Other Ambulatory Visit: Payer: Self-pay

## 2019-01-07 ENCOUNTER — Other Ambulatory Visit: Payer: Medicare HMO | Admitting: *Deleted

## 2019-01-07 DIAGNOSIS — Z79899 Other long term (current) drug therapy: Secondary | ICD-10-CM

## 2019-01-07 DIAGNOSIS — E7801 Familial hypercholesterolemia: Secondary | ICD-10-CM | POA: Diagnosis not present

## 2019-01-07 LAB — LIPID PANEL
Chol/HDL Ratio: 4.5 ratio — ABNORMAL HIGH (ref 0.0–4.4)
Cholesterol, Total: 197 mg/dL (ref 100–199)
HDL: 44 mg/dL (ref >39)
LDL Calculated: 123 mg/dL — ABNORMAL HIGH (ref 0–99)
Triglycerides: 148 mg/dL (ref 0–149)
VLDL Cholesterol Cal: 30 mg/dL (ref 5–40)

## 2019-01-07 LAB — ALT: ALT: 27 IU/L (ref 0–32)

## 2019-01-09 ENCOUNTER — Telehealth: Payer: Self-pay | Admitting: Cardiology

## 2019-01-09 NOTE — Telephone Encounter (Signed)
Pt called and wanted to go over the results of her labwork from Monday

## 2019-01-09 NOTE — Telephone Encounter (Signed)
I spoke with the patient and reviewed her lab results from Monday.  She verbalized understanding and was thankful for the call.

## 2019-01-10 ENCOUNTER — Inpatient Hospital Stay: Admission: RE | Admit: 2019-01-10 | Payer: Medicare HMO | Source: Ambulatory Visit

## 2019-01-14 ENCOUNTER — Ambulatory Visit: Payer: 59 | Admitting: Diagnostic Neuroimaging

## 2019-01-16 ENCOUNTER — Other Ambulatory Visit: Payer: Self-pay | Admitting: Cardiology

## 2019-01-17 ENCOUNTER — Other Ambulatory Visit: Payer: Self-pay | Admitting: Neurosurgery

## 2019-01-29 ENCOUNTER — Other Ambulatory Visit: Payer: Self-pay | Admitting: Internal Medicine

## 2019-01-29 MED ORDER — MONTELUKAST SODIUM 10 MG PO TABS: 10.0000 mg | ORAL_TABLET | Freq: Every day | ORAL | 3 refills | Status: DC

## 2019-01-30 ENCOUNTER — Other Ambulatory Visit: Payer: Self-pay | Admitting: Cardiology

## 2019-01-30 MED ORDER — PRAVASTATIN SODIUM 80 MG PO TABS: 80.0000 mg | ORAL_TABLET | Freq: Every evening | ORAL | 2 refills | Status: DC

## 2019-01-30 NOTE — Telephone Encounter (Signed)
Pt's medication was sent to pt's pharmacy as requested. Confirmation received.  °

## 2019-01-31 ENCOUNTER — Ambulatory Visit (INDEPENDENT_AMBULATORY_CARE_PROVIDER_SITE_OTHER): Payer: Medicare HMO | Admitting: Internal Medicine

## 2019-01-31 ENCOUNTER — Other Ambulatory Visit: Payer: Self-pay

## 2019-01-31 ENCOUNTER — Encounter: Payer: Self-pay | Admitting: Internal Medicine

## 2019-01-31 ENCOUNTER — Other Ambulatory Visit: Payer: Self-pay | Admitting: Internal Medicine

## 2019-01-31 DIAGNOSIS — R05 Cough: Secondary | ICD-10-CM

## 2019-01-31 DIAGNOSIS — R918 Other nonspecific abnormal finding of lung field: Secondary | ICD-10-CM | POA: Diagnosis not present

## 2019-01-31 DIAGNOSIS — R058 Other specified cough: Secondary | ICD-10-CM

## 2019-01-31 DIAGNOSIS — R0609 Other forms of dyspnea: Secondary | ICD-10-CM

## 2019-01-31 MED ORDER — MONTELUKAST SODIUM 10 MG PO TABS: 10.0000 mg | ORAL_TABLET | Freq: Every day | ORAL | 3 refills | Status: DC

## 2019-01-31 MED ORDER — FAMOTIDINE 20 MG PO TABS: ORAL_TABLET | ORAL | 3 refills | Status: DC

## 2019-01-31 NOTE — Assessment & Plan Note (Signed)
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  The other paranasal sinuses appear normal. - 11/20/2018 restarted singulair/ gabapentin rx as intol of narcs ("low pulse") > cough  resolved as of 01/31/2019   Never did the 1st gen H1 blockers per guidelines  But better to her satisfaction with cough but breathing no better.  rec no change rx x 6 weeks then return with all meds in hand using a trust but verify approach to confirm accurate Medication  Reconciliation The principal here is that until we are certain that the  patients are doing what we've asked, it makes no sense to ask them to do more.

## 2019-01-31 NOTE — Assessment & Plan Note (Signed)
Onset 2016 much worse since fall 2019 in setting of uacs - 06/08/2015  Walked RA x 3 laps @ 185 ft each stopped due to End of study, fast pace, min sob no desat     She has already had cards eval so next step is cpst once COVID - 19 restrictions have been lifted.   In meantime will try reconditioning

## 2019-01-31 NOTE — Patient Instructions (Signed)
No change in medications until return    Weight control is simply a matter of calorie balance which needs to be tilted in your favor by eating less and exercising more.  To get the most out of exercise, you need to be continuously aware that you are short of breath, but never out of breath, for 30 minutes daily. As you improve, it will actually be easier for you to do the same amount of exercise  in  30 minutes so always push to the level where you are short of breath.     If not better will schedule CPST on return   Pt has my chart but needs set up for office in 6 weeks with all meds

## 2019-01-31 NOTE — Progress Notes (Signed)
Subjective:    Patient ID: Tracey Henson, female    DOB: 12/19/1950,     MRN: [id number]    Brief patient profile:  31 yowf never smoker with h/o seasonal rhinitis  X 1980s then intermittent cough mid 90s not correlating with rhinitis maybe once or twice a week esp hs then doe progressive x 2016 and can't fast pace walking or hills due to sob > cardiac eval > CT heart > mpns > referred 06/08/2015 by Dr Nancy Fetter to pulmonary clinic.     History of Present Illness  06/08/2015 1st [facility] office visit/ Wert   Chief Complaint  Patient presents with  . Pulmonary Consult    Referred by Dr. Nancy Fetter for pulmonary nodules. Pt states that she has had DOE for "as long as I can remember"- gets out of breath pushing a lawn mower. She has had CP and cough that come and go.    onset of cough was indolent x 4 y prior to OV  assoc with sense of pnds daily and at hs but no excess mucus, then midline cp with severe coughing fits that occur once or twice a week assoc with doe and variable HB rec Try prilosec otc 20mg   Take 30-60 min before first meal of the day and Pepcid ac (famotidine) 20 mg one @  bedtime  Until return  For drainage take chlortrimeton (chlorpheniramine) 4 mg every 4 hours available over the counter (may cause drowsiness)  GERD diet  If still coughing you will need tessilon 200 mg up to 4 hours as needed    07/20/2015  f/u ov/Wert re: uacs / mpns Chief Complaint  Patient presents with  . Follow-up    Pt states her breathing is unchanged.  She is still coughing and constantly clearing her throat.   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 Add singulair 10 mg each pm for now For drainage / throat  tickle try take CHLORPHENIRAMINE  4 mg - take one every 4 hours as needed - available over the counter- may cause drowsiness so start with just a bedtime dose or two and see how you tolerate it before trying in daytime   (could take 2 at bedtime)  Prednisone 10 mg take  4 each am x 2 days,   2 each am x 2 days,  1 each am x 2 days and stop  Stay on acid suppression until cough is gone for at least 2 weeks without the need for cough meds   08/18/2015  f/u ov/Wert re: cough better on prilosec /singulair / antihistamines Chief Complaint  Patient presents with  . Follow-up    Pt states her cough has improved some. She is not clearing her throat as often. No new co's today.   breathing not an issue as long as doesn't push too hard  Noted more throat clearing when missed a dose of ppi Overall much better vs baseline  rec No change in meds x 3 months  If doing great > try pepcid 20 mg after bfast and supper  Please schedule a follow up visit in 3 months but call sooner  if needed> did not return as rec      11/20/2018  "New pt eval" Wert/ / does not remember whether h2 helped so now maint prilosec 1 h ac only 20  Chief Complaint  Patient presents with  . Pulmonary Consult    Rerferred by Dr Marlou Porch for pulmonary nodules. Pt c/o SOB and non prod cough x 7 wks. She states she gets SOB walking room to room.  She is sleeping propped up due to SOB when she lies down flat.  Dyspnea:  Sob and cough abruptly  started about the same time acute s obvious trigger/ sev rounds abx and pred s help now persistent day and noct  Cough: 24/7 dry mostly early on insp worse with  cold air /perfumes  Sleeping: can't sleep even sitting up due to cough / can't take any narcotics "my heart rate drops real low"  SABA use: none 02:  None  gen ant chest discomfort with coughing fits  rec Prednisone 10 mg take  4 each am x 2 days,   2 each am x 2 days,  1 each am x 2 days and stop  Gabapentin 100 mg three times daily   Restart the singulair 10 mg daily  For drainage / throat tickle try take CHLORPHENIRAMINE  4 mg (chlortabs at [employer]) - take one every 4 hours as needed - available over the counter- may cause drowsiness so start with just a bedtime dose or two and see how you tolerate it before trying in daytime    Pantoprazole (protonix) 40 mg   Take  30-60 min before first meal of the day and prilosec 30 min before the last  and Pepcid (famotidine)  20 mg one @  bedtime until return to office - this is the best way to tell whether stomach acid is contributing to your problem.   GERD diet     Virtual Visit via Telephone Note 01/31/2019   I connected with Tracey Henson on 01/31/19 at by telephone and verified that I am speaking with the correct person using two identifiers.   I discussed the limitations, risks, security and privacy concerns of performing an evaluation and management service by telephone and the availability of in person appointments. I also discussed with the patient that there may be a patient responsible charge related to this service. The patient expressed understanding and agreed to proceed.   History of Present Illness: Dyspnea:  No change fall 2019/ struggles with walking or uphills, vacuuming but can walk 45 min s stopping  Cough: resolved  Sleeping: flat in bed  SABA use: none  02: none     No obvious day to day or daytime variability or assoc excess/ purulent sputum or mucus plugs or hemoptysis or cp or chest tightness, subjective wheeze or overt sinus or hb symptoms.    Also denies any obvious fluctuation of symptoms with weather or environmental changes or other aggravating or alleviating factors except as outlined above.   Meds reviewed/ med reconciliation completed         Observations/Objective: Good voice texture / no throat clearing at all    Assessment and Plan: See problem list for active a/p's   Follow Up Instructions: See avs for instructions unique  to this ov which includes revised/ updated med list     I discussed the assessment and treatment plan with the patient. The patient was provided an opportunity to ask questions and all were answered. The patient agreed with  the plan and demonstrated an understanding of the instructions.   The patient was advised to call back or seek an in-person evaluation if the symptoms worsen or if the condition fails to improve as anticipated.  I provided 25 minutes of non-face-to-face time during this encounter.   Christinia Gully, MD

## 2019-01-31 NOTE — Assessment & Plan Note (Signed)
Never smoker CT chest   05/22/15  mpns x 4 mm  CT chest 05/30/2016 1.An incidental finding of potential clinical significance has been found. RIGHT upper lobe round pulmonary nodule (7 mm) along the pleural surface. This nodule was not imaged on comparison cardiac CT. Non-contrast chest CT at 6-12 months is recommended. > repeat in 12 months (tickle file )  2. Stable RIGHT lower lobe and RIGHT upper lobe nodules compared to comparison CT 05/22/2015 consistent benign etiology - CT chest 05/31/2017 Pulmonary nodules measure up to 7 mm, stable from 05/30/2016. Additional follow-up CT chest without contrast in 1 year is Recommended> done 06/01/18 : Stable small right lung nodules measuring up to 7 mm, unchanged, benign. Greater than 2 year stability has been demonstrated dedicated follow-up imaging is not required - CT chest  Chest CT coronary 10/10/2018 1. New right hilar lymphadenopathy measuring up to 1.7 cm in short axis. This is of uncertain etiology and significance, but follow-up contrast enhanced chest CT is recommended in 3 months to ensure the resolution of this finding and to fully evaluate the lungs. 2. New nodular area of architectural distortion measuring 9 mm in the inferior segment of the lingula. This is favored to reflect an area of post infectious or inflammatory scarring, but close attention at time of repeat chest CT in 3 months is recommended to ensure the stability or resolution of this finding. - 01/10/2019 pushed out another month by covid-19 restrictions to 03/06/19    Explained this would have nothing to do with her doe but does need f/u as planned.  Discussed in detail all the  indications, usual  risks and alternatives  relative to the benefits with patient who agrees to proceed with w/u as outlined.      Each maintenance medication was reviewed in detail including most importantly the difference between maintenance and as needed and under what circumstances the prns  are to be used.  Please see AVS for specific  Instructions which are unique to this visit and I personally typed out  which were reviewed in detail over the phone  with the patient and a copy provided via My Chart

## 2019-02-25 DIAGNOSIS — H524 Presbyopia: Secondary | ICD-10-CM | POA: Diagnosis not present

## 2019-03-05 ENCOUNTER — Other Ambulatory Visit: Payer: Medicare HMO

## 2019-03-05 ENCOUNTER — Other Ambulatory Visit: Payer: Self-pay

## 2019-03-05 ENCOUNTER — Telehealth: Payer: Self-pay

## 2019-03-05 ENCOUNTER — Telehealth: Payer: Self-pay | Admitting: *Deleted

## 2019-03-05 DIAGNOSIS — Z01812 Encounter for preprocedural laboratory examination: Secondary | ICD-10-CM | POA: Diagnosis not present

## 2019-03-05 LAB — BASIC METABOLIC PANEL
BUN/Creatinine Ratio: 16 (ref 12–28)
BUN: 13 mg/dL (ref 8–27)
CO2: 26 mmol/L (ref 20–29)
Calcium: 9.3 mg/dL (ref 8.7–10.3)
Chloride: 109 mmol/L — ABNORMAL HIGH (ref 96–106)
Creatinine, Ser: 0.83 mg/dL (ref 0.57–1.00)
GFR calc Af Amer: 83 mL/min/{1.73_m2} (ref >59)
GFR calc non Af Amer: 72 mL/min/{1.73_m2} (ref >59)
Glucose: 83 mg/dL (ref 65–99)
Potassium: 4 mmol/L (ref 3.5–5.2)
Sodium: 143 mmol/L (ref 134–144)

## 2019-03-05 NOTE — Telephone Encounter (Signed)
Notes recorded by Frederik Schmidt, RN on 03/05/2019 at 3:15 PM EDT  Left patient message that labs are normal and that if she has questions, she may call. 6/16  ------

## 2019-03-05 NOTE — Telephone Encounter (Signed)
Patient to come get labs 6/16 prior to CT appt 6/17

## 2019-03-05 NOTE — Telephone Encounter (Signed)

## 2019-03-06 ENCOUNTER — Ambulatory Visit (INDEPENDENT_AMBULATORY_CARE_PROVIDER_SITE_OTHER)
Admission: RE | Admit: 2019-03-06 | Discharge: 2019-03-06 | Disposition: A | Discharge status: Home / Self Care | Payer: Medicare HMO | Source: Ambulatory Visit | Attending: Cardiology | Admitting: Cardiology

## 2019-03-06 DIAGNOSIS — R590 Localized enlarged lymph nodes: Secondary | ICD-10-CM

## 2019-03-06 DIAGNOSIS — R591 Generalized enlarged lymph nodes: Secondary | ICD-10-CM | POA: Diagnosis not present

## 2019-03-06 MED ORDER — IOHEXOL 300 MG/ML  SOLN
80.0000 mL | Freq: Once | INTRAMUSCULAR | Status: AC | PRN
  Administered 2019-03-06: 80 mL via INTRAVENOUS

## 2019-03-18 ENCOUNTER — Other Ambulatory Visit: Payer: Self-pay

## 2019-03-18 ENCOUNTER — Ambulatory Visit (INDEPENDENT_AMBULATORY_CARE_PROVIDER_SITE_OTHER): Payer: Medicare HMO | Admitting: Internal Medicine

## 2019-03-18 ENCOUNTER — Encounter: Payer: Self-pay | Admitting: Internal Medicine

## 2019-03-18 ENCOUNTER — Ambulatory Visit: Payer: Medicare HMO | Admitting: Internal Medicine

## 2019-03-18 DIAGNOSIS — R05 Cough: Secondary | ICD-10-CM | POA: Diagnosis not present

## 2019-03-18 DIAGNOSIS — R0609 Other forms of dyspnea: Secondary | ICD-10-CM

## 2019-03-18 DIAGNOSIS — R058 Other specified cough: Secondary | ICD-10-CM

## 2019-03-18 LAB — CBC WITH DIFFERENTIAL/PLATELET
Basophils Absolute: 0 10*3/uL (ref 0.0–0.1)
Basophils Relative: 0.6 % (ref 0.0–3.0)
Eosinophils Absolute: 0.1 10*3/uL (ref 0.0–0.7)
Eosinophils Relative: 1.7 % (ref 0.0–5.0)
HCT: 39.6 % (ref 36.0–46.0)
Hemoglobin: 12.9 g/dL (ref 12.0–15.0)
Lymphocytes Relative: 28.8 % (ref 12.0–46.0)
Lymphs Abs: 1.8 10*3/uL (ref 0.7–4.0)
MCHC: 32.5 g/dL (ref 30.0–36.0)
MCV: 87.8 fl (ref 78.0–100.0)
Monocytes Absolute: 0.4 10*3/uL (ref 0.1–1.0)
Monocytes Relative: 6.2 % (ref 3.0–12.0)
Neutro Abs: 3.9 10*3/uL (ref 1.4–7.7)
Neutrophils Relative %: 62.7 % (ref 43.0–77.0)
Platelets: 219 10*3/uL (ref 150.0–400.0)
RBC: 4.52 Mil/uL (ref 3.87–5.11)
RDW: 13.5 % (ref 11.5–15.5)
WBC: 6.2 10*3/uL (ref 4.0–10.5)

## 2019-03-18 LAB — TSH: TSH: 1.36 u[IU]/mL (ref 0.35–4.50)

## 2019-03-18 LAB — BRAIN NATRIURETIC PEPTIDE: Pro B Natriuretic peptide (BNP): 74 pg/mL (ref 0.0–100.0)

## 2019-03-18 LAB — SEDIMENTATION RATE: Sed Rate: 9 mm/hr (ref 0–30)

## 2019-03-18 MED ORDER — OMEPRAZOLE 20 MG PO CPDR: DELAYED_RELEASE_CAPSULE | ORAL | Status: DC

## 2019-03-18 MED ORDER — MONTELUKAST SODIUM 10 MG PO TABS: 10.0000 mg | ORAL_TABLET | Freq: Every day | ORAL | 3 refills | Status: DC

## 2019-03-18 NOTE — Patient Instructions (Addendum)
To get the most out of exercise, you need to be continuously aware that you are short of breath, but never out of breath, for 30 minutes daily. As you improve, it will actually be easier for you to do the same amount of exercise  in  30 minutes so always push to the level where you are short of breath.     Please remember to go to the lab department   for your tests - we will call you with the results when they are available.      Please schedule a follow up visit in 3 months but call sooner if needed

## 2019-03-18 NOTE — Progress Notes (Signed)
Subjective:    Patient ID: Tracey Henson, female    DOB: 16-Feb-1951,     MRN: [id number]    Brief patient profile:  22 yowf never smoker with h/o seasonal rhinitis  X 1980s then intermittent cough mid 90s not correlating with rhinitis maybe once or twice a week esp hs then doe progressive x 2016 and can't fast pace walking or hills due to sob > cardiac eval > CT heart > mpns > referred 06/08/2015 by Dr Nancy Fetter to pulmonary clinic.     History of Present Illness  06/08/2015 1st [facility] office visit/ Tracey Henson   Chief Complaint  Patient presents with   Pulmonary Consult    Referred by Dr. Nancy Fetter for pulmonary nodules. Pt states that she has had DOE for "as long as I can remember"- gets out of breath pushing a lawn mower. She has had CP and cough that come and go.    onset of cough was indolent x 4 y prior to OV  assoc with sense of pnds daily and at hs but no excess mucus, then midline cp with severe coughing fits that occur once or twice a week assoc with doe and variable HB rec Try prilosec otc 20mg   Take 30-60 min before first meal of the day and Pepcid ac (famotidine) 20 mg one @  bedtime  Until return  For drainage take chlortrimeton (chlorpheniramine) 4 mg every 4 hours available over the counter (may cause drowsiness)  GERD diet  If still coughing you will need tessilon 200 mg up to 4 hours as needed    07/20/2015  f/u ov/Tracey Henson re: uacs / mpns Chief Complaint  Patient presents with   Follow-up    Pt states her breathing is unchanged.  She is still coughing and constantly clearing her throat.   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 Add singulair 10 mg each pm for now For drainage / throat  tickle try take CHLORPHENIRAMINE  4 mg - take one every 4 hours as needed - available over the counter- may cause drowsiness so start with just a bedtime dose or two and see how you tolerate it before trying in daytime   (could take 2 at bedtime)  Prednisone 10 mg take  4 each am x 2 days,   2 each am x 2 days,  1 each am x 2 days and stop  Stay on acid suppression until cough is gone for at least 2 weeks without the need for cough meds   08/18/2015  f/u ov/Tracey Henson re: cough better on prilosec /singulair / antihistamines Chief Complaint  Patient presents with   Follow-up    Pt states her cough has improved some. She is not clearing her throat as often. No new co's today.   breathing not an issue as long as doesn't push too hard  Noted more throat clearing when missed a dose of ppi Overall much better vs baseline  rec No change in meds x 3 months  If doing great > try pepcid 20 mg after bfast and supper  Please schedule a follow up visit in 3 months but call sooner  if needed> did not return as rec      11/20/2018  "New pt eval" Tracey Henson/ / does not remember whether h2 helped so now maint prilosec 1 h ac only 20 mg Chief Complaint  Patient presents with   Pulmonary Consult    Rerferred by Dr Marlou Porch for pulmonary nodules. Pt c/o SOB and non prod cough x 7 wks. She states she gets SOB walking room to room.  She is sleeping propped up due to SOB when she lies down flat.  Dyspnea:  Sob and cough abruptly  started about the same time acute s obvious trigger/ sev rounds abx and pred s help now persistent day and noct  Cough: 24/7 dry mostly early on insp worse with  cold air /perfumes  Sleeping: can't sleep even sitting up due to cough / can't take any narcotics "my heart rate drops real low"  SABA use: none 02:  None  gen ant chest discomfort with coughing fits  rec Prednisone 10 mg take  4 each am x 2 days,   2 each am x 2 days,  1 each am x 2 days and stop  Gabapentin 100 mg three times daily    Restart the singulair 10 mg daily  For drainage / throat tickle try take CHLORPHENIRAMINE  4 mg  Pantoprazole (protonix) 40 mg   Take  30-60 min before first meal of the day and prilosec 30 min before the last  and Pepcid (famotidine)  20 mg one @  bedtime  GERD diet      televist 01/31/19 No change in medications until return  Weight control is simply a matter of calorie balance    If not better will schedule CPST on return  Pt has my chart but needs set up for office in 6 weeks with all meds     03/18/2019  f/u ov/Tracey Henson re:  Chest tight x 5 years daily symptoms  Chief Complaint  Patient presents with   Follow-up    Cough is some better since the last visit, but not resolved.   Dyspnea:  MMRC3 = can't walk 100 yards even at a slow pace at a flat grade s stopping due to sob   Cough: better ? Did she have  covid as exp [place] jan 2020 and sick on way back home Sleeping: no resp symptoms flat SABA use: none 02: none  Better with singulair    No obvious day to day or daytime variability or assoc excess/ purulent sputum or mucus plugs or hemoptysis or subjective wheeze or overt sinus or hb symptoms.   Sleeping  without nocturnal  or early am exacerbation  of respiratory  c/o's or need for noct saba. Also denies any obvious fluctuation of symptoms with weather or environmental changes or other aggravating or alleviating factors except as outlined above   No unusual exposure hx or h/o childhood pna/ asthma or knowledge of premature birth.  Current Allergies, Complete Past Medical History, Past Surgical History, Family History, and Social History were reviewed in [employer] record.  ROS  The following are not active complaints unless bolded Hoarseness, sore throat, dysphagia, dental problems, itching, sneezing,  nasal congestion or discharge of excess mucus or purulent secretions, ear ache,   fever, chills, sweats, unintended wt loss or wt gain, classically  pleuritic or exertional cp,  orthopnea pnd or arm/hand swelling  or leg swelling, presyncope, palpitations, abdominal pain, anorexia, nausea, vomiting, diarrhea  or change in bowel habits  or change in bladder habits, change in stools or change in urine, dysuria, hematuria,  rash, arthralgias, visual complaints, headache, numbness, weakness or ataxia or problems with walking or coordination,  change in mood or  memory.        Current Meds  Medication Sig   cyanocobalamin 1000 MCG tablet Take 1,000 mcg by mouth daily.   cycloSPORINE (RESTASIS) 0.05 % ophthalmic emulsion Place 1 drop into both eyes 2 (two) times daily.   famotidine (PEPCID) 20 MG tablet One at bedtime   gabapentin (NEURONTIN) 100 MG capsule TAKE 1 CAPSULE (100 MG TOTAL) BY MOUTH 3 (THREE) TIMES DAILY. ONE THREE TIMES DAILY   omeprazole (PRILOSEC) 20 MG capsule Take one tablet 30-60 min before supper   pantoprazole (PROTONIX) 40 MG tablet Take 1 tablet (40 mg total) by mouth daily. Take 30-60 min before first meal of the day   pravastatin (PRAVACHOL) 80 MG tablet Take 1 tablet (80 mg total) by mouth every evening.                   Objective:   Physical Exam   Pleasant amb wf nad   03/18/2019    155  07/20/2015         139 >  08/18/2015 142 > 11/20/2018   155    06/08/15 140 lb (63.504 kg)  05/22/15 140 lb (63.504 kg)  07/04/14 138 lb (62.596 kg)        Vital signs reviewed - Note on arrival 02 sats  97% on RA   HEENT: nl dentition, turbinates bilaterally, and oropharynx. Nl external ear canals without cough reflex   NECK :  without JVD/Nodes/TM/ nl carotid upstrokes bilaterally   LUNGS: no acc muscle use,  Nl contour chest which is clear to A and P bilaterally without cough on insp or exp maneuvers   CV:  RRR  no s3 or murmur or increase in P2, and no edema   ABD:  soft and nontender with nl inspiratory excursion in the supine position. No bruits or organomegaly appreciated, bowel sounds nl  MS:   Nl gait/ ext warm without deformities, calf tenderness, cyanosis or clubbing No obvious joint restrictions   SKIN: warm and dry without lesions    NEURO:  alert, approp, nl sensorium with  no motor or cerebellar deficits apparent.         CT chest with contrast 03/06/19 Mediastinum/Nodes: Mediastinal lymph nodes measure up to 11 mm in the low right paratracheal station. Right hilar lymph node measures 1.4 cm. Findings are unchanged from multiple prior exams. No axillary adenopathy. Esophagus is grossly unremarkable.  Lungs/Pleura: Biapical pleuroparenchymal scarring. 8 mm high density granuloma in the anterior right upper lobe, as before. 3 mm subpleural right upper lobe nodule (series 3, image 62), also Unchanged.   Labs ordered/ reviewed:   Covid 19 serology neg         Chemistry      Component Value Date/Time   NA 144 03/18/2019 1225   NA 143 03/05/2019 1127   K 4.1 03/18/2019 1225   CL 108 03/18/2019 1225   CO2 25 03/18/2019 1225   BUN 10 03/18/2019 1225   BUN 13 03/05/2019 1127   CREATININE 0.90 03/18/2019 1225      Component Value Date/Time   CALCIUM 9.5 03/18/2019 1225                             Lab  Results  Component Value Date   WBC 6.2 03/18/2019   HGB 12.9 03/18/2019   HCT 39.6 03/18/2019   MCV 87.8 03/18/2019   PLT 219.0 03/18/2019       EOS                                                               0.1                                    03/18/2019   Lab Results  Component Value Date   DDIMER 0.38 03/18/2019      Lab Results  Component Value Date   TSH 1.36 03/18/2019     Lab Results  Component Value Date   PROBNP 74.0 03/18/2019       Lab Results  Component Value Date   ESRSEDRATE 9 03/18/2019            Assessment & Plan:

## 2019-03-19 ENCOUNTER — Encounter: Payer: Self-pay | Admitting: Internal Medicine

## 2019-03-19 LAB — BASIC METABOLIC PANEL
BUN: 10 mg/dL (ref 6–23)
CO2: 25 mEq/L (ref 19–32)
Calcium: 9.5 mg/dL (ref 8.4–10.5)
Chloride: 108 mEq/L (ref 96–112)
Creatinine, Ser: 0.9 mg/dL (ref 0.40–1.20)
GFR: 62.01 mL/min (ref >60.00)
Glucose, Bld: 102 mg/dL — ABNORMAL HIGH (ref 70–99)
Potassium: 4.1 mEq/L (ref 3.5–5.1)
Sodium: 144 mEq/L (ref 135–145)

## 2019-03-19 LAB — D-DIMER, QUANTITATIVE: D-Dimer, Quant: 0.38 mcg/mL FEU (ref <0.50)

## 2019-03-19 LAB — SAR COV2 SEROLOGY (COVID19)AB(IGG),IA: SARS CoV2 AB IGG: NEGATIVE

## 2019-03-19 NOTE — Assessment & Plan Note (Addendum)
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  The other paranasal sinuses appear normal. 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   These are the same pts (now being labeled as having "irritable larynx syndrome" by some cough centers) who not infrequently have a history of having failed to tolerate ace inhibitors,  dry powder inhalers or biphosphonates or report having atypical/extraesophageal reflux symptoms that don't respond to standard doses of PPI  and are easily confused as having aecopd or asthma flares by even experienced allergists/ pulmonologists (myself included).   >>>> No change rx needed, f/u in 3 m   I had an extended discussion with the patient   reviewing all relevant studies completed to date and  lasting 15 to 20 minutes of a 25 minute visit  which included directly observing ambulatory 02 saturation study documented in a/p section of  today's  office note.  Each maintenance medication was reviewed in detail including most importantly the  difference between maintenance and prns and under what circumstances the prns are to be triggered using an action plan format that is not reflected in the computer generated alphabetically organized AVS.     Please see AVS for specific instructions unique to this visit that I personally wrote and verbalized to the the pt in detail and then reviewed with pt  by my nurse highlighting any changes in therapy recommended at today's visit .

## 2019-03-19 NOTE — Assessment & Plan Note (Addendum)
-   06/08/2015  Walked RA x 3 laps @ 185 ft each stopped due to End of study, fast pace, min sob no desat    -  03/18/2019   Walked RA  2 laps @ approx 230ft each @ fast pace  stopped due to end of study, no desats, min sob   Symptoms are   disproportionate to objective findings and not clear to what extent this is actually a pulmonary  problem but pt does appear to have difficult to sort out respiratory symptoms of unknown origin for which  DDX  = almost all start with A and  include Adherence, Ace Inhibitors, Acid Reflux, Active Sinus Disease, Alpha 1 Antitripsin deficiency, Anxiety masquerading as Airways dz,  ABPA,  Allergy(esp in young), Aspiration (esp in elderly), Adverse effects of meds,  Active smoking or Vaping, A bunch of PE's/clot burden (a few small clots can't cause this syndrome unless there is already severe underlying pulm or vascular dz with poor reserve),  Anemia or thyroid disorder, plus two Bs  = Bronchiectasis and Beta blocker use..and one C= CHF   Adherence is always the initial "prime suspect" and is a multilayered concern that requires a "trust but verify" approach in every patient - starting with knowing how to use medications, especially inhalers, correctly, keeping up with refills and understanding the fundamental difference between maintenance and prns vs those medications only taken for a very short course and then stopped and not refilled.  - with all meds in hand using a trust but verify approach to confirm accurate Medication  Reconciliation The principal here is that until we are certain that the  patients are doing what we've asked, it makes no sense to ask them to do more.   ? Acid (or non-acid) GERD > always difficult to exclude as up to 75% of pts in some series report no assoc GI/ Heartburn symptoms> rec continue max (24h)  acid suppression and diet restrictions/ reviewed     ? Anxiety/deconditioning > usually at the bottom of this list of usual suspects but should be    higher on this pt's based on Hx   and may interfere with adherence and also interpretation of response or lack thereof to symptom management which can be quite subjective.   ? A bunch of PE's > D dimer nl - while a normal  or high normal value (seen commonly in the elderly or chronically ill)  may miss small peripheral pe, the clot burden with sob is moderately high and the d dimer  has a very high neg pred value if used in this setting.     ? Anemia/ thyroid dz > ruled out today  ? CHF > ruled out today with bnp < 100 with active symptoms    >>> rec reconditioning ex/ f/u in 3 m, call sooner if needed for CPST if not making progress and COVID - 19 restrictions have been lifted.

## 2019-03-20 ENCOUNTER — Telehealth: Payer: Self-pay | Admitting: Internal Medicine

## 2019-03-20 NOTE — Telephone Encounter (Signed)
Returned call to patient. Documentation re: lab results documented in labs tab. Nothing further needed.

## 2019-04-01 ENCOUNTER — Ambulatory Visit: Payer: Self-pay | Admitting: Diagnostic Neuroimaging

## 2019-04-01 ENCOUNTER — Encounter

## 2019-04-10 DIAGNOSIS — Z20828 Contact with and (suspected) exposure to other viral communicable diseases: Secondary | ICD-10-CM | POA: Diagnosis not present

## 2019-04-11 DIAGNOSIS — Z20828 Contact with and (suspected) exposure to other viral communicable diseases: Secondary | ICD-10-CM | POA: Diagnosis not present

## 2019-04-23 DIAGNOSIS — H6123 Impacted cerumen, bilateral: Secondary | ICD-10-CM | POA: Diagnosis not present

## 2019-05-15 DIAGNOSIS — L538 Other specified erythematous conditions: Secondary | ICD-10-CM | POA: Diagnosis not present

## 2019-05-15 DIAGNOSIS — L821 Other seborrheic keratosis: Secondary | ICD-10-CM | POA: Diagnosis not present

## 2019-05-15 DIAGNOSIS — Z789 Other specified health status: Secondary | ICD-10-CM | POA: Diagnosis not present

## 2019-05-15 DIAGNOSIS — B078 Other viral warts: Secondary | ICD-10-CM | POA: Diagnosis not present

## 2019-05-29 DIAGNOSIS — Z87898 Personal history of other specified conditions: Secondary | ICD-10-CM | POA: Diagnosis not present

## 2019-05-29 DIAGNOSIS — N644 Mastodynia: Secondary | ICD-10-CM | POA: Diagnosis not present

## 2019-05-30 ENCOUNTER — Other Ambulatory Visit: Payer: Self-pay | Admitting: Family Medicine

## 2019-05-30 DIAGNOSIS — N644 Mastodynia: Secondary | ICD-10-CM

## 2019-06-07 ENCOUNTER — Ambulatory Visit
Admission: RE | Admit: 2019-06-07 | Discharge: 2019-06-07 | Disposition: A | Discharge status: Home / Self Care | Payer: Medicare HMO | Source: Ambulatory Visit | Attending: Family Medicine | Admitting: Family Medicine

## 2019-06-07 ENCOUNTER — Other Ambulatory Visit: Payer: Self-pay

## 2019-06-07 DIAGNOSIS — R928 Other abnormal and inconclusive findings on diagnostic imaging of breast: Secondary | ICD-10-CM | POA: Diagnosis not present

## 2019-06-07 DIAGNOSIS — N644 Mastodynia: Secondary | ICD-10-CM

## 2019-06-18 ENCOUNTER — Telehealth: Payer: Self-pay | Admitting: Internal Medicine

## 2019-06-18 NOTE — Telephone Encounter (Signed)
Called the patient back and she stated that as of 06/05/19 she developed a soreness on her right side below armpit and by the outside portion of the breast (more towards her back). Patient stated since that time the soreness has moved to the left side. Has had a mammogram and ultrasound since 06/05/19 showing her breasts were clear, no issues found.  Patient concerned it may be her lymph nodes. Patient was able to confirm that when she breathes she is not having any pain. Also denied and difficulty with her breathing with ambulation (walking, sitting or getting up). Patient denied lifting anything heavy or having pulled a muscle to cause the soreness. Patient confirmed she is taking all medications issued as prescribed by Dr. Melvyn Novas.  I asked her if she can place the back of her hand on the area, the patient was able to confirm the area was warmer than the surrounding skin and was about the size of a 2 inch circle.  I advised the patient to contact her PCP first thing tomorrow morning to be set up for an appointment as this does not sound like a respiratory issue based on what she described. Recommended the patient still keep the appointment to see Dr. Melvyn Novas for 3 month follow up on 06/27/19. Patient voiced understanding.Nothing further needed at this time.

## 2019-06-19 ENCOUNTER — Ambulatory Visit: Payer: Medicare HMO | Admitting: Internal Medicine

## 2019-06-20 DIAGNOSIS — S29011A Strain of muscle and tendon of front wall of thorax, initial encounter: Secondary | ICD-10-CM | POA: Diagnosis not present

## 2019-06-27 ENCOUNTER — Encounter: Payer: Self-pay | Admitting: Internal Medicine

## 2019-06-27 ENCOUNTER — Ambulatory Visit (INDEPENDENT_AMBULATORY_CARE_PROVIDER_SITE_OTHER): Payer: Medicare HMO | Admitting: Internal Medicine

## 2019-06-27 ENCOUNTER — Other Ambulatory Visit: Payer: Self-pay

## 2019-06-27 DIAGNOSIS — R0600 Dyspnea, unspecified: Secondary | ICD-10-CM

## 2019-06-27 DIAGNOSIS — R918 Other nonspecific abnormal finding of lung field: Secondary | ICD-10-CM

## 2019-06-27 DIAGNOSIS — R05 Cough: Secondary | ICD-10-CM

## 2019-06-27 DIAGNOSIS — R0609 Other forms of dyspnea: Secondary | ICD-10-CM

## 2019-06-27 DIAGNOSIS — R058 Other specified cough: Secondary | ICD-10-CM

## 2019-06-27 NOTE — Assessment & Plan Note (Signed)
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  The other paranasal sinuses appear normal. - 11/20/2018 restarted singulair/ gabapentin rx as intol of narcs ("low pulse") > cough  improved as of 01/31/2019  - Covid 19 Antibodies 03/18/2019 = neg   - 06/27/2019 tapering off gabapenitin > f/u prn flare

## 2019-06-27 NOTE — Assessment & Plan Note (Signed)
Onset early adulthood  - 06/08/2015  Walked RA x 3 laps @ 185 ft each stopped due to End of study, fast pace, min sob no desat    -  03/18/2019   Walked RA  2 laps @ approx 28ft each @ fast pace  stopped due to end of study, no desats, min sob   Neg cards w/u and no change with saba or singulair assoc with chest tightness 24/7 that is not related to or proportional to ex intensity and doe only present with inclines, never on flat surface unless tries to jog.  Lack of variability or disturbing sleep is strongly suggestive of anxiety/ poor ex hygiene though have not completely excluded EIA or non-acid gerd > rec full pfts with cpst to follow  Discussed in detail all the  indications, usual  risks and alternatives  relative to the benefits with patient who agrees to proceed with w/u as outlined.     I had an extended discussion with the patient reviewing all relevant studies completed to date and  lasting 15 to 20 minutes of a 25 minute visit    Each maintenance medication was reviewed in detail including most importantly the difference between maintenance and prns and under what circumstances the prns are to be triggered using an action plan format that is not reflected in the computer generated alphabetically organized AVS.     Please see AVS for specific instructions unique to this visit that I personally wrote and verbalized to the the pt in detail and then reviewed with pt  by my nurse highlighting any  changes in therapy recommended at today's visit to their plan of care.

## 2019-06-27 NOTE — Progress Notes (Signed)
Subjective:    Patient ID: Nino Glow, female    DOB: 12/19/1950,     MRN: [id number]    Brief patient profile:  31 yowf never smoker with h/o seasonal rhinitis  X 1980s then intermittent cough mid 90s not correlating with rhinitis maybe once or twice a week esp hs then doe progressive x 2016 and can't fast pace walking or hills due to sob > cardiac eval > CT heart > mpns > referred 06/08/2015 by Dr Nancy Fetter to pulmonary clinic.     History of Present Illness  06/08/2015 1st [facility] office visit/ [facility]   Chief Complaint  Patient presents with  . Pulmonary Consult    Referred by Dr. Nancy Fetter for pulmonary nodules. Pt states that she has had DOE for "as long as I can remember"- gets out of breath pushing a lawn mower. She has had CP and cough that come and go.    onset of cough was indolent x 4 y prior to OV  assoc with sense of pnds daily and at hs but no excess mucus, then midline cp with severe coughing fits that occur once or twice a week assoc with doe and variable HB rec Try prilosec otc 20mg   Take 30-60 min before first meal of the day and Pepcid ac (famotidine) 20 mg one @  bedtime  Until return  For drainage take chlortrimeton (chlorpheniramine) 4 mg every 4 hours available over the counter (may cause drowsiness)  GERD diet  If still coughing you will need tessilon 200 mg up to 4 hours as needed    07/20/2015  f/u ov/Wert re: uacs / mpns Chief Complaint  Patient presents with  . Follow-up    Pt states her breathing is unchanged.  She is still coughing and constantly clearing her throat.   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 Add singulair 10 mg each pm for now For drainage / throat  tickle try take CHLORPHENIRAMINE  4 mg - take one every 4 hours as needed - available over the counter- may cause drowsiness so start with just a bedtime dose or two and see how you tolerate it before trying in daytime   (could take 2 at bedtime)  Prednisone 10 mg take  4 each am x 2 days,   2 each am x 2 days,  1 each am x 2 days and stop  Stay on acid suppression until cough is gone for at least 2 weeks without the need for cough meds   08/18/2015  f/u ov/Wert re: cough better on prilosec /singulair / antihistamines Chief Complaint  Patient presents with  . Follow-up    Pt states her cough has improved some. She is not clearing her throat as often. No new co's today.   breathing not an issue as long as doesn't push too hard  Noted more throat clearing when missed a dose of ppi Overall much better vs baseline  rec No change in meds x 3 months  If doing great > try pepcid 20 mg after bfast and supper  Please schedule a follow up visit in 3 months but call sooner  if needed> did not return as rec      11/20/2018  "New pt eval" Wert/ / does not remember whether h2 helped so now maint prilosec 1 h ac only 20 mg Chief Complaint  Patient presents with  . Pulmonary Consult    Rerferred by Dr Marlou Porch for pulmonary nodules. Pt c/o SOB and non prod cough x 7 wks. She states she gets SOB walking room to room.  She is sleeping propped up due to SOB when she lies down flat.  Dyspnea:  Sob and cough abruptly  started about the same time acute s obvious trigger/ sev rounds abx and pred s help now persistent day and noct  Cough: 24/7 dry mostly early on insp worse with  cold air /perfumes  Sleeping: can't sleep even sitting up due to cough / can't take any narcotics "my heart rate drops real low"  SABA use: none 02:  None  gen ant chest discomfort with coughing fits  rec Prednisone 10 mg take  4 each am x 2 days,   2 each am x 2 days,  1 each am x 2 days and stop  Gabapentin 100 mg three times daily   Restart the singulair 10 mg daily  For drainage / throat tickle try take CHLORPHENIRAMINE  4 mg  Pantoprazole (protonix) 40 mg   Take  30-60 min before first meal of the day and prilosec 30 min before the last  and Pepcid (famotidine)  20 mg one @  bedtime  GERD diet      televist 01/31/19 No change in medications until return  Weight control is simply a matter of calorie balance    If not better will schedule CPST on return  Pt has my chart but needs set up for office in 6 weeks with all meds     03/18/2019  f/u ov/Wert re:  Chest tight x 5 years daily symptoms  Chief Complaint  Patient presents with  . Follow-up    Cough is some better since the last visit, but not resolved.   Dyspnea:  MMRC3 = can't walk 100 yards even at a slow pace at a flat grade s stopping due to sob   Cough: better ? Did she have  covid as exp [place] jan 2020 and sick on way back home Sleeping: no resp symptoms flat SABA use: none 02: none  Better with singulair  rec To get the most out of exercise, you need to be continuously aware that you are short of breath, but never out of breath, for 30 minutes daily. As you improve, it will actually be easier for you to do the same amount of exercise  in  30 minutes so always push to the level where you are short of breath.    Please remember to go to the lab department   for your tests - we will call you with the results when they are available.   06/27/2019  f/u ov/Wert re: unexplained doe x adult life  / chest tightness not related to ex / skains eval 2012  Chief Complaint  Patient presents with  . Follow-up    Pt c/o increased SOB and chest tightness. She states her chest feels full.   Dyspnea:  Ever since adult has noted doe but just with hills or even slow jog Can do rapid walk all day  Cough: much better  Sleeping: ok but immediately aware of chest tightness once awake, does not however disturb sleep  SABA use: none, didn't worse 02: none  Stopping  gabapentin over next several weeks for neck pain    No obvious day to day or daytime variability or assoc excess/ purulent sputum or mucus plugs or hemoptysis   subjective wheeze or overt sinus or hb symptoms.   Sleeping ok  without nocturnal  or early am exacerbation  of respiratory  c/o's or need for noct saba. Also denies any obvious fluctuation of symptoms with weather or environmental changes or other aggravating or alleviating factors except as outlined above   No unusual exposure hx or h/o childhood pna/ asthma or knowledge of premature birth.  Current Allergies, Complete Past Medical History, Past Surgical History, Family History, and Social History were reviewed in [employer] record.  ROS  The following are not active complaints unless bolded Hoarseness, sore throat, dysphagia, dental problems, itching, sneezing,  nasal congestion or discharge of excess mucus or purulent secretions, ear ache,   fever, chills, sweats, unintended wt loss or wt gain, classically pleuritic or exertional cp,  orthopnea pnd or arm/hand swelling  or leg swelling, presyncope, palpitations, abdominal pain, anorexia, nausea, vomiting, diarrhea  or change in bowel habits or change in bladder habits, change in stools or change in urine, dysuria, hematuria,  rash, arthralgias, visual complaints, headache, numbness, weakness or ataxia or problems with walking or coordination,  change in mood or  memory.        Current Meds  Medication Sig  . cycloSPORINE (RESTASIS) 0.05 % ophthalmic emulsion Place 1 drop into both eyes 2 (two) times daily.  . famotidine (PEPCID) 20 MG tablet One at bedtime  . gabapentin (NEURONTIN) 100 MG capsule TAKE 1 CAPSULE (100 MG TOTAL) BY MOUTH 3 (THREE) TIMES DAILY. ONE THREE TIMES DAILY (Patient taking differently: Take 100 mg by mouth 3 (three) times daily. One three times daily every other day)  . montelukast (SINGULAIR) 10 MG tablet Take 1 tablet (10 mg total) by  mouth at bedtime.  Marland Kitchen omeprazole (PRILOSEC) 20 MG capsule Take 30-60 min before first meal of the day  . pravastatin (PRAVACHOL) 80 MG tablet Take 1 tablet (80 mg total) by mouth every evening.                   Objective:   Physical Exam   Depressed amb wf nad   06/27/2019     156  03/18/2019     155  07/20/2015         139 >  08/18/2015 142 > 11/20/2018   155    06/08/15 140 lb (63.504 kg)  05/22/15 140 lb (63.504 kg)  07/04/14 138 lb (62.596 kg)         HEENT : pt wearing mask not removed for exam due to covid -19 concerns.    NECK :  without JVD/Nodes/TM/ nl carotid upstrokes bilaterally   LUNGS: no acc muscle use,  Nl contour chest which is clear to A and P bilaterally without cough on insp or exp maneuvers   CV:  RRR  no s3 or murmur or increase in P2, and no edema   ABD:  soft and nontender with nl inspiratory excursion in the supine position. No bruits or organomegaly appreciated, bowel sounds nl  MS:  Nl gait/ ext warm without deformities, calf tenderness, cyanosis or clubbing No obvious joint restrictions   SKIN: warm and dry without lesions    NEURO:  alert, approp, nl sensorium with  no motor or cerebellar deficits  apparent.           I personally reviewed images and agree with radiology impression as - CT chest 03/06/2019 Mediastinal lymph nodes measure up to 11 mm in the low right paratracheal station. Right hilar lymph node measures 1.4 cm. Findings are unchanged from multiple prior exams Biapical pleuroparenchymal scarring. 8 mm high density granuloma in the anterior right upper lobe, as before. 3 mm subpleural right upper lobe nodule (series 3, image 62), also Unchanged        Assessment & Plan:

## 2019-06-27 NOTE — Assessment & Plan Note (Signed)
Never smoker CT chest   05/22/15  mpns x 4 mm  CT chest 05/30/2016 1.An incidental finding of potential clinical significance has been found. RIGHT upper lobe round pulmonary nodule (7 mm) along the pleural surface. This nodule was not imaged on comparison cardiac CT. Non-contrast chest CT at 6-12 months is recommended. > repeat in 12 months (tickle file )  2. Stable RIGHT lower lobe and RIGHT upper lobe nodules compared to comparison CT 05/22/2015 consistent benign etiology - CT chest 05/31/2017 Pulmonary nodules measure up to 7 mm, stable from 05/30/2016. Additional follow-up CT chest without contrast in 1 year is Recommended> done 06/01/18 : Stable small right lung nodules measuring up to 7 mm, unchanged, benign. Greater than 2 year stability has been demonstrated dedicated follow-up imaging is not required - CT chest  Chest CT coronary 10/10/2018 1. New right hilar lymphadenopathy measuring up to 1.7 cm in short axis. This is of uncertain etiology and significance, but follow-up contrast enhanced chest CT is recommended in 3 months to ensure the resolution of this finding and to fully evaluate the lungs. 2. New nodular area of architectural distortion measuring 9 mm in the inferior segment of the lingula.   - CT chest 03/06/2019 Mediastinal lymph nodes measure up to 11 mm in the low right paratracheal station. Right hilar lymph node measures 1.4 cm. Findings are unchanged from multiple prior exams Biapical pleuroparenchymal scarring. 8 mm high density granuloma in the anterior right upper lobe, as before. 3 mm subpleural right upper lobe nodule (series 3, image 62), also Unchanged>  No directed f/u needed     Discussed in detail all the  indications, usual  risks and alternatives  relative to the benefits with patient who agrees to proceed with no further w/u indicated.

## 2019-06-27 NOTE — Patient Instructions (Addendum)
Work on building up to 30 min short of breath but never out of breath daily   We will set you up for full pfts next available then refer CPST once this available (no office visit is needed)  Ct with contrast will need to be scheduled also - we will call you results

## 2019-07-31 DIAGNOSIS — J019 Acute sinusitis, unspecified: Secondary | ICD-10-CM | POA: Diagnosis not present

## 2019-08-15 DIAGNOSIS — Z20828 Contact with and (suspected) exposure to other viral communicable diseases: Secondary | ICD-10-CM | POA: Diagnosis not present

## 2019-08-30 DIAGNOSIS — Z23 Encounter for immunization: Secondary | ICD-10-CM | POA: Diagnosis not present

## 2019-09-09 ENCOUNTER — Other Ambulatory Visit: Payer: Self-pay | Admitting: Cardiology

## 2019-09-28 ENCOUNTER — Other Ambulatory Visit (HOSPITAL_COMMUNITY)
Admission: RE | Admit: 2019-09-28 | Discharge: 2019-09-28 | Disposition: A | Discharge status: Home / Self Care | Payer: Medicare HMO | Source: Ambulatory Visit | Attending: Internal Medicine | Admitting: Internal Medicine

## 2019-09-28 DIAGNOSIS — Z20822 Contact with and (suspected) exposure to covid-19: Secondary | ICD-10-CM | POA: Diagnosis not present

## 2019-09-28 DIAGNOSIS — Z01812 Encounter for preprocedural laboratory examination: Secondary | ICD-10-CM | POA: Diagnosis not present

## 2019-09-29 LAB — NOVEL CORONAVIRUS, NAA (HOSP ORDER, SEND-OUT TO REF LAB; TAT 18-24 HRS): SARS-CoV-2, NAA: NOT DETECTED

## 2019-10-01 ENCOUNTER — Other Ambulatory Visit: Payer: Self-pay | Admitting: Internal Medicine

## 2019-10-01 DIAGNOSIS — R0609 Other forms of dyspnea: Secondary | ICD-10-CM

## 2019-10-01 DIAGNOSIS — R058 Other specified cough: Secondary | ICD-10-CM

## 2019-10-01 DIAGNOSIS — R05 Cough: Secondary | ICD-10-CM

## 2019-10-01 NOTE — Progress Notes (Unsigned)
pft  

## 2019-10-02 ENCOUNTER — Ambulatory Visit (INDEPENDENT_AMBULATORY_CARE_PROVIDER_SITE_OTHER): Payer: Medicare HMO | Admitting: Internal Medicine

## 2019-10-02 ENCOUNTER — Other Ambulatory Visit: Payer: Self-pay

## 2019-10-02 DIAGNOSIS — R05 Cough: Secondary | ICD-10-CM

## 2019-10-02 DIAGNOSIS — R058 Other specified cough: Secondary | ICD-10-CM

## 2019-10-02 DIAGNOSIS — R0600 Dyspnea, unspecified: Secondary | ICD-10-CM

## 2019-10-02 DIAGNOSIS — R0609 Other forms of dyspnea: Secondary | ICD-10-CM

## 2019-10-02 LAB — PULMONARY FUNCTION TEST
DL/VA % pred: 100 %
DL/VA: 4.12 ml/min/mmHg/L
DLCO unc % pred: 102 %
DLCO unc: 20.75 ml/min/mmHg
FEF 25-75 Post: 2.45 L/sec
FEF 25-75 Pre: 1.82 L/sec
FEF2575-%Change-Post: 34 %
FEF2575-%Pred-Post: 122 %
FEF2575-%Pred-Pre: 91 %
FEV1-%Change-Post: 6 %
FEV1-%Pred-Post: 98 %
FEV1-%Pred-Pre: 91 %
FEV1-Post: 2.35 L
FEV1-Pre: 2.2 L
FEV1FVC-%Change-Post: 0 %
FEV1FVC-%Pred-Pre: 100 %
FEV6-%Change-Post: 7 %
FEV6-%Pred-Post: 101 %
FEV6-%Pred-Pre: 94 %
FEV6-Post: 3.07 L
FEV6-Pre: 2.85 L
FEV6FVC-%Change-Post: 0 %
FEV6FVC-%Pred-Post: 104 %
FEV6FVC-%Pred-Pre: 103 %
FVC-%Change-Post: 6 %
FVC-%Pred-Post: 97 %
FVC-%Pred-Pre: 91 %
FVC-Post: 3.07 L
FVC-Pre: 2.88 L
Post FEV1/FVC ratio: 77 %
Post FEV6/FVC ratio: 100 %
Pre FEV1/FVC ratio: 77 %
Pre FEV6/FVC Ratio: 99 %
RV % pred: 132 %
RV: 2.95 L
TLC % pred: 113 %
TLC: 5.89 L

## 2019-10-02 NOTE — Progress Notes (Signed)
PFT done today. 

## 2019-10-03 NOTE — Progress Notes (Signed)
Spoke with pt and notified of results per Dr. Melvyn Novas. Pt verbalized understanding and denied any questions. She states will call back later about the CPST. Does not want this ordered yet.

## 2019-10-31 ENCOUNTER — Other Ambulatory Visit: Payer: Self-pay | Admitting: Internal Medicine

## 2019-10-31 DIAGNOSIS — R05 Cough: Secondary | ICD-10-CM

## 2019-10-31 DIAGNOSIS — R058 Other specified cough: Secondary | ICD-10-CM

## 2019-11-28 DIAGNOSIS — Z974 Presence of external hearing-aid: Secondary | ICD-10-CM | POA: Diagnosis not present

## 2019-11-28 DIAGNOSIS — H938X3 Other specified disorders of ear, bilateral: Secondary | ICD-10-CM | POA: Diagnosis not present

## 2019-11-28 DIAGNOSIS — H9201 Otalgia, right ear: Secondary | ICD-10-CM | POA: Diagnosis not present

## 2019-11-28 DIAGNOSIS — H9113 Presbycusis, bilateral: Secondary | ICD-10-CM | POA: Diagnosis not present

## 2019-11-29 DIAGNOSIS — N644 Mastodynia: Secondary | ICD-10-CM | POA: Diagnosis not present

## 2019-11-29 DIAGNOSIS — B001 Herpesviral vesicular dermatitis: Secondary | ICD-10-CM | POA: Diagnosis not present

## 2019-11-29 DIAGNOSIS — N3281 Overactive bladder: Secondary | ICD-10-CM | POA: Diagnosis not present

## 2019-12-02 ENCOUNTER — Other Ambulatory Visit: Payer: Self-pay | Admitting: Family Medicine

## 2019-12-02 DIAGNOSIS — N644 Mastodynia: Secondary | ICD-10-CM

## 2019-12-20 ENCOUNTER — Other Ambulatory Visit: Payer: Self-pay

## 2019-12-20 ENCOUNTER — Ambulatory Visit
Admission: RE | Admit: 2019-12-20 | Discharge: 2019-12-20 | Disposition: A | Discharge status: Home / Self Care | Payer: Medicare HMO | Source: Ambulatory Visit | Attending: Family Medicine | Admitting: Family Medicine

## 2019-12-20 DIAGNOSIS — N644 Mastodynia: Secondary | ICD-10-CM

## 2019-12-20 DIAGNOSIS — R928 Other abnormal and inconclusive findings on diagnostic imaging of breast: Secondary | ICD-10-CM | POA: Diagnosis not present

## 2020-02-18 ENCOUNTER — Other Ambulatory Visit: Payer: Self-pay | Admitting: Internal Medicine

## 2020-02-19 DIAGNOSIS — E785 Hyperlipidemia, unspecified: Secondary | ICD-10-CM | POA: Diagnosis not present

## 2020-02-28 ENCOUNTER — Other Ambulatory Visit: Payer: Self-pay | Admitting: Cardiology

## 2020-03-03 DIAGNOSIS — I700 Atherosclerosis of aorta: Secondary | ICD-10-CM | POA: Diagnosis not present

## 2020-03-03 DIAGNOSIS — Z0000 Encounter for general adult medical examination without abnormal findings: Secondary | ICD-10-CM | POA: Diagnosis not present

## 2020-03-03 DIAGNOSIS — A6004 Herpesviral vulvovaginitis: Secondary | ICD-10-CM | POA: Diagnosis not present

## 2020-03-03 DIAGNOSIS — E785 Hyperlipidemia, unspecified: Secondary | ICD-10-CM | POA: Diagnosis not present

## 2020-03-03 DIAGNOSIS — N644 Mastodynia: Secondary | ICD-10-CM | POA: Diagnosis not present

## 2020-03-03 DIAGNOSIS — Z1389 Encounter for screening for other disorder: Secondary | ICD-10-CM | POA: Diagnosis not present

## 2020-03-06 ENCOUNTER — Other Ambulatory Visit: Payer: Self-pay | Admitting: Family Medicine

## 2020-03-06 DIAGNOSIS — E2839 Other primary ovarian failure: Secondary | ICD-10-CM

## 2020-03-11 ENCOUNTER — Ambulatory Visit
Admission: RE | Admit: 2020-03-11 | Discharge: 2020-03-11 | Disposition: A | Discharge status: Home / Self Care | Payer: Medicare HMO | Source: Ambulatory Visit | Attending: Family Medicine | Admitting: Family Medicine

## 2020-03-11 ENCOUNTER — Other Ambulatory Visit: Payer: Self-pay

## 2020-03-11 DIAGNOSIS — M85851 Other specified disorders of bone density and structure, right thigh: Secondary | ICD-10-CM | POA: Diagnosis not present

## 2020-03-11 DIAGNOSIS — Z780 Asymptomatic menopausal state: Secondary | ICD-10-CM | POA: Diagnosis not present

## 2020-03-11 DIAGNOSIS — E2839 Other primary ovarian failure: Secondary | ICD-10-CM

## 2020-04-27 DIAGNOSIS — H524 Presbyopia: Secondary | ICD-10-CM | POA: Diagnosis not present

## 2020-04-28 ENCOUNTER — Other Ambulatory Visit: Payer: Self-pay

## 2020-04-28 ENCOUNTER — Ambulatory Visit: Payer: Medicare HMO | Admitting: Cardiology

## 2020-04-28 ENCOUNTER — Encounter: Payer: Self-pay | Admitting: Cardiology

## 2020-04-28 VITALS — BP 144/60 | HR 62 | Ht 64.0 in | Wt 146.4 lb

## 2020-04-28 DIAGNOSIS — R931 Abnormal findings on diagnostic imaging of heart and coronary circulation: Secondary | ICD-10-CM

## 2020-04-28 DIAGNOSIS — I2510 Atherosclerotic heart disease of native coronary artery without angina pectoris: Secondary | ICD-10-CM | POA: Diagnosis not present

## 2020-04-28 DIAGNOSIS — E7800 Pure hypercholesterolemia, unspecified: Secondary | ICD-10-CM

## 2020-04-28 MED ORDER — PRAVASTATIN SODIUM 80 MG PO TABS: 80.0000 mg | ORAL_TABLET | Freq: Every day | ORAL | 3 refills | Status: DC

## 2020-04-28 NOTE — Patient Instructions (Signed)
Medication Instructions:  The current medical regimen is effective;  continue present plan and medications.  *If you need a refill on your cardiac medications before your next appointment, please call your pharmacy*  Follow-Up: At [facility], you and your health needs are our priority.  As part of our continuing mission to provide you with exceptional heart care, we have created designated Provider Care Teams.  These Care Teams include your primary Cardiologist (physician) and Advanced Practice Providers (APPs -  Physician Assistants and Nurse Practitioners) who all work together to provide you with the care you need, when you need it.  We recommend signing up for the patient portal called "MyChart".  Sign up information is provided on this After Visit Summary.  MyChart is used to connect with patients for Virtual Visits (Telemedicine).  Patients are able to view lab/test results, encounter notes, upcoming appointments, etc.  Non-urgent messages can be sent to your provider as well.   To learn more about what you can do with MyChart, go to [email or web address].    Your next appointment:   12 month(s)  The format for your next appointment:   In Person  Provider:   Mark Skains, MD   Thank you for choosing [facility]!!      

## 2020-04-28 NOTE — Progress Notes (Signed)
Cardiology Office Note:    Date:  04/28/2020   ID:  Tracey Henson, DOB 21-Apr-1951, MRN [id number]  PCP:  Donald Prose, MD  Cardiologist:  Candee Furbish, MD  Electrophysiologist:  None   Referring MD: Donald Prose, MD     History of Present Illness:    Tracey Henson is a 69 y.o. female with multiple statin intolerances currently seen by lipid clinic and recently placed on pravastatin 40 mg once a day on 07/12/2018 here for follow-up.  Has a strong family history of coronary artery disease father 51 siblings with CAD.  Nuclear stress test 2014 was low risk but did have upsloping ST segment depression.  CVD 10-year risk score is 8.7% falls in the statin benefit group.  She also had coronary calcium on CT with mild LAD calcification as well as aortic atherosclerosis.  Prior labs on 06/27/2017 showed LDL of 85.  Prior EKG from 01/31/2018 shows nonspecific ST-T wave changes, subtle upsloping noted in the lateral leads.  She states today that she has been waking up sometimes her heart pounding, increasing fatigue.  She also complained of some chest discomfort, jaw pain when chest is hurting as well.  Today she is chest pain-free.  Leg pain. Walking.  She is also waking up at night with leg cramping.  Mostly in her calves.  She does not like to walk the mall anymore because of leg pain.  Past Medical History:  Diagnosis Date  . Asthma    seasonal  . Baker cyst    left leg  . Baker's cyst, left    left leg  . Breast cyst   . Frequent headaches   . GERD (gastroesophageal reflux disease)   . Hearing loss    bilateral  . Hypercholesteremia   . Muscle cramps   . Vertigo     Past Surgical History:  Procedure Laterality Date  . ABDOMINAL HYSTERECTOMY  1987  . APPENDECTOMY  1963  . BILATERAL OOPHORECTOMY    . BREAST CYST EXCISION Bilateral pt unsure  . BREAST EXCISIONAL BIOPSY Right    x1  . BREAST EXCISIONAL BIOPSY Left    x2  . BREAST SURGERY  1990,1991   lumpectomy, 3 x R  breats, 2 x Left breast  . SEPTOPLASTY    . TEMPOROMANDIBULAR JOINT SURGERY  1998    Current Medications: Current Meds  Medication Sig  . cyanocobalamin 1000 MCG tablet Take 1,000 mcg by mouth daily.  . cycloSPORINE (RESTASIS) 0.05 % ophthalmic emulsion Place 1 drop into both eyes 2 (two) times daily.  . famotidine (PEPCID) 20 MG tablet TAKE 1 TABLET AT BEDTIME  . montelukast (SINGULAIR) 10 MG tablet TAKE 1 TABLET BY MOUTH EVERYDAY AT BEDTIME  . omeprazole (PRILOSEC) 20 MG capsule Take 30-60 min before first meal of the day  . pravastatin (PRAVACHOL) 80 MG tablet Take 1 tablet (80 mg total) by mouth daily. Call and schedule follow up office visit to continue refills. Thank you. [phone]  . [DISCONTINUED] pravastatin (PRAVACHOL) 80 MG tablet Take 1 tablet (80 mg total) by mouth daily. Call and schedule follow up office visit to continue refills. Thank you. [phone]     Allergies:   Cefaclor, Codeine, Crestor [rosuvastatin], and Oxybutynin   Social History   Socioeconomic History  . Marital status: Single    Spouse name: Not on file  . Number of children: 2  . Years of education: assoc degree  . Highest education level: Associate degree: academic program  Occupational  History    Comment: [employer]  Tobacco Use  . Smoking status: Never Smoker  . Smokeless tobacco: Never Used  Vaping Use  . Vaping Use: Never used  Substance and Sexual Activity  . Alcohol use: No    Alcohol/week: 0.0 standard drinks    Comment: Occassional wine  . Drug use: Never  . Sexual activity: Not on file  Other Topics Concern  . Not on file  Social History Narrative   Caffeine- none. Lives home alone.  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  3 children.  Caffein 2-3 cups daily (sometimes)   Social Determinants of Health   Financial Resource Strain:   . Difficulty of Paying Living Expenses:   Food Insecurity:   . Worried About Charity fundraiser in the Last  Year:   . Arboriculturist in the Last Year:   Transportation Needs:   . Film/video editor (Medical):   Marland Kitchen Lack of Transportation (Non-Medical):   Physical Activity:   . Days of Exercise per Week:   . Minutes of Exercise per Session:   Stress:   . Feeling of Stress :   Social Connections:   . Frequency of Communication with Friends and Family:   . Frequency of Social Gatherings with Friends and Family:   . Attends Religious Services:   . Active Member of Clubs or Organizations:   . Attends Archivist Meetings:   Marland Kitchen Marital Status:      Family History: The patient's family history includes Cancer in her maternal aunt and paternal grandmother; Coronary artery disease in her father, maternal grandfather, and mother; Heart attack in her mother; Heart attack (age of onset: 71) in her father; Heart attack (age of onset: 28) in her maternal grandfather; Heart disease in her sister; Hypertension in her brother and sister; Lung cancer (age of onset: 32) in her mother; Stroke (age of onset: 64) in her maternal grandmother.  ROS:   Please see the history of present illness.    Nuys any fevers chills nausea vomiting syncope bleeding.  Positive for fatigue, some anxieties.  All other systems reviewed and are negative.  EKGs/Labs/Other Studies Reviewed:    The following studies were reviewed today: Prior office notes, lab work, EKG  Coronary CT 10/10/2018 1. Coronary calcium score of 183. This was 23 percentile for age and sex matched control.  2. Normal coronary origin with right dominance.  3. Mild diffuse non-obstructive CAD. Aggressive risk factor modification is recommended. LDL 106 on 02/22/20  EKG: 04/28/2020-today sinus rhythm 62 with no other abnormalities prior as above in HPI nonspecific ST-T wave changes  Recent Labs: No results found for requested labs within last 8760 hours.  Recent Lipid Panel    Component Value Date/Time   CHOL 197 01/07/2019 1016   CHOL  221 (H) 05/20/2015 0814   TRIG 148 01/07/2019 1016   TRIG 88 05/20/2015 0814   HDL 44 01/07/2019 1016   HDL 50 05/20/2015 0814   CHOLHDL 4.5 (H) 01/07/2019 1016   CHOLHDL 3.2 08/25/2015 0746   VLDL 15 08/25/2015 0746   LDLCALC 123 (H) 01/07/2019 1016   LDLCALC 153 (H) 05/20/2015 0814    Physical Exam:    VS:  BP (!) 144/60   Pulse 62   Ht 5\' 4"  (1.626 m)   Wt 146 lb 6.4 oz (66.4 kg)   LMP  (LMP Unknown)   BMI 25.13 kg/m     Wt  Readings from Last 3 Encounters:  04/28/20 146 lb 6.4 oz (66.4 kg)  06/27/19 156 lb (70.8 kg)  03/18/19 155 lb (70.3 kg)     GEN:  Well nourished, well developed in no acute distress HEENT: Normal NECK: No JVD; No carotid bruits LYMPHATICS: No lymphadenopathy CARDIAC: RRR, no murmurs, rubs, gallops RESPIRATORY:  Clear to auscultation without rales, wheezing or rhonchi  ABDOMEN: Soft, non-tender, non-distended MUSCULOSKELETAL:  No edema; No deformity  SKIN: Warm and dry NEUROLOGIC:  Alert and oriented x 3 PSYCHIATRIC:  Normal affect, mildly anxious  ASSESSMENT:    1. Coronary artery disease involving native coronary artery of native heart without angina pectoris   2. Pure hypercholesterolemia   3. Elevated coronary artery calcium score    PLAN:    In order of problems listed above:  Hyperlipidemia with strong family history of CAD - Appreciate lipid clinic assistance.  Pravastatin 80 mg was last change after she wanted to switch off of Livalo.  This was in October 2019. -LDL 140 on 09/10/2018, HDL 55, total cholesterol 224, triglycerides 143, creatinine 0.8 -LDL down to 105 on 02/19/2020, [facility] labs reviewed.  Continue with pravastatin 80.    Aortic atherosclerosis and coronary artery calcification -Continue statin use to help reduce cardiovascular morbidity.    Chest discomfort with jaw pain - Anginal symptoms.  On CT scan she had mild plaque noted in all 3 vessels LAD RCA and left circumflex.  She had a right-sided hilar lymph node 1.7  cm, had repeat CT scan and this was stable.  All 13 of her father's siblings had CAD.  Continue with risk factor modification.  GERD -On Pepcid and omeprazole.  When laying down at night sometimes she does feel a balloon-like sensation in her mid epigastrium.  This could certainly be GERD/gas related.  She may wish to try simethicone or Gas-X.  She can experiment with timing of omeprazole.  1 year follow-up  Medication Adjustments/Labs and Tests Ordered: Current medicines are reviewed at length with the patient today.  Concerns regarding medicines are outlined above.  Orders Placed This Encounter  Procedures  . EKG 12-Lead   Meds ordered this encounter  Medications  . pravastatin (PRAVACHOL) 80 MG tablet    Sig: Take 1 tablet (80 mg total) by mouth daily. Call and schedule follow up office visit to continue refills. Thank you. [phone]    Dispense:  90 tablet    Refill:  3    Patient Instructions  Medication Instructions:  The current medical regimen is effective;  continue present plan and medications.  *If you need a refill on your cardiac medications before your next appointment, please call your pharmacy*  Follow-Up: At [facility], you and your health needs are our priority.  As part of our continuing mission to provide you with exceptional heart care, we have created designated Provider Care Teams.  These Care Teams include your primary Cardiologist (physician) and Advanced Practice Providers (APPs -  Physician Assistants and Nurse Practitioners) who all work together to provide you with the care you need, when you need it.  We recommend signing up for the patient portal called "MyChart".  Sign up information is provided on this After Visit Summary.  MyChart is used to connect with patients for Virtual Visits (Telemedicine).  Patients are able to view lab/test results, encounter notes, upcoming appointments, etc.  Non-urgent messages can be sent to your provider as well.    To learn more about what you can do with  MyChart, go to [email or web address].    Your next appointment:   12 month(s)  The format for your next appointment:   In Person  Provider:   Candee Furbish, MD   Thank you for choosing [facility]!!        Signed, Candee Furbish, MD  04/28/2020 5:27 PM    [place]

## 2020-05-04 DIAGNOSIS — H25043 Posterior subcapsular polar age-related cataract, bilateral: Secondary | ICD-10-CM | POA: Diagnosis not present

## 2020-05-04 DIAGNOSIS — H25013 Cortical age-related cataract, bilateral: Secondary | ICD-10-CM | POA: Diagnosis not present

## 2020-05-04 DIAGNOSIS — H2512 Age-related nuclear cataract, left eye: Secondary | ICD-10-CM | POA: Diagnosis not present

## 2020-05-04 DIAGNOSIS — H2513 Age-related nuclear cataract, bilateral: Secondary | ICD-10-CM | POA: Diagnosis not present

## 2020-05-04 DIAGNOSIS — H40013 Open angle with borderline findings, low risk, bilateral: Secondary | ICD-10-CM | POA: Diagnosis not present

## 2020-05-12 DIAGNOSIS — H9319 Tinnitus, unspecified ear: Secondary | ICD-10-CM | POA: Diagnosis not present

## 2020-05-12 DIAGNOSIS — L989 Disorder of the skin and subcutaneous tissue, unspecified: Secondary | ICD-10-CM | POA: Diagnosis not present

## 2020-05-12 DIAGNOSIS — R0789 Other chest pain: Secondary | ICD-10-CM | POA: Diagnosis not present

## 2020-05-14 DIAGNOSIS — H9311 Tinnitus, right ear: Secondary | ICD-10-CM | POA: Diagnosis not present

## 2020-05-14 DIAGNOSIS — M26609 Unspecified temporomandibular joint disorder, unspecified side: Secondary | ICD-10-CM | POA: Diagnosis not present

## 2020-05-14 DIAGNOSIS — H903 Sensorineural hearing loss, bilateral: Secondary | ICD-10-CM | POA: Diagnosis not present

## 2020-05-14 DIAGNOSIS — H9201 Otalgia, right ear: Secondary | ICD-10-CM | POA: Diagnosis not present

## 2020-06-02 DIAGNOSIS — L820 Inflamed seborrheic keratosis: Secondary | ICD-10-CM | POA: Diagnosis not present

## 2020-06-02 DIAGNOSIS — L814 Other melanin hyperpigmentation: Secondary | ICD-10-CM | POA: Diagnosis not present

## 2020-06-02 DIAGNOSIS — R208 Other disturbances of skin sensation: Secondary | ICD-10-CM | POA: Diagnosis not present

## 2020-06-02 DIAGNOSIS — L538 Other specified erythematous conditions: Secondary | ICD-10-CM | POA: Diagnosis not present

## 2020-06-02 DIAGNOSIS — D1801 Hemangioma of skin and subcutaneous tissue: Secondary | ICD-10-CM | POA: Diagnosis not present

## 2020-06-02 DIAGNOSIS — D485 Neoplasm of uncertain behavior of skin: Secondary | ICD-10-CM | POA: Diagnosis not present

## 2020-06-02 DIAGNOSIS — C44519 Basal cell carcinoma of skin of other part of trunk: Secondary | ICD-10-CM | POA: Diagnosis not present

## 2020-06-11 DIAGNOSIS — H2511 Age-related nuclear cataract, right eye: Secondary | ICD-10-CM | POA: Diagnosis not present

## 2020-06-12 DIAGNOSIS — H2512 Age-related nuclear cataract, left eye: Secondary | ICD-10-CM | POA: Diagnosis not present

## 2020-06-19 ENCOUNTER — Other Ambulatory Visit: Payer: Self-pay | Admitting: Family Medicine

## 2020-06-22 ENCOUNTER — Other Ambulatory Visit: Payer: Self-pay | Admitting: Family Medicine

## 2020-06-22 DIAGNOSIS — G8929 Other chronic pain: Secondary | ICD-10-CM

## 2020-06-25 DIAGNOSIS — H2512 Age-related nuclear cataract, left eye: Secondary | ICD-10-CM | POA: Diagnosis not present

## 2020-07-07 DIAGNOSIS — C44519 Basal cell carcinoma of skin of other part of trunk: Secondary | ICD-10-CM | POA: Diagnosis not present

## 2020-07-10 ENCOUNTER — Ambulatory Visit
Admission: RE | Admit: 2020-07-10 | Discharge: 2020-07-10 | Disposition: A | Discharge status: Home / Self Care | Payer: Medicare HMO | Source: Ambulatory Visit | Attending: Family Medicine | Admitting: Family Medicine

## 2020-07-10 DIAGNOSIS — G8929 Other chronic pain: Secondary | ICD-10-CM

## 2020-07-10 DIAGNOSIS — R059 Cough, unspecified: Secondary | ICD-10-CM | POA: Diagnosis not present

## 2020-08-26 ENCOUNTER — Ambulatory Visit: Payer: Medicare HMO | Admitting: Internal Medicine

## 2020-08-27 ENCOUNTER — Ambulatory Visit: Payer: Medicare HMO | Admitting: Pulmonary Disease

## 2020-09-03 DIAGNOSIS — M4712 Other spondylosis with myelopathy, cervical region: Secondary | ICD-10-CM | POA: Diagnosis not present

## 2020-09-17 DIAGNOSIS — M4722 Other spondylosis with radiculopathy, cervical region: Secondary | ICD-10-CM | POA: Diagnosis not present

## 2020-09-17 DIAGNOSIS — M4712 Other spondylosis with myelopathy, cervical region: Secondary | ICD-10-CM | POA: Diagnosis not present

## 2020-09-22 DIAGNOSIS — R030 Elevated blood-pressure reading, without diagnosis of hypertension: Secondary | ICD-10-CM | POA: Diagnosis not present

## 2020-09-22 DIAGNOSIS — M5412 Radiculopathy, cervical region: Secondary | ICD-10-CM | POA: Diagnosis not present

## 2020-09-22 DIAGNOSIS — Z6826 Body mass index (BMI) 26.0-26.9, adult: Secondary | ICD-10-CM | POA: Diagnosis not present

## 2020-09-23 ENCOUNTER — Other Ambulatory Visit: Payer: Self-pay | Admitting: Neurological Surgery

## 2020-10-06 DIAGNOSIS — Z1152 Encounter for screening for COVID-19: Secondary | ICD-10-CM | POA: Diagnosis not present

## 2020-10-07 ENCOUNTER — Ambulatory Visit: Payer: Medicare HMO | Admitting: Internal Medicine

## 2020-10-09 ENCOUNTER — Ambulatory Visit: Admit: 2020-10-09 | Payer: Medicare HMO | Admitting: Neurological Surgery

## 2020-10-09 DIAGNOSIS — M2578 Osteophyte, vertebrae: Secondary | ICD-10-CM | POA: Diagnosis not present

## 2020-10-09 DIAGNOSIS — M4802 Spinal stenosis, cervical region: Secondary | ICD-10-CM | POA: Diagnosis not present

## 2020-10-09 DIAGNOSIS — M4722 Other spondylosis with radiculopathy, cervical region: Secondary | ICD-10-CM | POA: Diagnosis not present

## 2020-10-09 SURGERY — ANTERIOR CERVICAL DECOMPRESSION/DISCECTOMY FUSION 1 LEVEL
Anesthesia: General

## 2020-10-12 IMAGING — MR MR THORACIC SPINE W/O CM
4 of 6 series · 21 of 48 positions shown · non-contrast
Comparison: CT chest June 01, 2018

CLINICAL DATA: Mid back pain after tripping over forklift June 09, 2018.

EXAM:
MRI THORACIC SPINE WITHOUT CONTRAST
TECHNIQUE: Multiplanar, multisequence MR imaging of the thoracic spine was
performed. No intravenous contrast was administered.

[Series 5: T2 · sagittal · 4.0mm · 0.50mm/px · 5 of 12 slices shown (1 of 2)]
[im 1/12]
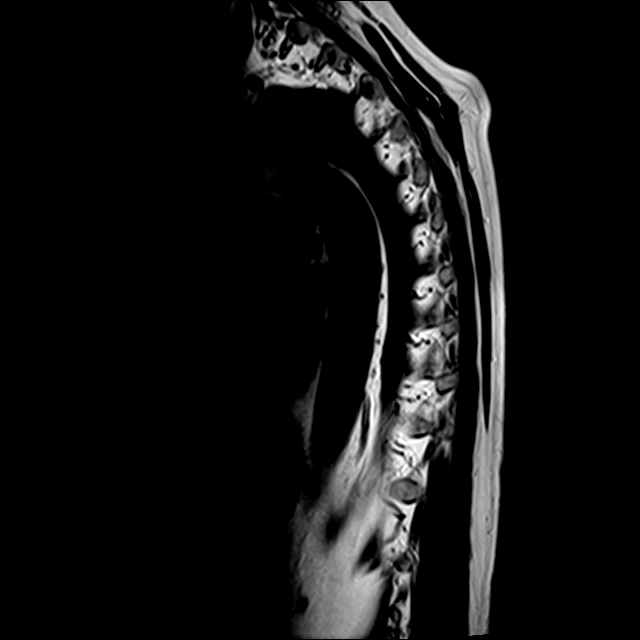
[im 3/12]
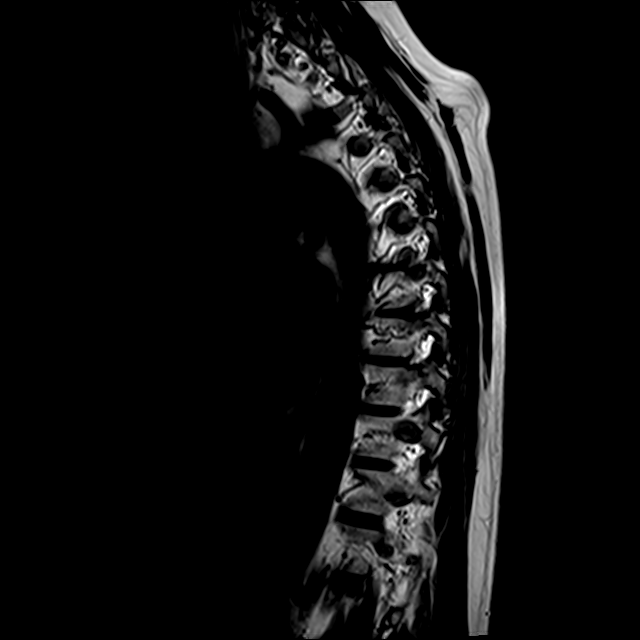
[im 6/12]
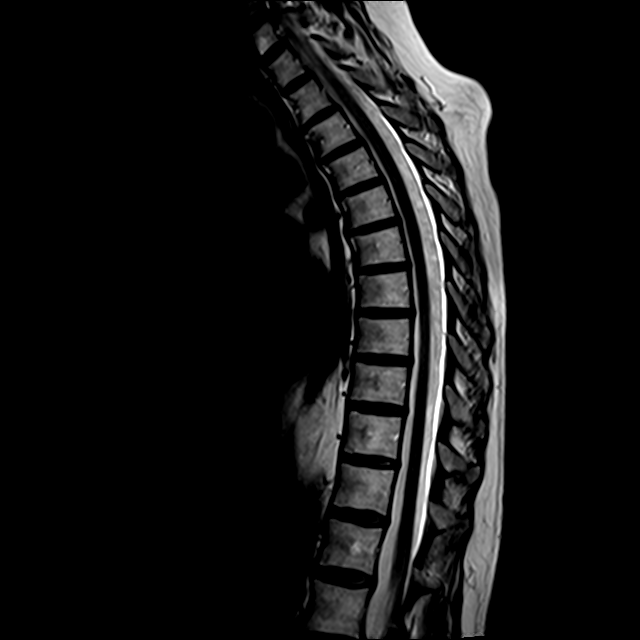
[im 9/12]
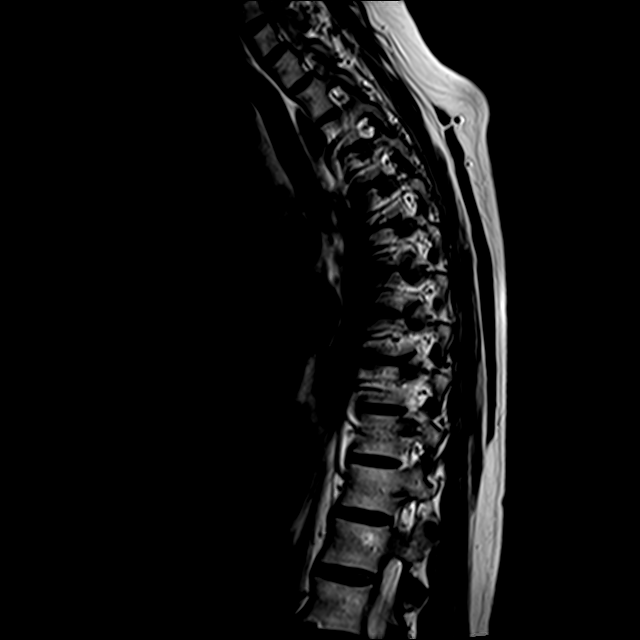
[im 12/12]
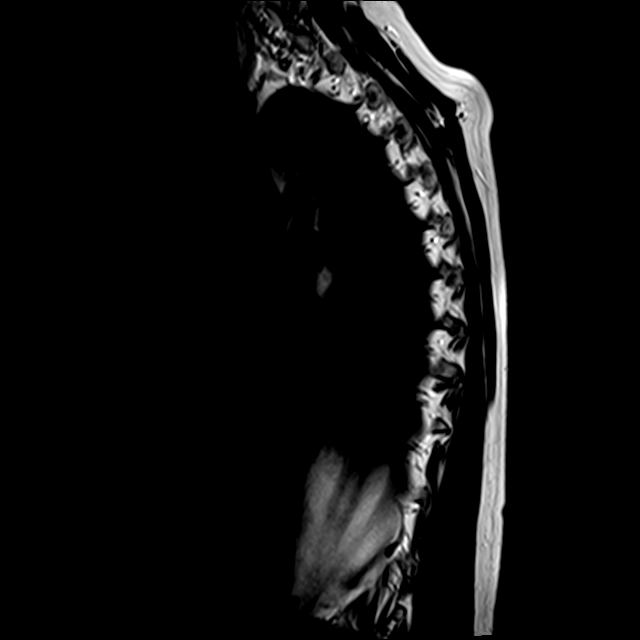

[Series 6: T1 · sagittal · 4.0mm · 0.50mm/px · 4 of 12 slices shown]
[im 1/12]
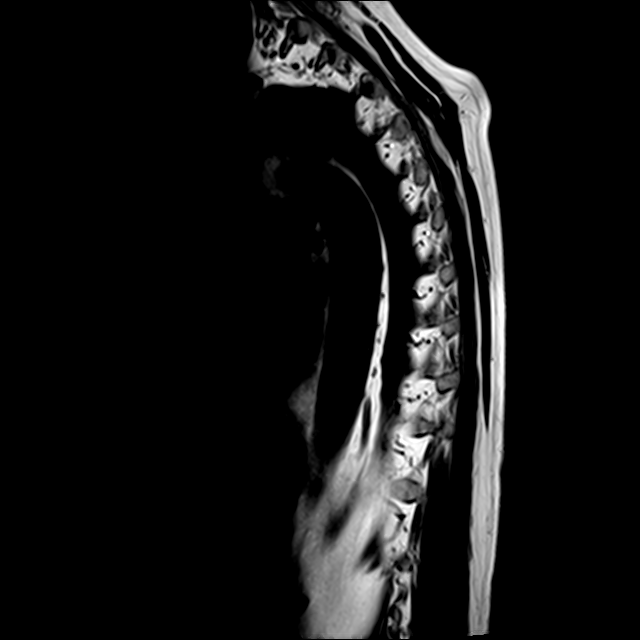
[im 3/12]
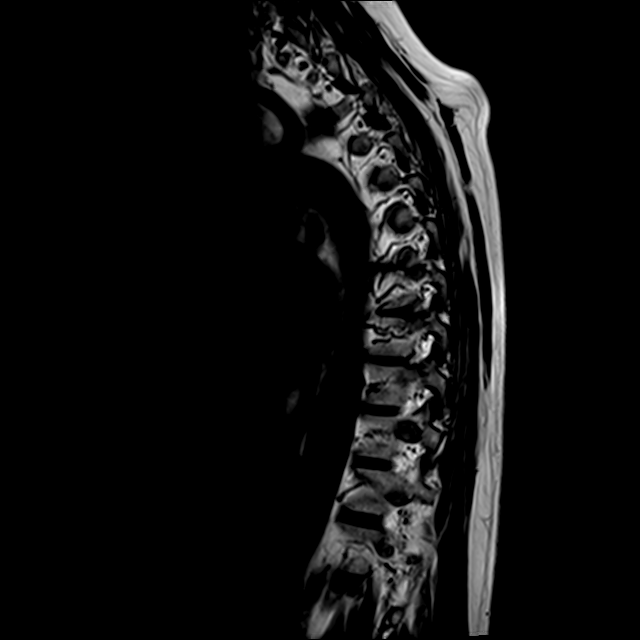
[im 6/12]
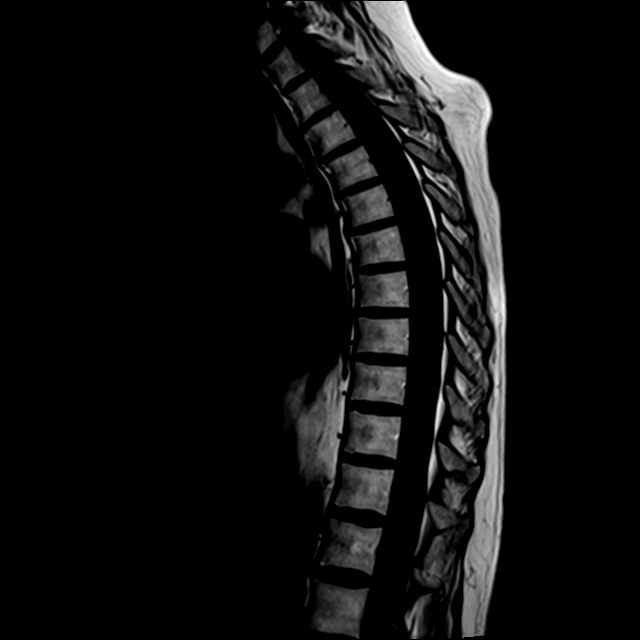
[im 12/12]
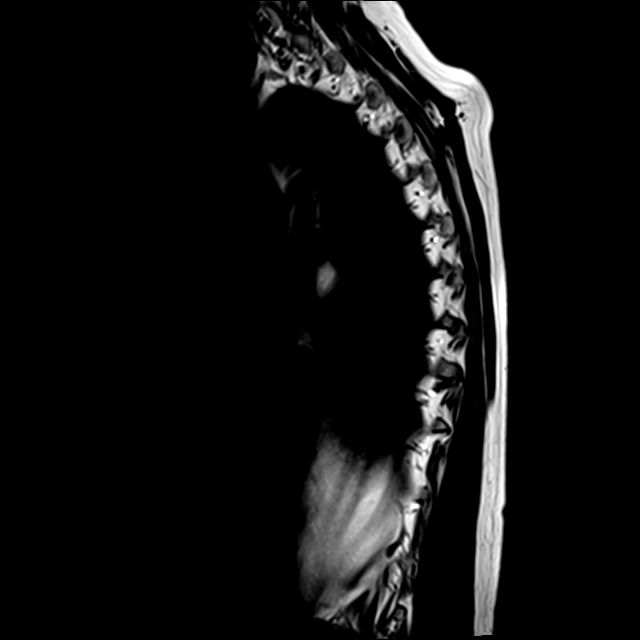

[Series 7: STIR · sagittal · 4.0mm · 0.62mm/px · 3 of 12 slices shown]
[im 1/12]
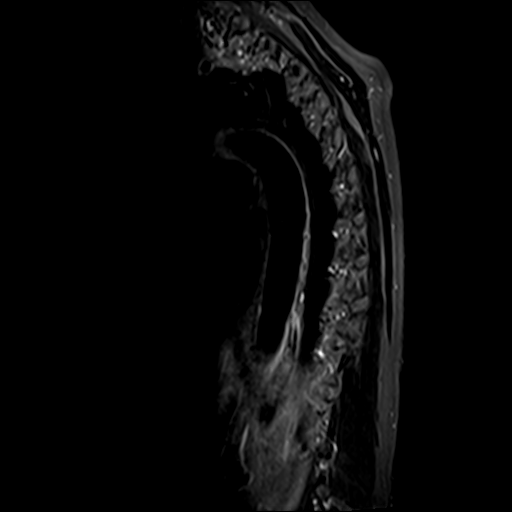
[im 6/12]
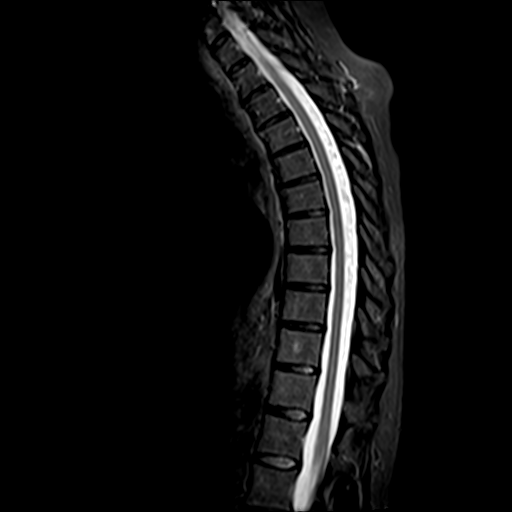
[im 12/12]
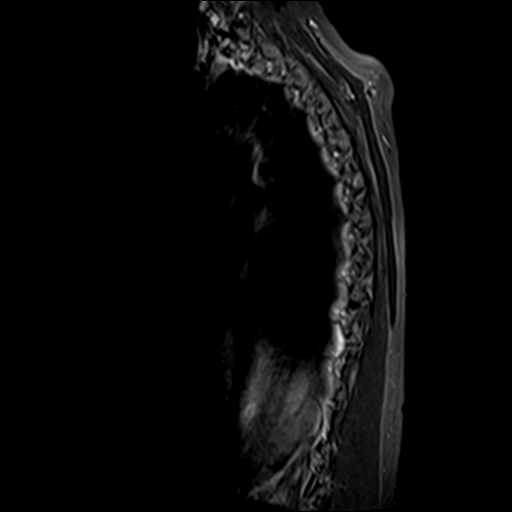

[Series 9: T2 · axial · 4.0mm · 0.74mm/px · z∈[-291,-92]mm · 9 of 39 slices shown (2 of 2)]
[im 1/39]
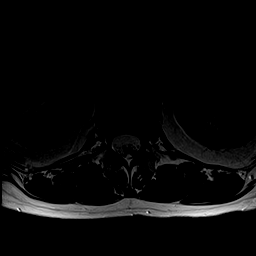
[im 6/39]
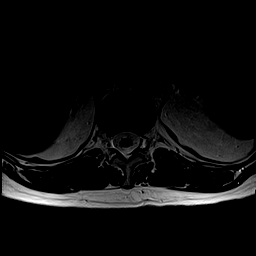
[im 11/39]
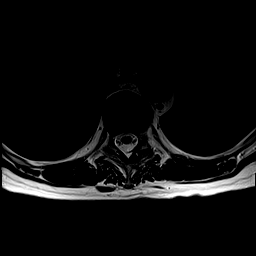
[im 17/39]
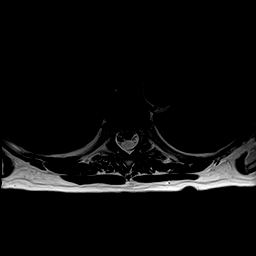
[im 20/39]
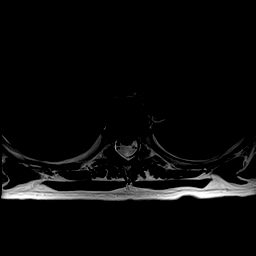
[im 22/39]
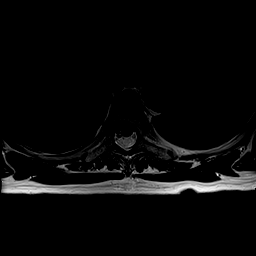
[im 28/39]
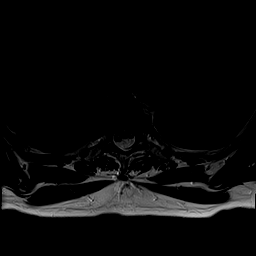
[im 33/39]
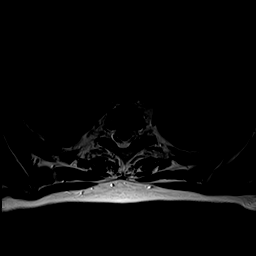
[im 39/39]
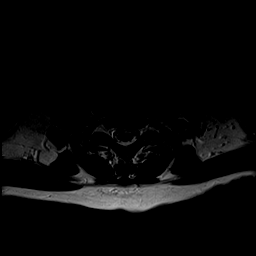

[21 of 48 positions shown; findings below may reference images not displayed]

FINDINGS: ALIGNMENT: Maintenance of the thoracic kyphosis. No malalignment.

VERTEBRAE/DISCS: Vertebral bodies are intact. Intervertebral discs
morphology and signal are normal. Minimal upper thoracic chronic
discogenic endplate changes. 10 mm T10 hemangioma. No suspicious or
acute bone marrow signal. Capacious congenital canal.

CORD: Thoracic spinal cord is normal morphology and signal
characteristics to the level of the conus medullaris which
terminates at T12-L1.

PREVERTEBRAL AND PARASPINAL SOFT TISSUES:  Normal.

DISC LEVELS:

No disc bulge, canal stenosis nor neural foraminal narrowing.
IMPRESSION: 1. No acute osseous process.
2. Early degenerative changes thoracic spine without canal stenosis
or neural foraminal narrowing.

## 2020-10-26 DIAGNOSIS — M5412 Radiculopathy, cervical region: Secondary | ICD-10-CM | POA: Diagnosis not present

## 2020-11-02 ENCOUNTER — Ambulatory Visit: Payer: Medicare HMO | Admitting: Internal Medicine

## 2020-11-05 DIAGNOSIS — M5412 Radiculopathy, cervical region: Secondary | ICD-10-CM | POA: Diagnosis not present

## 2020-11-11 ENCOUNTER — Other Ambulatory Visit: Payer: Self-pay | Admitting: Neurological Surgery

## 2020-11-11 DIAGNOSIS — M5412 Radiculopathy, cervical region: Secondary | ICD-10-CM

## 2020-11-19 ENCOUNTER — Other Ambulatory Visit: Payer: Self-pay

## 2020-11-19 ENCOUNTER — Ambulatory Visit (INDEPENDENT_AMBULATORY_CARE_PROVIDER_SITE_OTHER): Payer: Medicare HMO | Admitting: Internal Medicine

## 2020-11-19 ENCOUNTER — Encounter: Payer: Self-pay | Admitting: Internal Medicine

## 2020-11-19 DIAGNOSIS — H6122 Impacted cerumen, left ear: Secondary | ICD-10-CM | POA: Diagnosis not present

## 2020-11-19 DIAGNOSIS — R918 Other nonspecific abnormal finding of lung field: Secondary | ICD-10-CM | POA: Diagnosis not present

## 2020-11-19 DIAGNOSIS — R0609 Other forms of dyspnea: Secondary | ICD-10-CM

## 2020-11-19 DIAGNOSIS — R252 Cramp and spasm: Secondary | ICD-10-CM | POA: Diagnosis not present

## 2020-11-19 DIAGNOSIS — R2689 Other abnormalities of gait and mobility: Secondary | ICD-10-CM | POA: Diagnosis not present

## 2020-11-19 DIAGNOSIS — R058 Other specified cough: Secondary | ICD-10-CM

## 2020-11-19 DIAGNOSIS — R0600 Dyspnea, unspecified: Secondary | ICD-10-CM | POA: Diagnosis not present

## 2020-11-19 MED ORDER — GABAPENTIN 300 MG PO CAPS: ORAL_CAPSULE | ORAL | 2 refills | Status: DC

## 2020-11-19 MED ORDER — GABAPENTIN 100 MG PO CAPS: 100.0000 mg | ORAL_CAPSULE | Freq: Four times a day (QID) | ORAL | 2 refills | Status: DC

## 2020-11-19 NOTE — Assessment & Plan Note (Signed)
Onset early adulthood  - 06/08/2015  Walked RA x 3 laps @ 185 ft each stopped due to End of study, fast pace, min sob no desat    -  03/18/2019   Walked RA  2 laps @ approx 260ft each @ fast pace  stopped due to end of study, no desats, min sob  -  11/19/2020   Walked RA  2 laps @ approx 244ft each @ fast pace  stopped due to end of study, min sob with sats 99%   No evidence of a pulmonary problem based on ct chest and today exam and walking sats

## 2020-11-19 NOTE — Assessment & Plan Note (Signed)
Never smoker CT chest   05/22/15  mpns x 4 mm  CT chest 05/30/2016 1.An incidental finding of potential clinical significance has been found. RIGHT upper lobe round pulmonary nodule (7 mm) along the pleural surface. This nodule was not imaged on comparison cardiac CT. Non-contrast chest CT at 6-12 months is recommended. > repeat in 12 months (tickle file )  2. Stable RIGHT lower lobe and RIGHT upper lobe nodules compared to comparison CT 05/22/2015 consistent benign etiology - CT chest 05/31/2017 Pulmonary nodules measure up to 7 mm, stable from 05/30/2016. Additional follow-up CT chest without contrast in 1 year is Recommended> done 06/01/18 : Stable small right lung nodules measuring up to 7 mm, unchanged, benign. Greater than 2 year stability has been demonstrated dedicated follow-up imaging is not required - CT chest  Chest CT coronary 10/10/2018 1. New right hilar lymphadenopathy measuring up to 1.7 cm in short axis. This is of uncertain etiology and significance, but follow-up contrast enhanced chest CT is recommended in 3 months to ensure the resolution of this finding and to fully evaluate the lungs. 2. New nodular area of architectural distortion measuring 9 mm in the inferior segment of the lingula.   - CT chest 03/06/2019 Mediastinal lymph nodes measure up to 11 mm in the low right paratracheal station. Right hilar lymph node measures 1.4 cm. Findings are unchanged from multiple prior exams Biapical pleuroparenchymal scarring. 8 mm high density granuloma in the anterior right upper lobe, as before. 3 mm subpleural right upper lobe nodule (series 3, image 62), also Unchanged -07/10/20 CT w/o contrast 1. No explanation for RIGHT-sided chest pain. 2. No change in RIGHT lung pulmonary nodules. 3. Reduction in size of partially calcified mediastinal andRIGHT hilar lymph nodes. Findings suggest prior/chronic granulomatous Disease  >>>  No directed f/u needed

## 2020-11-19 NOTE — Patient Instructions (Addendum)
Try prilosec(omeprazole)  otc 40  Take 30-60 min before first meal of the day and Pepcid ac (famotidine) 20 mg one an hour or so before bedtime   Gabapentin 100 mg build up to 4 x daily  And if not better then start new rx for gabapentin 300 mg twice daily and gradually build up to 4 x daily or whatever the lowest dose is that works  GERD (REFLUX)  is an extremely common cause of respiratory symptoms just like yours , many times with no obvious heartburn at all.    It can be treated with medication, but also with lifestyle changes including elevation of the head of your bed (ideally with 6 -8inch blocks under the headboard of your bed),  Smoking cessation, avoidance of late meals, excessive alcohol, and avoid fatty foods, chocolate, peppermint, colas, red wine, and acidic juices such as orange juice.  NO MINT OR MENTHOL PRODUCTS SO NO COUGH DROPS  USE SUGARLESS CANDY INSTEAD (Jolley ranchers or Stover's or Life Savers) or even ice chips will also do - the key is to swallow to prevent all throat clearing. NO OIL BASED VITAMINS - use powdered substitutes.  Avoid fish oil when coughing.    To get the most out of exercise, you need to be continuously aware that you are short of breath, but never out of breath, for 30 minutes daily. As you improve, it will actually be easier for you to do the same amount of exercise  in  30 minutes so always push to the level where you are short of breath.   Please schedule a follow up office visit in 6 weeks, call sooner if needed

## 2020-11-19 NOTE — Assessment & Plan Note (Signed)
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  The other paranasal sinuses appear normal. - 11/20/2018 restarted singulair/ gabapentin rx as intol of narcs ("low pulse") > cough  improved as of 01/31/2019  - Covid 19 Antibodies 03/18/2019 = neg  - 06/27/2019 tapering off gabapenitin > f/u prn flare - 11/19/2020 resumed gabapentin for flare since 04/2020   Of the three most common causes of  Sub-acute / recurrent or chronic cough, only one (GERD)  can actually contribute to/ trigger  the other two (asthma and post nasal drip syndrome)  and perpetuate the cylce of cough.  While not intuitively obvious, many patients with chronic low grade reflux do not cough until there is a primary insult that disturbs the protective epithelial barrier and exposes sensitive nerve endings.   This is typically viral but can due to PNDS and  either may apply here.   The point is that once this occurs, it is difficult to eliminate the cycle  using anything but a maximally effective acid suppression regimen at least in the short run, accompanied by an appropriate diet to address non acid GERD and control / eliminate the cough which is likely aggravating mscp with gabapentin titrate up to max of 300 mg qid or whatever the lowest dose that works turns out to be.            Each maintenance medication was reviewed in detail including emphasizing most importantly the difference between maintenance and prns and under what circumstances the prns are to be triggered using an action plan format where appropriate.  Total time for H and P, chart review, counseling,  directly observing portions of ambulatory 02 saturation study/ and generating customized AVS unique to this  acute office visit / same day charting = 49 min

## 2020-11-19 NOTE — Progress Notes (Signed)
Subjective:    Patient ID: Tracey Henson, female    DOB: [birth year],     MRN: [id number]    Brief patient profile:  2 yowf never smoker with h/o seasonal rhinitis  X 1980s then intermittent cough mid 90s not correlating with rhinitis maybe once or twice a week esp hs then doe progressive x 2016 and can't do fast pace walking or hills due to sob > cardiac eval > CT heart > mpns > referred 06/08/2015 by Dr Nancy Fetter to pulmonary clinic.     History of Present Illness  06/08/2015 1st [facility] office visit/ Tracey Henson   Chief Complaint  Patient presents with  . Pulmonary Consult    Referred by Dr. Nancy Fetter for pulmonary nodules. Pt states that she has had DOE for "as long as I can remember"- gets out of breath pushing a lawn mower. She has had CP and cough that come and go.    onset of cough was indolent x 4 y prior to OV  assoc with sense of pnds daily and at hs but no excess mucus, then midline cp with severe coughing fits that occur once or twice a week assoc with doe and variable HB rec Try prilosec otc 20mg   Take 30-60 min before first meal of the day and Pepcid ac (famotidine) 20 mg one @  bedtime  Until return  For drainage take chlortrimeton (chlorpheniramine) 4 mg every 4 hours available over the counter (may cause drowsiness)  GERD diet  If still coughing you will need tessilon 200 mg up to 4 hours as needed    07/20/2015  f/u ov/Tracey Henson re: uacs / mpns Chief Complaint  Patient presents with  . Follow-up    Pt states her breathing is unchanged.  She is still coughing and constantly clearing her throat.   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 Add singulair 10 mg each pm for now For drainage /  throat tickle try take CHLORPHENIRAMINE  4 mg - take one every 4 hours as needed - available over the counter- may cause drowsiness so start with just a bedtime dose or two and see how you tolerate it before trying in daytime   (could take 2 at bedtime)  Prednisone 10 mg take  4 each am x 2 days,   2 each am x 2 days,  1 each am x 2 days and stop  Stay on acid suppression until cough is gone for at least 2 weeks without the need for cough meds   08/18/2015  f/u ov/Tracey Henson re: cough better on prilosec /singulair / antihistamines Chief Complaint  Patient presents with  . Follow-up    Pt states her cough has improved some. She is not clearing her throat as often. No new co's today.   breathing not an issue as long as doesn't push too hard  Noted more throat clearing when missed a dose of ppi Overall much better vs baseline  rec No change in meds x 3 months  If doing great > try pepcid 20 mg after bfast and supper  Please schedule a follow up visit in 3 months but call  sooner if needed> did not return as rec      11/20/2018  "New pt eval" Tracey Henson/ / does not remember whether h2 helped so now maint prilosec 1 h ac only 20 mg Chief Complaint  Patient presents with  . Pulmonary Consult    Rerferred by Dr Marlou Porch for pulmonary nodules. Pt c/o SOB and non prod cough x 7 wks. She states she gets SOB walking room to room.  She is sleeping propped up due to SOB when she lies down flat.  Dyspnea:  Sob and cough abruptly  started about the same time acute s obvious trigger/ sev rounds abx and pred s help now persistent day and noct  Cough: 24/7 dry mostly early on insp worse with  cold air /perfumes  Sleeping: can't sleep even sitting up due to cough / can't take any narcotics "my heart rate drops real low"  SABA use: none 02:  None  gen ant chest discomfort with coughing fits  rec Prednisone 10 mg take  4 each am x 2 days,   2 each am x 2 days,  1 each am x 2 days and stop  Gabapentin 100 mg three times  daily  Restart the singulair 10 mg daily  For drainage / throat tickle try take CHLORPHENIRAMINE  4 mg  Pantoprazole (protonix) 40 mg   Take  30-60 min before first meal of the day and prilosec 30 min before the last  and Pepcid (famotidine)  20 mg one @  bedtime  GERD diet      televist 01/31/19 No change in medications until return  Weight control is simply a matter of calorie balance    If not better will schedule CPST on return  Pt has my chart but needs set up for office in 6 weeks with all meds     03/18/2019  f/u ov/Tracey Henson re:  Chest tight x 5 years daily symptoms  Chief Complaint  Patient presents with  . Follow-up    Cough is some better since the last visit, but not resolved.   Dyspnea:  MMRC3 = can't walk 100 yards even at a slow pace at a flat grade s stopping due to sob   Cough: better ? Did she have  covid as exp [place] jan 2020 and sick on way back home Sleeping: no resp symptoms flat SABA use: none 02: none  Better with singulair  rec To get the most out of exercise, you need to be continuously aware that you are short of breath, but never out of breath, for 30 minutes daily. As you improve, it will actually be easier for you to do the same amount of exercise  in  30 minutes so always push to the level where you are short of breath.    Please remember to go to the lab department   for your tests - we will call you with the results when they are available.   06/27/2019  f/u ov/Tracey Henson re: unexplained doe x adult life  / chest tightness not related to ex / skains eval 2012  Chief Complaint  Patient presents with  . Follow-up    Pt c/o increased SOB and chest tightness. She states her chest feels full.   Dyspnea:  Ever since adult has noted doe but just with hills or even slow jog Can do rapid walk all day  Cough: much better  Sleeping: ok but immediately aware of chest tightness once awake, does not however disturb  sleep SABA use: none, didn't worse 02: none   Stopping gabapentin over next several weeks for neck pain  rec Work on building up to 30 min short of breath but never out of breath daily  We will set you up for full pfts next available then refer CPST once this available (no office visit is needed) Ct with contrast will need to be scheduled also  > neither done as rec    07/10/20 CT w/o contrast 1. No explanation for RIGHT-sided chest pain. 2. No change in RIGHT lung pulmonary nodules. 3. Reduction in size of partially calcified mediastinal andRIGHT hilar lymph nodes. Findings suggest prior/chronic granulomatous disease.   11/19/2020  Acute ext ov/Tracey Henson re: worse cough / cp since  04/2020 and son passed 05/2020 but says lifelong doe/ chest tightness never improved  Chief Complaint  Patient presents with  . Acute Visit    Breathing has worsened since the last visit. She states "out of breath all time time". She has coughing- non prod and wheezing at hs.. She c/o right side pain for the past year- "deep"- and constantly bothers her.   Dyspnea:  1.8 miles one day prior to OV  X 32 min  Cough: more after supper / non productive ? Worse off gabapentin Sleeping: bed is flat prefer L side down as hurts on R chest wall if lie on it  SABA use: none 02: none  Covid status:   vax x 3  New  R lateral cp is positional eg lying on r side  s trauma or rash ? Cough exac came first    No obvious day to day or daytime variability or assoc excess/ purulent sputum or mucus plugs or hemoptysis   or overt sinus or hb symptoms.     Also denies any obvious fluctuation of symptoms with weather or environmental changes or other aggravating or alleviating factors except as outlined above   No unusual exposure hx or h/o childhood pna/ asthma or knowledge of premature birth.  Current Allergies, Complete Past Medical History, Past Surgical History, Family History, and Social History were reviewed in [employer] record.  ROS  The  following are not active complaints unless bolded Hoarseness, sore throat, dysphagia, dental problems, itching, sneezing,  nasal congestion or discharge of excess mucus or purulent secretions, ear ache,   fever, chills, sweats, unintended wt loss or wt gain, classically pleuritic or exertional cp,  orthopnea pnd or arm/hand swelling  or leg swelling, presyncope, palpitations, abdominal pain, anorexia, nausea, vomiting, diarrhea  or change in bowel habits or change in bladder habits, change in stools or change in urine, dysuria, hematuria,  rash, arthralgias, visual complaints, headache, numbness, weakness or ataxia or problems with walking or coordination,  change in mood or  memory.        Current Meds  Medication Sig  . Biotin 5000 MCG CAPS Take 1 capsule by mouth in the morning and at bedtime.  . cyanocobalamin 1000 MCG tablet Take 1,000 mcg by mouth daily.  . cycloSPORINE (RESTASIS) 0.05 % ophthalmic emulsion Place 1 drop into both eyes 2 (two) times daily.  . famotidine (PEPCID) 20 MG tablet TAKE 1 TABLET AT BEDTIME  . montelukast (SINGULAIR) 10 MG tablet TAKE 1 TABLET BY MOUTH EVERYDAY AT BEDTIME  . omeprazole (PRILOSEC) 40 MG capsule Take 40 mg by mouth daily.  . pravastatin (PRAVACHOL) 80 MG tablet Take 1 tablet (80 mg total) by mouth daily. Call and schedule follow up office visit  to continue refills. Thank you. [phone]  . UNABLE TO FIND Med Name: elderberry/zinc twice daily  . valACYclovir (VALTREX) 500 MG tablet Take 500 mg by mouth every other day.                      Objective:   Physical Exam      11/19/2020     160 06/27/2019     156  03/18/2019     155  07/20/2015         139 >  08/18/2015 142 > 11/20/2018   155    06/08/15 140 lb (63.504 kg)  05/22/15 140 lb (63.504 kg)  07/04/14 138 lb (62.596 kg)    Vital signs reviewed  11/19/2020  - Note at rest 02 sats  99% on RA   General appearance:    Healthy but anxious amb wf nad     HEENT : pt wearing mask not  removed for exam due to covid -19 concerns.    NECK :  without JVD/Nodes/TM/ nl carotid upstrokes bilaterally   LUNGS: no acc muscle use,  Nl contour chest with no hyperesthesia/ rib instability  which is clear to A and P bilaterally without cough on insp or exp maneuvers   CV:  RRR  no s3 or murmur or increase in P2, and no edema   ABD:  soft and nontender with nl inspiratory excursion in the supine position. No bruits or organomegaly appreciated, bowel sounds nl  MS:  Nl gait/ ext warm without deformities, calf tenderness, cyanosis or clubbing No obvious joint restrictions   SKIN: warm and dry without lesions    NEURO:  alert, approp, nl sensorium with  no motor or cerebellar deficits apparent.                 Assessment & Plan:

## 2020-11-20 DIAGNOSIS — H6123 Impacted cerumen, bilateral: Secondary | ICD-10-CM | POA: Diagnosis not present

## 2020-11-24 DIAGNOSIS — M5412 Radiculopathy, cervical region: Secondary | ICD-10-CM | POA: Diagnosis not present

## 2020-11-24 DIAGNOSIS — M542 Cervicalgia: Secondary | ICD-10-CM | POA: Diagnosis not present

## 2020-11-26 ENCOUNTER — Telehealth: Payer: Self-pay | Admitting: Internal Medicine

## 2020-11-26 NOTE — Telephone Encounter (Signed)
Tracey Henson pt is requesting to know what she is on the gabapentin.  Please advise. thanks

## 2020-11-26 NOTE — Telephone Encounter (Signed)
I have called the pt and made her aware of why she is on the gabapentin.  She is happy with the results.  Nothing further is needed.

## 2020-11-26 NOTE — Telephone Encounter (Signed)
For cough control, works better than narcotic containing meds and not addictive - if not happy with results lets set up at least a televisit to regroup

## 2020-12-03 DIAGNOSIS — M7918 Myalgia, other site: Secondary | ICD-10-CM | POA: Diagnosis not present

## 2020-12-16 ENCOUNTER — Other Ambulatory Visit: Payer: Self-pay | Admitting: Internal Medicine

## 2020-12-16 DIAGNOSIS — R058 Other specified cough: Secondary | ICD-10-CM

## 2020-12-31 DIAGNOSIS — M7918 Myalgia, other site: Secondary | ICD-10-CM | POA: Diagnosis not present

## 2020-12-31 DIAGNOSIS — M898X1 Other specified disorders of bone, shoulder: Secondary | ICD-10-CM | POA: Diagnosis not present

## 2020-12-31 DIAGNOSIS — Z981 Arthrodesis status: Secondary | ICD-10-CM | POA: Diagnosis not present

## 2021-01-25 DIAGNOSIS — E559 Vitamin D deficiency, unspecified: Secondary | ICD-10-CM | POA: Diagnosis not present

## 2021-01-25 DIAGNOSIS — Z79899 Other long term (current) drug therapy: Secondary | ICD-10-CM | POA: Diagnosis not present

## 2021-01-25 DIAGNOSIS — R7989 Other specified abnormal findings of blood chemistry: Secondary | ICD-10-CM | POA: Diagnosis not present

## 2021-01-29 ENCOUNTER — Other Ambulatory Visit: Payer: Self-pay | Admitting: Internal Medicine

## 2021-01-29 ENCOUNTER — Telehealth: Payer: Self-pay | Admitting: Cardiology

## 2021-01-29 NOTE — Telephone Encounter (Signed)
Pt c/o BP issue: STAT if pt c/o blurred vision, one-sided weakness or slurred speech  1. What are your last 5 BP readings? Pt does not have a BP machine  2. Are you having any other symptoms (ex. Dizziness, headache, blurred vision, passed out)?  Chest pain in center but not having any now, left arm from pain but not hurting now  3. What is your BP issue? Pt thinks her BP is running high   162/78 -taken 7 weeks ago at PCP office Pt said it's fine if she doesn't get a return call til Monday

## 2021-02-01 NOTE — Telephone Encounter (Signed)
Returned call to Pt.  Per Pt she has had intermittent heart racing and pounding, she states the pounding has woken her up from sleep.  She states that her chest will feel heavy.  She also states she has had increased fatigue.  She states she had spine surgery in January and has visits every other week and her blood pressure has been "high for her" running 158/75-78.

## 2021-02-02 NOTE — Telephone Encounter (Signed)
Please have her set up with the pharmacy [facility] clinic  Candee Furbish, MD

## 2021-02-03 NOTE — Telephone Encounter (Signed)
Spoke with pt to advised per Dr Marlou Porch she should be seen in the [facility] Clinic here.  Per pt, she has recently obtain a new BP monitor and BP has been better running 135-141/60-70.  She reports she is no longer having any chest pressure and no racing heart.  Pt would like to continue to monitor BP and call back if it starts to elevate again or if she starts having any other s/s.  She is due for 1 yr f/u in August.  Was attempting to schedule that f/u appt but pt's phone was "about to die" and she will call back to schedule.

## 2021-02-22 DIAGNOSIS — Z981 Arthrodesis status: Secondary | ICD-10-CM | POA: Diagnosis not present

## 2021-02-22 DIAGNOSIS — M7918 Myalgia, other site: Secondary | ICD-10-CM | POA: Diagnosis not present

## 2021-02-22 DIAGNOSIS — M898X1 Other specified disorders of bone, shoulder: Secondary | ICD-10-CM | POA: Diagnosis not present

## 2021-03-03 ENCOUNTER — Ambulatory Visit: Payer: Medicare HMO | Admitting: Podiatry

## 2021-03-03 ENCOUNTER — Encounter: Payer: Self-pay | Admitting: Podiatry

## 2021-03-03 ENCOUNTER — Other Ambulatory Visit: Payer: Self-pay

## 2021-03-03 DIAGNOSIS — L600 Ingrowing nail: Secondary | ICD-10-CM

## 2021-03-03 NOTE — Progress Notes (Signed)
Subjective:   Patient ID: Tracey Henson, female   DOB: 70 y.o.   MRN: [id number]   HPI Patient has had previous nail removal and on the lateral side of the right hallux is developed a painful spicule of nail which is irritated and makes it hard for her to wear shoe gear comfortably.  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  Patient wants procedure and today I had her sign consent form and I infiltrated the right hallux 60 mg like Marcaine mixture sterile prep removed the lateral border exposed matrix applied phenol 3 applications 30 seconds followed by alcohol lavage sterile dressing.  Gave instructions on soaks

## 2021-03-03 NOTE — Patient Instructions (Signed)

## 2021-04-01 ENCOUNTER — Other Ambulatory Visit: Payer: Self-pay | Admitting: Cardiology

## 2021-04-13 DIAGNOSIS — J0100 Acute maxillary sinusitis, unspecified: Secondary | ICD-10-CM | POA: Diagnosis not present

## 2021-04-30 DIAGNOSIS — R519 Headache, unspecified: Secondary | ICD-10-CM | POA: Diagnosis not present

## 2021-05-03 DIAGNOSIS — H43813 Vitreous degeneration, bilateral: Secondary | ICD-10-CM | POA: Diagnosis not present

## 2021-05-03 DIAGNOSIS — H538 Other visual disturbances: Secondary | ICD-10-CM | POA: Diagnosis not present

## 2021-05-03 DIAGNOSIS — Z961 Presence of intraocular lens: Secondary | ICD-10-CM | POA: Diagnosis not present

## 2021-05-11 ENCOUNTER — Other Ambulatory Visit: Payer: Self-pay

## 2021-05-11 MED ORDER — OMEPRAZOLE 40 MG PO CPDR: 40.0000 mg | DELAYED_RELEASE_CAPSULE | Freq: Every day | ORAL | 6 refills | Status: DC

## 2021-05-25 DIAGNOSIS — H43813 Vitreous degeneration, bilateral: Secondary | ICD-10-CM | POA: Diagnosis not present

## 2021-05-25 DIAGNOSIS — Z961 Presence of intraocular lens: Secondary | ICD-10-CM | POA: Diagnosis not present

## 2021-05-25 DIAGNOSIS — H04123 Dry eye syndrome of bilateral lacrimal glands: Secondary | ICD-10-CM | POA: Diagnosis not present

## 2021-05-31 DIAGNOSIS — H524 Presbyopia: Secondary | ICD-10-CM | POA: Diagnosis not present

## 2021-06-03 DIAGNOSIS — S30861A Insect bite (nonvenomous) of abdominal wall, initial encounter: Secondary | ICD-10-CM | POA: Diagnosis not present

## 2021-06-03 DIAGNOSIS — W57XXXA Bitten or stung by nonvenomous insect and other nonvenomous arthropods, initial encounter: Secondary | ICD-10-CM | POA: Diagnosis not present

## 2021-06-09 NOTE — Progress Notes (Signed)
Cardiology Office Note:    Date:  06/10/2021   ID:  Tracey Henson, DOB 1951/06/02, MRN [id number]  PCP:  Donald Prose, MD  Cardiologist:  Candee Furbish, MD  Electrophysiologist:  None   Referring MD: Donald Prose, MD     History of Present Illness:    Tracey Henson is a 70 y.o. female here for a follow-up for coronary artery disease and hypertension. Has a strong family history of coronary artery disease father 4 siblings with CAD. CVD 10-year risk score is 8.7% falls in the statin benefit group. On 04/28/20 visit, she stated she had increasing fatigue and occasional heart pounding, chest discomfort and included jaw pain. Also included leg pain when walking and waking up at night with leg cramping in calves.   Today, she reports she experiences a chest pain underneath her left breast that starts from the front to the back. The episodes appear at night when she is laying down. She also reports she experiences palpitations that wakes her up out of her sleep however she lays there until it stops and the episodes last for a few minutes. She states she has calves aches after walking and sits down to relieve. She includes the calves hurt to the point that she doesn't want to get up.  She recently have got a dog that is a plott hound and dachshund mix named Ayesha Rumpf and walks her 2 miles a day with no complications.   She denies any fatigue, LE edema, headaches, vomiting, GI or GU symptoms, lightheadedness, dizziness or syncope.   Past Medical History:  Diagnosis Date   Asthma    seasonal   Baker cyst    left leg   Baker's cyst, left    left leg   Breast cyst    Frequent headaches    GERD (gastroesophageal reflux disease)    Hearing loss    bilateral   Hypercholesteremia    Muscle cramps    Vertigo     Past Surgical History:  Procedure Laterality Date   ABDOMINAL HYSTERECTOMY  1987   APPENDECTOMY  1963   BILATERAL OOPHORECTOMY     BREAST CYST EXCISION Bilateral pt unsure   BREAST  EXCISIONAL BIOPSY Right    x1   BREAST EXCISIONAL BIOPSY Left    x2   BREAST SURGERY  1990,1991   lumpectomy, 3 x R breats, 2 x Left breast   SEPTOPLASTY     TEMPOROMANDIBULAR JOINT SURGERY  1998    Current Medications: Current Meds  Medication Sig   cycloSPORINE (RESTASIS) 0.05 % ophthalmic emulsion Place 1 drop into both eyes 2 (two) times daily.   famotidine (PEPCID) 20 MG tablet TAKE 1 TABLET AT BEDTIME   montelukast (SINGULAIR) 10 MG tablet TAKE 1 TABLET BY MOUTH EVERYDAY AT BEDTIME   omeprazole (PRILOSEC) 40 MG capsule Take 1 capsule (40 mg total) by mouth daily.   pravastatin (PRAVACHOL) 80 MG tablet Take 1 tablet (80 mg total) by mouth daily. Please keep upcoming appt in October 2022 with Dr. Marlou Porch before anymore refills. Thank you   valACYclovir (VALTREX) 500 MG tablet Take 500 mg by mouth every other day.     Allergies:   Cefaclor, Codeine, Crestor [rosuvastatin], and Oxybutynin   Social History   Socioeconomic History   Marital status: Single    Spouse name: Not on file   Number of children: 2   Years of education: assoc degree   Highest education level: Associate degree: academic program  Occupational History  Comment: [employer]  Tobacco Use   Smoking status: Never   Smokeless tobacco: Never  Vaping Use   Vaping Use: Never used  Substance and Sexual Activity   Alcohol use: No    Alcohol/week: 0.0 standard drinks    Comment: Occassional wine   Drug use: Never   Sexual activity: Not on file  Other Topics Concern   Not on file  Social History Narrative   Caffeine- none. Lives home alone.  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  3 children.  Caffein 2-3 cups daily (sometimes)   Social Determinants of Health   Financial Resource Strain: Not on file  Food Insecurity: Not on file  Transportation Needs: Not on file  Physical Activity: Not on file  Stress: Not on file  Social Connections: Not on file     Family  History: The patient's family history includes Cancer in her maternal aunt and paternal grandmother; Coronary artery disease in her father, maternal grandfather, and mother; Heart attack in her mother; Heart attack (age of onset: 63) in her father; Heart attack (age of onset: 76) in her maternal grandfather; Heart disease in her sister; Hypertension in her brother and sister; Lung cancer (age of onset: 26) in her mother; Stroke (age of onset: 7) in her maternal grandmother.  ROS:   Please see the history of present illness.    (+) palpitations (+) calve aches (+) chest pain-underneath left breast  All other systems reviewed and are negative.  EKGs/Labs/Other Studies Reviewed:    The following studies were reviewed today:  Coronary CT 10/10/2018 1. Coronary calcium score of 183. This was 85 percentile for age and sex matched control.   2. Normal coronary origin with right dominance.   3. Mild diffuse non-obstructive CAD. Aggressive risk factor modification is recommended. LDL 106 on 02/22/20    ECHO 06/2017:  Study Conclusions   - Left ventricle: The cavity size was normal. Wall thickness was    normal. Systolic function was normal. The estimated ejection    fraction was in the range of 60% to 65%.  - Mitral valve: There was moderate regurgitation.  - Left atrium: The atrium was moderately dilated.  - Right atrium: The atrium was moderately dilated.   Nuclear stress test 2014:   was low risk but did have upsloping ST segment depression.  EKG: 09/22: Sinus rhythm, rate 62 bpm  04/28/2020: sinus rhythm 62 with no other abnormalities prior as above in HPI nonspecific ST-T wave changes 01/31/2018: nonspecific ST-T wave changes, subtle upsloping noted in the lateral leads.  Recent Labs: No results found for requested labs within last 8760 hours.  Recent Lipid Panel    Component Value Date/Time   CHOL 197 01/07/2019 1016   CHOL 221 (H) 05/20/2015 0814   TRIG 148 01/07/2019 1016    TRIG 88 05/20/2015 0814   HDL 44 01/07/2019 1016   HDL 50 05/20/2015 0814   CHOLHDL 4.5 (H) 01/07/2019 1016   CHOLHDL 3.2 08/25/2015 0746   VLDL 15 08/25/2015 0746   LDLCALC 123 (H) 01/07/2019 1016   LDLCALC 153 (H) 05/20/2015 0814    Physical Exam:    VS:  BP (!) 150/74   Pulse 62   Ht 5\' 4"  (1.626 m)   Wt 149 lb 3.2 oz (67.7 kg)   LMP  (LMP Unknown)   SpO2 97%   BMI 25.61 kg/m     Wt Readings from Last 3 Encounters:  06/10/21 149 lb  3.2 oz (67.7 kg)  11/19/20 160 lb 12.8 oz (72.9 kg)  04/28/20 146 lb 6.4 oz (66.4 kg)     GEN:  Well nourished, well developed in no acute distress HEENT: Normal NECK: No JVD; No carotid bruits LYMPHATICS: No lymphadenopathy CARDIAC: RRR, no murmurs, rubs, gallops RESPIRATORY:  Clear to auscultation without rales, wheezing or rhonchi  ABDOMEN: Soft, non-tender, non-distended MUSCULOSKELETAL:  No edema; No deformity, excellent still pulses SKIN: Warm and dry NEUROLOGIC:  Alert and oriented x 3 PSYCHIATRIC:  Normal affect, mildly anxious   ASSESSMENT:    1. Coronary artery disease involving native coronary artery of native heart without angina pectoris   2. Palpitations   3. Atypical chest pain   4. Aortic atherosclerosis (HCC)   5. Leg cramping     PLAN:    CAD (coronary artery disease) Coronary calcium score was 183, mild diffuse nonobstructive CAD noted on coronary CT scan in 2020.  Continue with risk factor modification which includes pravastatin.  80 mg.  Continue with current medical management.  She had struggled with other cholesterol-lowering agent such as Crestor.  Could consider low-dose aspirin.  Atypical chest pain As described above, she will occasionally have chest discomfort when she is laying down at night radiates to her back.  She frequently walks her dog without any difficulty.  She is doing well.  No exertional anginal symptoms.  Most likely musculoskeletal type discomfort.  No change with food.  Asked her to  perform stretches after her walk.  Reassuring coronary CT in 2020.  Aortic atherosclerosis ([place]) Continue with diet, exercise, statin therapy.  No changes made.  Leg cramping Excellent pedal pulses noted.  No evidence of peripheral arterial disease.  Also asked her to stretch lower legs after her walks.  Palpitations Occasional pounding heart, fast wakes her up at night.  We will go ahead and place a Zio patch monitor to exclude adverse arrhythmia such as atrial fibrillation.  Previously in 2018 Holter monitor showed paroxysmal atrial tachycardia brief episodes as well as PACs and PVCs.  FOLLOW UP IN 1 year  Medication Adjustments/Labs and Tests Ordered: Current medicines are reviewed at length with the patient today.  Concerns regarding medicines are outlined above.  Orders Placed This Encounter  Procedures   LONG TERM MONITOR (3-14 DAYS)   EKG 12-Lead    No orders of the defined types were placed in this encounter.   Patient Instructions  Medication Instructions:  The current medical regimen is effective;  continue present plan and medications.  *If you need a refill on your cardiac medications before your next appointment, please call your pharmacy*  Testing/Procedures: [place] Monitor Instructions  Your physician has requested you wear a ZIO patch monitor for 14 days.  This is a single patch monitor. [employer] supplies one patch monitor per enrollment. Additional stickers are not available. Please do not apply patch if you will be having a Nuclear Stress Test,  Echocardiogram, Cardiac CT, MRI, or Chest Xray during the period you would be wearing the  monitor. The patch cannot be worn during these tests. You cannot remove and re-apply the  ZIO XT patch monitor.  Your ZIO patch monitor will be mailed 3 day USPS to your address on file. It may take 3-5 days  to receive your monitor after you have been enrolled.  Once you have received your monitor, please review the  enclosed instructions. Your monitor  has already been registered assigning a specific monitor serial #  to you.  Billing and Patient Assistance Program Information  We have supplied [employer] with any of your insurance information on file for billing purposes. [employer] offers a sliding scale Patient Assistance Program for patients that do not have  insurance, or whose insurance does not completely cover the cost of the ZIO monitor.  You must apply for the Patient Assistance Program to qualify for this discounted rate.  To apply, please call [employer] at [phone], select option 4, select option 2, ask to apply for  Patient Assistance Program. Theodore Demark will ask your household income, and how many people  are in your household. They will quote your out-of-pocket cost based on that information.  [employer] will also be able to set up a 29-month, interest-free payment plan if needed.  Applying the monitor   Shave hair from upper left chest.  Hold abrader disc by orange tab. Rub abrader in 40 strokes over the upper left chest as  indicated in your monitor instructions.  Clean area with 4 enclosed alcohol pads. Let dry.  Apply patch as indicated in monitor instructions. Patch will be placed under collarbone on left  side of chest with arrow pointing upward.  Rub patch adhesive wings for 2 minutes. Remove white label marked "1". Remove the white  label marked "2". Rub patch adhesive wings for 2 additional minutes.  While looking in a mirror, press and release button in center of patch. A small green light will  flash 3-4 times. This will be your only indicator that the monitor has been turned on.  Do not shower for the first 24 hours. You may shower after the first 24 hours.  Press the button if you feel a symptom. You will hear a small click. Record Date, Time and  Symptom in the Patient Logbook.  When you are ready to remove the patch, follow instructions on the last 2 pages of Patient  Logbook.  Stick patch monitor onto the last page of Patient Logbook.  Place Patient Logbook in the blue and white box. Use locking tab on box and tape box closed  securely. The blue and white box has prepaid postage on it. Please place it in the mailbox as  soon as possible. Your physician should have your test results approximately 7 days after the  monitor has been mailed back to [facility].  Call [facility] at [phone] if you have questions regarding  your ZIO XT patch monitor. Call them immediately if you see an orange light blinking on your  monitor.  If your monitor falls off in less than 4 days, contact our Monitor department at [phone].  If your monitor becomes loose or falls off after 4 days call [employer] at [phone] for  suggestions on securing your monitor  Follow-Up: At [facility], you and your health needs are our priority.  As part of our continuing mission to provide you with exceptional heart care, we have created designated Provider Care Teams.  These Care Teams include your primary Cardiologist (physician) and Advanced Practice Providers (APPs -  Physician Assistants and Nurse Practitioners) who all work together to provide you with the care you need, when you need it.  We recommend signing up for the patient portal called "MyChart".  Sign up information is provided on this After Visit Summary.  MyChart is used to connect with patients for Virtual Visits (Telemedicine).  Patients are able to view lab/test results, encounter notes, upcoming appointments, etc.  Non-urgent messages can be sent to your  provider as well.   To learn more about what you can do with MyChart, go to [email or web address].    Your next appointment:   1 year(s)  The format for your next appointment:   In Person  Provider:   Candee Furbish, MD  Thank you for choosing [facility]!!      I,Jada Bradford,acting as a scribe for Candee Furbish, MD.,have documented  all relevant documentation on the behalf of Candee Furbish, MD,as directed by  Candee Furbish, MD while in the presence of Candee Furbish, MD.  I, Candee Furbish, MD, have reviewed all documentation for this visit. The documentation on 06/10/21 for the exam, diagnosis, procedures, and orders are all accurate and complete.  Signed, Candee Furbish, MD  06/10/2021 8:57 AM    [facility]

## 2021-06-10 ENCOUNTER — Other Ambulatory Visit: Payer: Self-pay

## 2021-06-10 ENCOUNTER — Ambulatory Visit: Payer: Medicare HMO | Admitting: Cardiology

## 2021-06-10 ENCOUNTER — Encounter: Payer: Self-pay | Admitting: Cardiology

## 2021-06-10 ENCOUNTER — Ambulatory Visit (INDEPENDENT_AMBULATORY_CARE_PROVIDER_SITE_OTHER): Payer: Medicare HMO

## 2021-06-10 VITALS — BP 150/74 | HR 62 | Ht 64.0 in | Wt 149.2 lb

## 2021-06-10 DIAGNOSIS — I2510 Atherosclerotic heart disease of native coronary artery without angina pectoris: Secondary | ICD-10-CM | POA: Diagnosis not present

## 2021-06-10 DIAGNOSIS — R002 Palpitations: Secondary | ICD-10-CM | POA: Diagnosis not present

## 2021-06-10 DIAGNOSIS — R252 Cramp and spasm: Secondary | ICD-10-CM

## 2021-06-10 DIAGNOSIS — R0789 Other chest pain: Secondary | ICD-10-CM | POA: Diagnosis not present

## 2021-06-10 DIAGNOSIS — I700 Atherosclerosis of aorta: Secondary | ICD-10-CM

## 2021-06-10 NOTE — Patient Instructions (Signed)
Medication Instructions:  The current medical regimen is effective;  continue present plan and medications.  *If you need a refill on your cardiac medications before your next appointment, please call your pharmacy*  Testing/Procedures: ZIO XT- Long Term Monitor Instructions  Your physician has requested you wear a ZIO patch monitor for 14 days.  This is a single patch monitor. [employer] supplies one patch monitor per enrollment. Additional stickers are not available. Please do not apply patch if you will be having a Nuclear Stress Test,  Echocardiogram, Cardiac CT, MRI, or Chest Xray during the period you would be wearing the  monitor. The patch cannot be worn during these tests. You cannot remove and re-apply the  ZIO XT patch monitor.  Your ZIO patch monitor will be mailed 3 day USPS to your address on file. It may take 3-5 days  to receive your monitor after you have been enrolled.  Once you have received your monitor, please review the enclosed instructions. Your monitor  has already been registered assigning a specific monitor serial # to you.  Billing and Patient Assistance Program Information  We have supplied [employer] with any of your insurance information on file for billing purposes. [employer] offers a sliding scale Patient Assistance Program for patients that do not have  insurance, or whose insurance does not completely cover the cost of the ZIO monitor.  You must apply for the Patient Assistance Program to qualify for this discounted rate.  To apply, please call [employer] at [phone], select option 4, select option 2, ask to apply for  Patient Assistance Program. [employer] will ask your household income, and how many people  are in your household. They will quote your out-of-pocket cost based on that information.  [employer] will also be able to set up a 12-month, interest-free payment plan if needed.  Applying the monitor   Shave hair from upper left chest.  Hold abrader disc  by orange tab. Rub abrader in 40 strokes over the upper left chest as  indicated in your monitor instructions.  Clean area with 4 enclosed alcohol pads. Let dry.  Apply patch as indicated in monitor instructions. Patch will be placed under collarbone on left  side of chest with arrow pointing upward.  Rub patch adhesive wings for 2 minutes. Remove white label marked "1". Remove the white  label marked "2". Rub patch adhesive wings for 2 additional minutes.  While looking in a mirror, press and release button in center of patch. A small green light will  flash 3-4 times. This will be your only indicator that the monitor has been turned on.  Do not shower for the first 24 hours. You may shower after the first 24 hours.  Press the button if you feel a symptom. You will hear a small click. Record Date, Time and  Symptom in the Patient Logbook.  When you are ready to remove the patch, follow instructions on the last 2 pages of Patient  Logbook. Stick patch monitor onto the last page of Patient Logbook.  Place Patient Logbook in the blue and white box. Use locking tab on box and tape box closed  securely. The blue and white box has prepaid postage on it. Please place it in the mailbox as  soon as possible. Your physician should have your test results approximately 7 days after the  monitor has been mailed back to [employer].  Call [employer] Customer Care at [phone] if you have questions regarding  your ZIO XT patch   monitor. Call them immediately if you see an orange light blinking on your  monitor.  If your monitor falls off in less than 4 days, contact our Monitor department at [phone].  If your monitor becomes loose or falls off after 4 days call [employer] at [phone] for  suggestions on securing your monitor  Follow-Up: At [facility], you and your health needs are our priority.  As part of our continuing mission to provide you with exceptional heart care, we have  created designated Provider Care Teams.  These Care Teams include your primary Cardiologist (physician) and Advanced Practice Providers (APPs -  Physician Assistants and Nurse Practitioners) who all work together to provide you with the care you need, when you need it.  We recommend signing up for the patient portal called "MyChart".  Sign up information is provided on this After Visit Summary.  MyChart is used to connect with patients for Virtual Visits (Telemedicine).  Patients are able to view lab/test results, encounter notes, upcoming appointments, etc.  Non-urgent messages can be sent to your provider as well.   To learn more about what you can do with MyChart, go to [email or web address].    Your next appointment:   1 year(s)  The format for your next appointment:   In Person  Provider:   Mark Skains, MD     Thank you for choosing [facility]!!    

## 2021-06-10 NOTE — Progress Notes (Unsigned)
Enrolled patient for a 14 day Zio XT  monitor to be mailed to patients home  °

## 2021-06-10 NOTE — Assessment & Plan Note (Signed)
Excellent pedal pulses noted.  No evidence of peripheral arterial disease.  Also asked her to stretch lower legs after her walks.

## 2021-06-10 NOTE — Assessment & Plan Note (Signed)
As described above, she will occasionally have chest discomfort when she is laying down at night radiates to her back.  She frequently walks her dog without any difficulty.  She is doing well.  No exertional anginal symptoms.  Most likely musculoskeletal type discomfort.  No change with food.  Asked her to perform stretches after her walk.  Reassuring coronary CT in 2020.

## 2021-06-10 NOTE — Assessment & Plan Note (Signed)
Occasional pounding heart, fast wakes her up at night.  We will go ahead and place a Zio patch monitor to exclude adverse arrhythmia such as atrial fibrillation.  Previously in 2018 Holter monitor showed paroxysmal atrial tachycardia brief episodes as well as PACs and PVCs.

## 2021-06-10 NOTE — Assessment & Plan Note (Signed)
Coronary calcium score was 183, mild diffuse nonobstructive CAD noted on coronary CT scan in 2020.  Continue with risk factor modification which includes pravastatin.  80 mg.  Continue with current medical management.  She had struggled with other cholesterol-lowering agent such as Crestor.  Could consider low-dose aspirin.

## 2021-06-10 NOTE — Assessment & Plan Note (Signed)
Continue with diet, exercise, statin therapy.  No changes made.

## 2021-06-13 DIAGNOSIS — R002 Palpitations: Secondary | ICD-10-CM | POA: Diagnosis not present

## 2021-06-13 DIAGNOSIS — I2510 Atherosclerotic heart disease of native coronary artery without angina pectoris: Secondary | ICD-10-CM

## 2021-06-14 DIAGNOSIS — H00011 Hordeolum externum right upper eyelid: Secondary | ICD-10-CM | POA: Diagnosis not present

## 2021-06-16 DIAGNOSIS — Z0100 Encounter for examination of eyes and vision without abnormal findings: Secondary | ICD-10-CM | POA: Diagnosis not present

## 2021-07-01 ENCOUNTER — Other Ambulatory Visit: Payer: Self-pay | Admitting: Family Medicine

## 2021-07-01 DIAGNOSIS — I2510 Atherosclerotic heart disease of native coronary artery without angina pectoris: Secondary | ICD-10-CM | POA: Diagnosis not present

## 2021-07-01 DIAGNOSIS — R002 Palpitations: Secondary | ICD-10-CM | POA: Diagnosis not present

## 2021-07-06 ENCOUNTER — Ambulatory Visit: Payer: Medicare HMO | Admitting: Cardiology

## 2021-07-06 ENCOUNTER — Other Ambulatory Visit: Payer: Self-pay | Admitting: Family Medicine

## 2021-07-06 DIAGNOSIS — Z1231 Encounter for screening mammogram for malignant neoplasm of breast: Secondary | ICD-10-CM

## 2021-08-06 ENCOUNTER — Ambulatory Visit
Admission: RE | Admit: 2021-08-06 | Discharge: 2021-08-06 | Disposition: A | Discharge status: Home / Self Care | Payer: Medicare HMO | Source: Ambulatory Visit | Attending: Family Medicine | Admitting: Family Medicine

## 2021-08-06 ENCOUNTER — Other Ambulatory Visit: Payer: Self-pay

## 2021-08-06 DIAGNOSIS — Z1231 Encounter for screening mammogram for malignant neoplasm of breast: Secondary | ICD-10-CM

## 2021-08-19 ENCOUNTER — Other Ambulatory Visit: Payer: Self-pay | Admitting: Cardiology

## 2021-10-06 DIAGNOSIS — M8588 Other specified disorders of bone density and structure, other site: Secondary | ICD-10-CM | POA: Diagnosis not present

## 2021-10-06 DIAGNOSIS — E785 Hyperlipidemia, unspecified: Secondary | ICD-10-CM | POA: Diagnosis not present

## 2021-10-06 DIAGNOSIS — I700 Atherosclerosis of aorta: Secondary | ICD-10-CM | POA: Diagnosis not present

## 2021-10-06 DIAGNOSIS — Z0000 Encounter for general adult medical examination without abnormal findings: Secondary | ICD-10-CM | POA: Diagnosis not present

## 2021-10-06 DIAGNOSIS — K219 Gastro-esophageal reflux disease without esophagitis: Secondary | ICD-10-CM | POA: Diagnosis not present

## 2021-10-06 DIAGNOSIS — Z1389 Encounter for screening for other disorder: Secondary | ICD-10-CM | POA: Diagnosis not present

## 2021-10-06 DIAGNOSIS — A6004 Herpesviral vulvovaginitis: Secondary | ICD-10-CM | POA: Diagnosis not present

## 2021-10-06 DIAGNOSIS — N393 Stress incontinence (female) (male): Secondary | ICD-10-CM | POA: Diagnosis not present

## 2021-10-11 ENCOUNTER — Other Ambulatory Visit: Payer: Self-pay | Admitting: Internal Medicine

## 2021-10-11 DIAGNOSIS — R058 Other specified cough: Secondary | ICD-10-CM

## 2021-10-18 DIAGNOSIS — N393 Stress incontinence (female) (male): Secondary | ICD-10-CM | POA: Diagnosis not present

## 2021-10-18 DIAGNOSIS — R252 Cramp and spasm: Secondary | ICD-10-CM | POA: Diagnosis not present

## 2021-10-18 DIAGNOSIS — E559 Vitamin D deficiency, unspecified: Secondary | ICD-10-CM | POA: Diagnosis not present

## 2021-10-18 DIAGNOSIS — E785 Hyperlipidemia, unspecified: Secondary | ICD-10-CM | POA: Diagnosis not present

## 2021-10-18 DIAGNOSIS — G43909 Migraine, unspecified, not intractable, without status migrainosus: Secondary | ICD-10-CM | POA: Diagnosis not present

## 2021-11-05 DIAGNOSIS — R1032 Left lower quadrant pain: Secondary | ICD-10-CM | POA: Diagnosis not present

## 2021-11-05 DIAGNOSIS — R32 Unspecified urinary incontinence: Secondary | ICD-10-CM | POA: Diagnosis not present

## 2021-11-05 DIAGNOSIS — N8110 Cystocele, unspecified: Secondary | ICD-10-CM | POA: Diagnosis not present

## 2021-11-05 DIAGNOSIS — N393 Stress incontinence (female) (male): Secondary | ICD-10-CM | POA: Diagnosis not present

## 2021-11-08 ENCOUNTER — Other Ambulatory Visit (HOSPITAL_COMMUNITY): Payer: Self-pay | Admitting: Neurological Surgery

## 2021-11-08 DIAGNOSIS — M5412 Radiculopathy, cervical region: Secondary | ICD-10-CM

## 2021-11-12 ENCOUNTER — Emergency Department (HOSPITAL_BASED_OUTPATIENT_CLINIC_OR_DEPARTMENT_OTHER)
Admission: EM | Admit: 2021-11-12 | Discharge: 2021-11-12 | Disposition: A | Discharge status: Home / Self Care | Payer: Medicare HMO | Attending: Emergency Medicine | Admitting: Emergency Medicine

## 2021-11-12 ENCOUNTER — Encounter (HOSPITAL_BASED_OUTPATIENT_CLINIC_OR_DEPARTMENT_OTHER): Payer: Self-pay

## 2021-11-12 ENCOUNTER — Other Ambulatory Visit: Payer: Self-pay

## 2021-11-12 ENCOUNTER — Emergency Department (HOSPITAL_BASED_OUTPATIENT_CLINIC_OR_DEPARTMENT_OTHER): Payer: Medicare HMO

## 2021-11-12 DIAGNOSIS — I700 Atherosclerosis of aorta: Secondary | ICD-10-CM | POA: Diagnosis not present

## 2021-11-12 DIAGNOSIS — R109 Unspecified abdominal pain: Secondary | ICD-10-CM | POA: Diagnosis not present

## 2021-11-12 DIAGNOSIS — R1032 Left lower quadrant pain: Secondary | ICD-10-CM | POA: Diagnosis not present

## 2021-11-12 DIAGNOSIS — K6289 Other specified diseases of anus and rectum: Secondary | ICD-10-CM | POA: Diagnosis not present

## 2021-11-12 LAB — URINALYSIS, ROUTINE W REFLEX MICROSCOPIC
Bilirubin Urine: NEGATIVE
Glucose, UA: NEGATIVE mg/dL
Hgb urine dipstick: NEGATIVE
Ketones, ur: NEGATIVE mg/dL
Leukocytes,Ua: NEGATIVE
Nitrite: NEGATIVE
Protein, ur: NEGATIVE mg/dL
Specific Gravity, Urine: 1.025 (ref 1.005–1.030)
pH: 5.5 (ref 5.0–8.0)

## 2021-11-12 LAB — CBC
HCT: 41.2 % (ref 36.0–46.0)
Hemoglobin: 13.3 g/dL (ref 12.0–15.0)
MCH: 28.2 pg (ref 26.0–34.0)
MCHC: 32.3 g/dL (ref 30.0–36.0)
MCV: 87.3 fL (ref 80.0–100.0)
Platelets: 226 10*3/uL (ref 150–400)
RBC: 4.72 MIL/uL (ref 3.87–5.11)
RDW: 13.7 % (ref 11.5–15.5)
WBC: 7.9 10*3/uL (ref 4.0–10.5)
nRBC: 0 % (ref 0.0–0.2)

## 2021-11-12 LAB — COMPREHENSIVE METABOLIC PANEL
ALT: 32 U/L (ref 0–44)
AST: 27 U/L (ref 15–41)
Albumin: 4.3 g/dL (ref 3.5–5.0)
Alkaline Phosphatase: 61 U/L (ref 38–126)
Anion gap: 8 (ref 5–15)
BUN: 17 mg/dL (ref 8–23)
CO2: 26 mmol/L (ref 22–32)
Calcium: 9.4 mg/dL (ref 8.9–10.3)
Chloride: 106 mmol/L (ref 98–111)
Creatinine, Ser: 0.89 mg/dL (ref 0.44–1.00)
GFR, Estimated: >60 mL/min (ref >60)
Glucose, Bld: 127 mg/dL — ABNORMAL HIGH (ref 70–99)
Potassium: 3.6 mmol/L (ref 3.5–5.1)
Sodium: 140 mmol/L (ref 135–145)
Total Bilirubin: 0.8 mg/dL (ref 0.3–1.2)
Total Protein: 7.4 g/dL (ref 6.5–8.1)

## 2021-11-12 LAB — LIPASE, BLOOD: Lipase: 36 U/L (ref 11–51)

## 2021-11-12 MED ORDER — NAPROXEN 375 MG PO TABS
375.0000 mg | ORAL_TABLET | Freq: Two times a day (BID) | ORAL | 0 refills | Status: DC
Start: 2021-11-12 — End: 2022-03-17

## 2021-11-12 MED ORDER — KETOROLAC TROMETHAMINE 30 MG/ML IJ SOLN
30.0000 mg | Freq: Once | INTRAMUSCULAR | Status: AC
  Administered 2021-11-12: 30 mg via INTRAVENOUS
  Filled 2021-11-12: qty 1

## 2021-11-12 MED ORDER — IOHEXOL 300 MG/ML  SOLN
100.0000 mL | Freq: Once | INTRAMUSCULAR | Status: AC | PRN
  Administered 2021-11-12: 100 mL via INTRAVENOUS

## 2021-11-12 MED ORDER — TRAMADOL HCL 50 MG PO TABS: 50.0000 mg | ORAL_TABLET | Freq: Two times a day (BID) | ORAL | 0 refills | Status: AC | PRN

## 2021-11-12 NOTE — ED Triage Notes (Signed)
Pt c/o LLQ pain, rectal pain x 4 days-states she was dx with "dropped bladder and is scheduled for surgery"-was sent by PCP office-NAD-steady gait

## 2021-11-12 NOTE — ED Provider Notes (Signed)
[facility] EMERGENCY DEPARTMENT Provider Note   CSN: [id number] Arrival date & time: 11/12/21  1330     History  Chief Complaint  Patient presents with   Abdominal Pain    Tracey Henson is a 71 y.o. female.  HPI  71 year old female with past medical history of HTN, HLD presents to the emergency department with concern for left lower quadrant and rectal pain.  Patient has been dealing with suprapubic discomfort for the past couple weeks.  Seen by urology, scheduled to have sling placement for bladder control.  She states after this appointment she developed more specifically left lower quadrant pain and rectal pain that has been persistent for the last 2 days and constant.  She is also noted that her stools are soft, thin and frequent.  Denies any bloody bowel movements.  No genitourinary symptoms.  No fever.  She has had a decrease in appetite but no nausea/vomiting.  Home Medications Prior to Admission medications   Medication Sig Start Date End Date Taking? Authorizing Provider  Biotin 5000 MCG CAPS Take 1 capsule by mouth in the morning and at bedtime. Patient not taking: Reported on 06/10/2021    [provider]  cyanocobalamin 1000 MCG tablet Take 1,000 mcg by mouth daily. Patient not taking: Reported on 06/10/2021    [provider]  cycloSPORINE (RESTASIS) 0.05 % ophthalmic emulsion Place 1 drop into both eyes 2 (two) times daily. 06/18/18   [provider]  famotidine (PEPCID) 20 MG tablet TAKE 1 TABLET AT BEDTIME 10/11/21   Tanda Rockers, MD  gabapentin (NEURONTIN) 100 MG capsule Take 1 capsule (100 mg total) by mouth 4 (four) times daily. One three times daily Patient not taking: Reported on 06/10/2021 11/19/20   Tanda Rockers, MD  gabapentin (NEURONTIN) 300 MG capsule Start with 300 mg twice daily and build to 300 mg 4 x daily Patient not taking: Reported on 06/10/2021 11/19/20   Tanda Rockers, MD  montelukast (SINGULAIR) 10 MG tablet  TAKE 1 TABLET BY MOUTH EVERYDAY AT BEDTIME 01/29/21   Tanda Rockers, MD  omeprazole (PRILOSEC) 40 MG capsule TAKE 1 CAPSULE EVERY DAY 10/11/21   Tanda Rockers, MD  pravastatin (PRAVACHOL) 80 MG tablet Take 1 tablet (80 mg total) by mouth daily. 08/19/21   Jerline Pain, MD  UNABLE TO FIND Med Name: elderberry/zinc twice daily Patient not taking: Reported on 06/10/2021    [provider]  valACYclovir (VALTREX) 500 MG tablet Take 500 mg by mouth every other day.    [provider]      Allergies    Cefaclor, Codeine, Crestor [rosuvastatin], and Oxybutynin    Review of Systems   Review of Systems  Constitutional:  Negative for fever.  Respiratory:  Negative for shortness of breath.   Cardiovascular:  Negative for chest pain.  Gastrointestinal:  Positive for abdominal pain, diarrhea and rectal pain. Negative for abdominal distention, anal bleeding, blood in stool, constipation and vomiting.  Genitourinary:  Negative for dysuria and hematuria.  Skin:  Negative for rash.  Neurological:  Negative for headaches.   Physical Exam Updated Vital Signs BP (!) 150/59    Pulse (!) 58    Temp 98.2 F (36.8 C) (Oral)    Resp 18    Ht 5\' 4"  (1.626 m)    Wt 69.4 kg    LMP  (LMP Unknown)    SpO2 99%    BMI 26.26 kg/m  Physical Exam Vitals and  nursing note reviewed.  Constitutional:      Appearance: Normal appearance.  HENT:     Head: Normocephalic.     Mouth/Throat:     Mouth: Mucous membranes are moist.  Cardiovascular:     Rate and Rhythm: Normal rate.  Pulmonary:     Effort: Pulmonary effort is normal. No respiratory distress.  Abdominal:     Palpations: Abdomen is soft.     Tenderness: There is abdominal tenderness in the left lower quadrant. There is guarding. There is no rebound.  Genitourinary:    Comments: Rectal deferred  Skin:    General: Skin is warm.  Neurological:     Mental Status: She is alert and oriented to person, place, and time. Mental status is at  baseline.  Psychiatric:        Mood and Affect: Mood normal.    ED Results / Procedures / Treatments   Labs (all labs ordered are listed, but only abnormal results are displayed) Labs Reviewed  COMPREHENSIVE METABOLIC PANEL - Abnormal; Notable for the following components:      Result Value   Glucose, Bld 127 (*)    All other components within normal limits  LIPASE, BLOOD  CBC  URINALYSIS, ROUTINE W REFLEX MICROSCOPIC    EKG None  Radiology No results found.  Procedures Procedures    Medications Ordered in ED Medications  ketorolac (TORADOL) 30 MG/ML injection 30 mg (30 mg Intravenous Given 11/12/21 1646)    ED Course/ Medical Decision Making/ A&P                           Medical Decision Making Amount and/or Complexity of Data Reviewed Labs: ordered. Radiology: ordered.  Risk Prescription drug management.   71 year old female presents emergency department with rectal pain.  Vitals are normal and stable on arrival.  Physical exam is reassuring, no reproducible tenderness, no rectal findings, no pain with DRE or palpable abnormalities.  Blood work is reassuring, no leukocytosis, no signs of infection.  CT is reassuring without any acute findings.  Unclear if this is genuine rectal pain or if this is some sort of referred pain from what she is dealing with from a urology standpoint.  Plan to treat her pain and have her follow-up as an outpatient with urology with strict return to ED precautions.  Patient at this time appears safe and stable for discharge and close outpatient follow up. Discharge plan and strict return to ED precautions discussed, patient verbalizes understanding and agreement.        Final Clinical Impression(s) / ED Diagnoses Final diagnoses:  None    Rx / DC Orders ED Discharge Orders     None         Lorelle Gibbs, DO 11/12/21 2349

## 2021-11-12 NOTE — Discharge Instructions (Signed)
You have been seen and discharged from the emergency department.  Your blood work and CT scan were normal.  Take naproxen and Tylenol for pain control.  Use tramadol for stronger/breakthrough pain.  If you are taking the tramadol I would recommend a bowel regimen to prevent any constipation.  Call your urologist on Monday for reevaluation.  Follow-up with your primary provider for further evaluation and further care. Take home medications as prescribed. If you have any worsening symptoms worsening pain, bloody diarrhea, abdominal pain, high fevers or further concerns for your health please return to an emergency department for further evaluation.

## 2021-11-12 NOTE — ED Provider Notes (Signed)
[facility] EMERGENCY DEPARTMENT Provider Note   CSN: [id number] Arrival date & time: 11/12/21  1330     History  Chief Complaint  Patient presents with   Abdominal Pain    Tracey Henson is a 71 y.o. female.  HPI  71 year old female with past medical history of HLD, GERD, recent diagnosis of "dropped bladder" and is scheduled for sling surgery presents to the emergency department with concern for rectal pain.  Patient states has been having suprapubic and left lower quadrant pain for the past couple weeks.  This along with incontinence is what prompted her urology evaluation.  They have her plan for sling repair.  Over the last couple days she has developed rectal pain that has become constant.  Patient states she has loose and small bowel movements but denies any constipation.  Rectal pain is not worse with bowel movement.  There is been no bloody diarrhea.  No history of hemorrhoids.  She denies any vaginal pain, vaginal discharge or dysuria.  She has been taking Tylenol/ibuprofen with slight improvement of her pain.  Home Medications Prior to Admission medications   Medication Sig Start Date End Date Taking? Authorizing Provider  naproxen (NAPROSYN) 375 MG tablet Take 1 tablet (375 mg total) by mouth 2 (two) times daily. 11/12/21  Yes Tijuana Scheidegger, Alvin Critchley, DO  traMADol (ULTRAM) 50 MG tablet Take 1 tablet (50 mg total) by mouth every 12 (twelve) hours as needed for up to 3 days. 11/12/21 11/15/21 Yes Charae Depaolis, Alvin Critchley, DO  Biotin 5000 MCG CAPS Take 1 capsule by mouth in the morning and at bedtime. Patient not taking: Reported on 06/10/2021    [provider]  cyanocobalamin 1000 MCG tablet Take 1,000 mcg by mouth daily. Patient not taking: Reported on 06/10/2021    [provider]  cycloSPORINE (RESTASIS) 0.05 % ophthalmic emulsion Place 1 drop into both eyes 2 (two) times daily. 06/18/18   [provider]  famotidine (PEPCID) 20 MG tablet TAKE 1 TABLET  AT BEDTIME 10/11/21   Tanda Rockers, MD  gabapentin (NEURONTIN) 100 MG capsule Take 1 capsule (100 mg total) by mouth 4 (four) times daily. One three times daily Patient not taking: Reported on 06/10/2021 11/19/20   Tanda Rockers, MD  gabapentin (NEURONTIN) 300 MG capsule Start with 300 mg twice daily and build to 300 mg 4 x daily Patient not taking: Reported on 06/10/2021 11/19/20   Tanda Rockers, MD  montelukast (SINGULAIR) 10 MG tablet TAKE 1 TABLET BY MOUTH EVERYDAY AT BEDTIME 01/29/21   Tanda Rockers, MD  omeprazole (PRILOSEC) 40 MG capsule TAKE 1 CAPSULE EVERY DAY 10/11/21   Tanda Rockers, MD  pravastatin (PRAVACHOL) 80 MG tablet Take 1 tablet (80 mg total) by mouth daily. 08/19/21   Jerline Pain, MD  UNABLE TO FIND Med Name: elderberry/zinc twice daily Patient not taking: Reported on 06/10/2021    [provider]  valACYclovir (VALTREX) 500 MG tablet Take 500 mg by mouth every other day.    [provider]      Allergies    Cefaclor, Codeine, Crestor [rosuvastatin], and Oxybutynin    Review of Systems   Review of Systems  Constitutional:  Negative for fever.  Respiratory:  Negative for shortness of breath.   Cardiovascular:  Negative for chest pain.  Gastrointestinal:  Positive for rectal pain. Negative for abdominal distention, abdominal pain, anal bleeding, blood in stool, constipation, diarrhea and vomiting.  Genitourinary:  Negative for pelvic  pain, vaginal discharge and vaginal pain.  Skin:  Negative for rash.  Neurological:  Negative for headaches.   Physical Exam Updated Vital Signs BP (!) 144/69    Pulse 65    Temp 98.2 F (36.8 C) (Oral)    Resp 16    Ht 5\' 4"  (1.626 m)    Wt 69.4 kg    LMP  (LMP Unknown)    SpO2 97%    BMI 26.26 kg/m  Physical Exam Vitals and nursing note reviewed.  Constitutional:      General: She is not in acute distress.    Appearance: Normal appearance.  HENT:     Head: Normocephalic.     Mouth/Throat:     Mouth: Mucous  membranes are moist.  Cardiovascular:     Rate and Rhythm: Normal rate.  Pulmonary:     Effort: Pulmonary effort is normal. No respiratory distress.  Abdominal:     Palpations: Abdomen is soft.     Tenderness: There is no abdominal tenderness.  Genitourinary:    Comments: Rectal evaluation is unremarkable, no hemorrhoids, no bleeding, no erythema surrounding the rectum, no induration or fluctuance, nontender to palpation, external vagina is unremarkable Skin:    General: Skin is warm.  Neurological:     Mental Status: She is alert and oriented to person, place, and time. Mental status is at baseline.  Psychiatric:        Mood and Affect: Mood normal.    ED Results / Procedures / Treatments   Labs (all labs ordered are listed, but only abnormal results are displayed) Labs Reviewed  COMPREHENSIVE METABOLIC PANEL - Abnormal; Notable for the following components:      Result Value   Glucose, Bld 127 (*)    All other components within normal limits  LIPASE, BLOOD  CBC  URINALYSIS, ROUTINE W REFLEX MICROSCOPIC    EKG None  Radiology CT Abdomen Pelvis W Contrast  Result Date: 11/12/2021 CLINICAL DATA:  Left lower quadrant pain. EXAM: CT ABDOMEN AND PELVIS WITH CONTRAST TECHNIQUE: Multidetector CT imaging of the abdomen and pelvis was performed using the standard protocol following bolus administration of intravenous contrast. RADIATION DOSE REDUCTION: This exam was performed according to the departmental dose-optimization program which includes automated exposure control, adjustment of the mA and/or kV according to patient size and/or use of iterative reconstruction technique. CONTRAST:  149mL OMNIPAQUE IOHEXOL 300 MG/ML  SOLN COMPARISON:  Jan 31, 2018 FINDINGS: Lower chest: No acute abnormality. Hepatobiliary: No focal liver abnormality is seen. No gallstones, gallbladder wall thickening, or biliary dilatation. Pancreas: Unremarkable. No pancreatic ductal dilatation or surrounding  inflammatory changes. Spleen: Normal in size without focal abnormality. Adrenals/Urinary Tract: Adrenal glands are unremarkable. Kidneys are normal in size, without renal calculi or hydronephrosis. A subcentimeter cystic appearing areas seen within the upper pole of the right kidney. Bladder is unremarkable. Stomach/Bowel: Stomach is within normal limits. The appendix is surgically absent. No evidence of bowel wall thickening, distention, or inflammatory changes. Noninflamed diverticula are seen within the distal sigmoid colon. Vascular/Lymphatic: Aortic atherosclerosis. No enlarged abdominal or pelvic lymph nodes. Reproductive: Status post hysterectomy. No adnexal masses. Other: No abdominal wall hernia or abnormality. No abdominopelvic ascites. Musculoskeletal: No acute or significant osseous findings. IMPRESSION: 1. Sigmoid diverticulosis. 2. Aortic atherosclerosis. Aortic Atherosclerosis (ICD10-I70.0). Electronically Signed   By: Virgina Norfolk M.D.   On: 11/12/2021 18:22    Procedures Procedures    Medications Ordered in ED Medications  ketorolac (TORADOL) 30 MG/ML injection 30 mg (30  mg Intravenous Given 11/12/21 1646)  iohexol (OMNIPAQUE) 300 MG/ML solution 100 mL (100 mLs Intravenous Contrast Given 11/12/21 1746)    ED Course/ Medical Decision Making/ A&P                           Medical Decision Making Amount and/or Complexity of Data Reviewed Labs: ordered. Radiology: ordered.  Risk Prescription drug management.   71 year old female presents emergency department with rectal pain.  Vitals are normal and stable on arrival.  Patient is currently being evaluated for bladder sling surgery secondary to incontinence and lower abdominal pain.  Patient denies any fever.  She has had loose small stools but denies any constipation.  No rectal pain with bowel movements.  No vaginal or rectal discharge.  Blood work is reassuring and normal.  CT scan shows no acute abnormality.  Rectal exam  is reassuring without any acute findings.  There is no tenderness to palpation or DRE.  I do not know if there is a component of her bladder pathology that could be causing lower pelvic pain that is mimicking rectal pain but there is no acute pathology identified today.  We will treat her pain and she will follow-up with urology on Monday.  Patient at this time appears safe and stable for discharge and close outpatient follow up. Discharge plan and strict return to ED precautions discussed, patient verbalizes understanding and agreement.        Final Clinical Impression(s) / ED Diagnoses Final diagnoses:  Rectal pain    Rx / DC Orders ED Discharge Orders          Ordered    naproxen (NAPROSYN) 375 MG tablet  2 times daily        11/12/21 2015    traMADol (ULTRAM) 50 MG tablet  Every 12 hours PRN        11/12/21 2015              Harvy Riera, Alvin Critchley, DO 11/12/21 2115

## 2021-11-16 DIAGNOSIS — N8111 Cystocele, midline: Secondary | ICD-10-CM | POA: Diagnosis not present

## 2021-11-16 DIAGNOSIS — N393 Stress incontinence (female) (male): Secondary | ICD-10-CM | POA: Diagnosis not present

## 2021-12-02 DIAGNOSIS — Z01810 Encounter for preprocedural cardiovascular examination: Secondary | ICD-10-CM | POA: Diagnosis not present

## 2021-12-02 DIAGNOSIS — K6289 Other specified diseases of anus and rectum: Secondary | ICD-10-CM | POA: Diagnosis not present

## 2021-12-02 DIAGNOSIS — Z01812 Encounter for preprocedural laboratory examination: Secondary | ICD-10-CM | POA: Diagnosis not present

## 2021-12-03 ENCOUNTER — Ambulatory Visit: Payer: Medicare HMO | Admitting: Gastroenterology

## 2021-12-03 DIAGNOSIS — Z0489 Encounter for examination and observation for other specified reasons: Secondary | ICD-10-CM | POA: Diagnosis not present

## 2021-12-03 DIAGNOSIS — R195 Other fecal abnormalities: Secondary | ICD-10-CM | POA: Diagnosis not present

## 2021-12-03 DIAGNOSIS — R194 Change in bowel habit: Secondary | ICD-10-CM | POA: Diagnosis not present

## 2021-12-03 DIAGNOSIS — K6289 Other specified diseases of anus and rectum: Secondary | ICD-10-CM | POA: Diagnosis not present

## 2021-12-07 DIAGNOSIS — N393 Stress incontinence (female) (male): Secondary | ICD-10-CM | POA: Diagnosis not present

## 2021-12-14 DIAGNOSIS — Z8742 Personal history of other diseases of the female genital tract: Secondary | ICD-10-CM | POA: Diagnosis not present

## 2021-12-14 DIAGNOSIS — N393 Stress incontinence (female) (male): Secondary | ICD-10-CM | POA: Diagnosis not present

## 2022-02-28 DIAGNOSIS — J329 Chronic sinusitis, unspecified: Secondary | ICD-10-CM | POA: Diagnosis not present

## 2022-03-02 ENCOUNTER — Encounter: Payer: Self-pay | Admitting: Cardiology

## 2022-03-02 NOTE — Telephone Encounter (Signed)
MyChart message received from pt.  Due to the s/s reported, this RN called to speak with pt.  Pt reports for a couple of weeks she has not been feeling right.  She states her vision has been off e.I.  when someone walks by she feels as if there is a shadow that remains, also vision is blurred at times.  She reports her whole body feels "weird" and she has had an unrelenting headache.   She has been freezing water bottles and putting them on the back of her neck for some relief.   Pt also reports her speech has been garbled and words are not coming out right - "people are noticing". These s/s are increasing in frequency.  Pt denies any one sided weakness   Pt was seen at her PCP office on Monday for a sinus infection however she did not report any of these s/s to the provider who saw her.  She reported her BP was 152/30 something and O2 sat was 97% however in review BP was 125/55 and O2 sat was 98%, no heart rate listed in records.   Advised pt symptoms reported are concerning for stroke and she should be evaluated at her closest ED.  Pt states understanding and will proceed for further evaluation.

## 2022-03-07 ENCOUNTER — Ambulatory Visit
Admission: RE | Admit: 2022-03-07 | Discharge: 2022-03-07 | Disposition: A | Discharge status: Home / Self Care | Payer: Medicare HMO | Source: Ambulatory Visit | Attending: Physician Assistant | Admitting: Physician Assistant

## 2022-03-07 ENCOUNTER — Other Ambulatory Visit: Payer: Self-pay | Admitting: Physician Assistant

## 2022-03-07 DIAGNOSIS — J8410 Pulmonary fibrosis, unspecified: Secondary | ICD-10-CM | POA: Diagnosis not present

## 2022-03-07 DIAGNOSIS — R0989 Other specified symptoms and signs involving the circulatory and respiratory systems: Secondary | ICD-10-CM

## 2022-03-14 DIAGNOSIS — Z87448 Personal history of other diseases of urinary system: Secondary | ICD-10-CM | POA: Diagnosis not present

## 2022-03-14 DIAGNOSIS — N393 Stress incontinence (female) (male): Secondary | ICD-10-CM | POA: Diagnosis not present

## 2022-03-17 ENCOUNTER — Ambulatory Visit: Payer: Medicare HMO | Admitting: Internal Medicine

## 2022-03-17 ENCOUNTER — Encounter: Payer: Self-pay | Admitting: Internal Medicine

## 2022-03-17 VITALS — BP 106/64 | HR 76 | Temp 97.8°F | Ht 64.0 in | Wt 149.0 lb

## 2022-03-17 DIAGNOSIS — R058 Other specified cough: Secondary | ICD-10-CM | POA: Diagnosis not present

## 2022-03-17 MED ORDER — GABAPENTIN 100 MG PO CAPS: 100.0000 mg | ORAL_CAPSULE | Freq: Four times a day (QID) | ORAL | 3 refills | Status: DC

## 2022-03-17 MED ORDER — METHYLPREDNISOLONE ACETATE 80 MG/ML IJ SUSP
120.0000 mg | Freq: Once | INTRAMUSCULAR | Status: AC
  Administered 2022-03-17: 120 mg via INTRAMUSCULAR

## 2022-03-17 MED ORDER — BENZONATATE 200 MG PO CAPS: 200.0000 mg | ORAL_CAPSULE | Freq: Three times a day (TID) | ORAL | 1 refills | Status: DC | PRN

## 2022-03-17 NOTE — Progress Notes (Signed)
Subjective:    Patient ID: Tracey Henson, female    DOB: [birth year]     MRN: [id number]    Brief patient profile:  68   yowf never smoker with h/o seasonal rhinitis  X 1980s then intermittent cough mid 1990s not correlating with rhinitis maybe once or twice a week esp hs then doe progressive x 2016 and can't do fast pace walking or hills due to sob > cardiac eval > CT heart > mpns > referred 06/08/2015 by Dr Nancy Fetter to pulmonary clinic.     History of Present Illness  06/08/2015 1st [facility] office visit/ Tracey Henson   Chief Complaint  Patient presents with   Pulmonary Consult    Referred by Dr. Nancy Fetter for pulmonary nodules. Pt states that she has had DOE for "as long as I can remember"- gets out of breath pushing a lawn mower. She has had CP and cough that come and go.    onset of cough was indolent x 4 y prior to OV  assoc with sense of pnds daily and at hs but no excess mucus, then midline cp with severe coughing fits that occur once or twice a week assoc with doe and variable HB rec Try prilosec otc '20mg'$   Take 30-60 min before first meal of the day and Pepcid ac (famotidine) 20 mg one @  bedtime  Until return  For drainage take chlortrimeton (chlorpheniramine) 4 mg every 4 hours available over the counter (may cause drowsiness)  GERD diet  If still coughing you will need tessilon 200 mg up to 4 hours as needed    07/20/2015  f/u ov/Tracey Henson re: uacs / mpns Chief Complaint  Patient presents with   Follow-up    Pt states her breathing is unchanged.  She is still coughing and constantly clearing her throat.   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 Add singulair 10 mg each pm for now For drainage /  throat tickle try take CHLORPHENIRAMINE  4 mg - take one every 4 hours as needed - available over the counter- may cause drowsiness so start with just a bedtime dose or two and see how you tolerate it before trying in daytime   (could take 2 at bedtime)  Prednisone 10 mg take  4 each am x 2 days,   2 each am x 2 days,  1 each am x 2 days and stop  Stay on acid suppression until cough is gone for at least 2 weeks without the need for cough meds   08/18/2015  f/u ov/Tracey Henson re: cough better on prilosec /singulair / antihistamines Chief Complaint  Patient presents with   Follow-up    Pt states her cough has improved some. She is not clearing her throat as often. No new co's today.   breathing not an issue as long as doesn't push too hard  Noted more throat clearing when missed a dose of ppi Overall much better vs baseline  rec No change in meds x 3 months  If doing great > try pepcid 20 mg after bfast and supper  Please schedule a follow up visit in 3 months  but call sooner if needed> did not return as rec      11/20/2018  "New pt eval" Tracey Henson/ / does not remember whether h2 helped so now maint prilosec 1 h ac only 20 mg Chief Complaint  Patient presents with   Pulmonary Consult    Rerferred by Dr Marlou Porch for pulmonary nodules. Pt c/o SOB and non prod cough x 7 wks. She states she gets SOB walking room to room.  She is sleeping propped up due to SOB when she lies down flat.  Dyspnea:  Sob and cough abruptly  started about the same time acute s obvious trigger/ sev rounds abx and pred s help now persistent day and noct  Cough: 24/7 dry mostly early on insp worse with  cold air /perfumes  Sleeping: can't sleep even sitting up due to cough / can't take any narcotics "my heart rate drops real low"  SABA use: none 02:  None  gen ant chest discomfort with coughing fits  rec Prednisone 10 mg take  4 each am x 2 days,   2 each am x 2 days,  1 each am x 2 days and stop  Gabapentin 100 mg three times  daily  Restart the singulair 10 mg daily  For drainage / throat tickle try take CHLORPHENIRAMINE  4 mg  Pantoprazole (protonix) 40 mg   Take  30-60 min before first meal of the day and prilosec 30 min before the last  and Pepcid (famotidine)  20 mg one @  bedtime  GERD diet      televist 01/31/19 No change in medications until return  Weight control is simply a matter of calorie balance    If not better will schedule CPST on return  Pt has my chart but needs set up for office in 6 weeks with all meds     03/18/2019  f/u ov/Tracey Henson re:  Chest tight x 5 years daily symptoms  Chief Complaint  Patient presents with   Follow-up    Cough is some better since the last visit, but not resolved.   Dyspnea:  MMRC3 = can't walk 100 yards even at a slow pace at a flat grade s stopping due to sob   Cough: better ? Did she have  covid as exp [place] jan 2020 and sick on way back home Sleeping: no resp symptoms flat SABA use: none 02: none  Better with singulair  rec To get the most out of exercise, you need to be continuously aware that you are short of breath, but never out of breath, for 30 minutes daily. As you improve, it will actually be easier for you to do the same amount of exercise  in  30 minutes so always push to the level where you are short of breath.    Please remember to go to the lab department   for your tests - we will call you with the results when they are available.   06/27/2019  f/u ov/Tracey Henson re: unexplained doe x adult life  / chest tightness not related to ex / skains eval 2012  Chief Complaint  Patient presents with   Follow-up    Pt c/o increased SOB and chest tightness. She states her chest feels full.   Dyspnea:  Ever since adult has noted doe but just with hills or even slow jog Can do rapid walk all day  Cough: much better  Sleeping: ok but immediately aware of chest tightness once awake, does not  however disturb sleep SABA use: none, didn't worse 02: none   Stopping gabapentin over next several weeks for neck pain  rec Work on building up to 30 min short of breath but never out of breath daily  We will set you up for full pfts next available then refer CPST once this available (no office visit is needed) Ct with contrast will need to be scheduled also  > neither done as rec    07/10/20 CT w/o contrast 1. No explanation for RIGHT-sided chest pain. 2. No change in RIGHT lung pulmonary nodules. 3. Reduction in size of partially calcified mediastinal andRIGHT hilar lymph nodes. Findings suggest prior/chronic granulomatous disease.   11/19/2020  Acute ext ov/Tracey Henson re: worse cough / cp since  04/2020 and son passed 05/2020 but says lifelong doe/ chest tightness never improved  Chief Complaint  Patient presents with   Acute Visit    Breathing has worsened since the last visit. She states "out of breath all time time". She has coughing- non prod and wheezing at hs.. She c/o right side pain for the past year- "deep"- and constantly bothers her.   Dyspnea:  1.8 miles one day prior to OV  X 32 min  Cough: more after supper / non productive ? Worse off gabapentin Sleeping: bed is flat prefer L side down as hurts on R chest wall if lie on it  SABA use: none 02: none  Covid status:   vax x 3  New  R lateral cp is positional eg lying on r side  s trauma or rash ? Cough exac came first  Rec Try prilosec(omeprazole)  otc 40  Take 30-60 min before first meal of the day and Pepcid ac (famotidine) 20 mg one an hour or so before bedtime  Gabapentin 100 mg build up to 4 x daily  And if not better then start new rx for gabapentin 300 mg twice daily and gradually build up to 4 x daily or whatever the lowest dose is that works GERD diet reviewed, bed blocks rec  To get the most out of exercise, you need to be continuously aware that you are short of breath, but never out of breath, for 30 minutes daily.     03/17/2022 Acute ov/Tracey Henson re: recurrent cough    maint on  just gerd rx Tracey Henson   Chief Complaint  Patient presents with   Acute Visit    Pt states she is having problems breathing x3 weeks. Antibiotics and steriods not helping. Pt states she feels like her lungs are on fire and she is hoarse.   Dyspnea:  had improved to point where she could walk up to 2 miles in 30 min including hills last did this end of May 2023 Rx breztri / pred/ augmentin / assoc with hoarseness and pnds  Cough: dry hack worse when hot air to cold, better hs and starts up w/in 2 hours  and harsh hacking > midline chest and back pain p coughing fits despite gerd rx/ tessalon best rx  Sleeping: ok overnight / one pillow flat bed  SABA use: breztri not helping  02: none  Covid status:   Jan 2020 / 2 vax    No obvious day to day or daytime variability or assoc excess/ purulent sputum or mucus plugs or hemoptysis or chest tightness, subjective wheeze or overt sinus or hb symptoms.   Sleeping  without nocturnal  or early am exacerbation  of respiratory  c/o's or need for  noct saba. Also denies any obvious fluctuation of symptoms with weather or environmental changes or other aggravating or alleviating factors except as outlined above   No unusual exposure hx or h/o childhood pna/ asthma or knowledge of premature birth.  Current Allergies, Complete Past Medical History, Past Surgical History, Family History, and Social History were reviewed in [employer] record.  ROS  The following are not active complaints unless bolded Hoarseness, sore throat/globus  dysphagia, dental problems, itching, sneezing,  nasal congestion or discharge of excess mucus or purulent secretions, ear ache,   fever, chills, sweats, unintended wt loss or wt gain, classically pleuritic or exertional cp,  orthopnea pnd or arm/hand swelling  or leg swelling, presyncope, palpitations, abdominal pain, anorexia, nausea, vomiting, diarrhea  or change in bowel habits or change in bladder habits,  change in stools or change in urine, dysuria, hematuria,  rash, arthralgias, visual complaints, headache, numbness, weakness or ataxia or problems with walking or coordination,  change in mood or  memory.        Current Meds  Medication Sig   Budeson-Glycopyrrol-Formoterol (BREZTRI AEROSPHERE) 160-9-4.8 MCG/ACT AERO Inhale 4.8 g into the lungs in the morning and at bedtime.   Cholecalciferol (VITAMIN D3) 1.25 MG (50000 UT) TABS Take 25 mcg by mouth daily.   cyanocobalamin 1000 MCG tablet Take 1,000 mcg by mouth daily.   cycloSPORINE (RESTASIS) 0.05 % ophthalmic emulsion Place 1 drop into both eyes 2 (two) times daily.   famotidine (PEPCID) 20 MG tablet TAKE 1 TABLET AT BEDTIME   montelukast (SINGULAIR) 10 MG tablet TAKE 1 TABLET BY MOUTH EVERYDAY AT BEDTIME   omeprazole (PRILOSEC) 40 MG capsule TAKE 1 CAPSULE EVERY DAY   pravastatin (PRAVACHOL) 80 MG tablet Take 1 tablet (80 mg total) by mouth daily.   UNABLE TO FIND Med Name: elderberry/zinc twice daily   valACYclovir (VALTREX) 500 MG tablet Take 500 mg by mouth every other day.   [DISCONTINUED] naproxen (NAPROSYN) 375 MG tablet Take 1 tablet (375 mg total) by mouth 2 (two) times daily.                     Objective:   Physical Exam    Wts  03/17/2022     149  11/19/2020       160 06/27/2019     156  03/18/2019     155  07/20/2015         139 >  08/18/2015 142 > 11/20/2018   155    06/08/15 140 lb (63.504 kg)  05/22/15 140 lb (63.504 kg)  07/04/14 138 lb (62.596 kg)      Vital signs reviewed  03/17/2022  - Note at rest 02 sats  98% on RA   General appearance:    despondent amb wf nad no spont cough   HEENT : Oropharynx  clear     Nasal turbinates nl    NECK :  without  appent JVD/ palpable Nodes/TM    LUNGS: no acc muscle use,  Nl contour chest which is clear to A and P bilaterally without cough on insp or exp maneuvers   CV:  RRR  no s3 or murmur or increase in P2, and no edema   ABD:  soft and nontender with nl  inspiratory excursion in the supine position. No bruits or organomegaly appreciated   MS:  Nl gait/ ext warm without deformities Or obvious joint restrictions  calf tenderness, cyanosis or clubbing    SKIN:  warm and dry without lesions    NEURO:  alert, approp, nl sensorium with  no motor or cerebellar deficits apparent.     I personally reviewed images and agree with radiology impression as follows:  CXR:  03/07/22  pa and lateral  No active cardiopulmonary disease.       Assessment & Plan:

## 2022-03-17 NOTE — Patient Instructions (Addendum)
Continue omeprazole 40 mg proton pump inhibitor and the famotidine 20 mg after supper   Gabapentin 100 mg four times daily ok to adjust up to 1200 mg per 24 hours   While gabapentin is building up ok to take Tessalon 200 mg every 6 hours as needed   GERD (REFLUX)  is an extremely common cause of respiratory symptoms just like yours , many times with no obvious heartburn at all.    It can be treated with medication, but also with lifestyle changes including elevation of the head of your bed (ideally with 6 -8inch blocks under the headboard of your bed),  Smoking cessation, avoidance of late meals, excessive alcohol, and avoid fatty foods, chocolate, peppermint, colas, red wine, and acidic juices such as orange juice.  NO MINT OR MENTHOL PRODUCTS SO NO COUGH DROPS  USE SUGARLESS CANDY INSTEAD (Jolley ranchers or Stover's or Life Savers) or even ice chips will also do - the key is to swallow to prevent all throat clearing. NO OIL BASED VITAMINS - use powdered substitutes.  Avoid fish oil when coughing.   For drainage / throat tickle try take CHLORPHENIRAMINE  4 mg  ("Allergy Relief" '4mg'$   at [facility] should be easiest to find in the blue box usually on bottom shelf)  take one every 4 hours as needed - extremely effective and inexpensive over the counter- may cause drowsiness so start with just a dose or two an hour before bedtime and see how you tolerate it before trying in daytime.   Depomedrol 120 mg today and stop breztri and cough drops   Call if not better in two weeks

## 2022-03-17 NOTE — Assessment & Plan Note (Signed)
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  The other paranasal sinuses appear normal. - 11/20/2018 restarted singulair/ gabapentin rx as intol of narcs ("low pulse") > cough  improved as of 01/31/2019  - Covid 19 Antibodies 03/18/2019 = neg  - 06/27/2019 tapering off gabapenitin > f/u prn flare - 11/19/2020 resumed gabapentin for flare since 04/2020  - 01/2022 recurrrent cough/ sob same as prior flares off gabapentin - 3/47/4259 cyclical cough rx with gabapentin build to 1200 mg / day or lowest effective dose   Of the three most common causes of  Sub-acute / recurrent or chronic cough, only one (GERD)  can actually contribute to/ trigger  the other two (asthma and post nasal drip syndrome)  and perpetuate the cylce of cough.  While not intuitively obvious, many patients with chronic low grade reflux do not cough until there is a primary insult that disturbs the protective epithelial barrier and exposes sensitive nerve endings.   This is typically viral but can due to PNDS and  either may apply here.    >>> The point is that once this occurs, it is difficult to eliminate the cycle  using anything but a maximally effective acid suppression regimen at least in the short run, accompanied by an appropriate diet to address non acid GERD and control / eliminate the cough itself with gabapentin as above plus 1st gen H1 blockers per guidelines  And >>> also so added  depomedrol 120 mg IM in case of component of Th-2 driven upper or lower airways inflammation (if cough responds short term only to relapse before return while will on full rx for uacs (as above), then  that would point to allergic rhinitis/ asthma or eos bronchitis as alternative dx)    Ok off breztri as this does not appear to be any form of asthma though can't r/o allergy  F/u in 2 weeks if not better         Each maintenance medication was reviewed in detail including emphasizing most importantly the difference between maintenance and prns and under what circumstances the prns are to be triggered using an action plan format where appropriate.  Total time for H and P, chart review, counseling, reviewing hfadevice(s) and generating customized AVS unique to this Acute office visit / same day charting > 30 min for multiple  refractory respiratory  symptoms of uncertain etiology

## 2022-03-28 ENCOUNTER — Encounter: Payer: Self-pay | Admitting: Internal Medicine

## 2022-03-28 NOTE — Telephone Encounter (Signed)
Ok to add on the 7/13 schedule at end of day (I will be in [place], not [place]) [place] to stop gabapentin completely for a few days as it takes a few to wear off

## 2022-03-28 NOTE — Telephone Encounter (Signed)
Sorry to hear that but any effects from gabapentin should resolve w/in 24 h of stopping the medication which is what I rec now then regroup with all meds in hand later this week ( I have plenty of availability)

## 2022-03-31 ENCOUNTER — Encounter: Payer: Self-pay | Admitting: Internal Medicine

## 2022-03-31 ENCOUNTER — Ambulatory Visit: Payer: Medicare HMO | Admitting: Internal Medicine

## 2022-03-31 DIAGNOSIS — R058 Other specified cough: Secondary | ICD-10-CM | POA: Diagnosis not present

## 2022-03-31 NOTE — Patient Instructions (Signed)
No change In medications  Please schedule a follow up visit in 6 months but call sooner if needed

## 2022-03-31 NOTE — Progress Notes (Signed)
Subjective:    Patient ID: Tracey Henson, female    DOB: 1951/01/18     MRN: [id number]    Brief patient profile:  69   yowf never smoker with h/o seasonal rhinitis  X 1980s then intermittent cough mid 1990s not correlating with rhinitis maybe once or twice a week esp hs then doe progressive x 2016 and can't do fast pace walking or hills due to sob > cardiac eval > CT heart > mpns > referred 06/08/2015 by Dr Nancy Fetter to pulmonary clinic.     History of Present Illness  06/08/2015 1st [place] [place] [facility] office visit/ Tracey Henson   Chief Complaint  Patient presents with   Pulmonary Consult    Referred by Dr. Nancy Fetter for pulmonary nodules. Pt states that she has had DOE for "as long as I can remember"- gets out of breath pushing a lawn mower. She has had CP and cough that come and go.    onset of cough was indolent x 4 y prior to OV  assoc with sense of pnds daily and at hs but no excess mucus, then midline cp with severe coughing fits that occur once or twice a week assoc with doe and variable HB rec Try prilosec otc '20mg'$   Take 30-60 min before first meal of the day and Pepcid ac (famotidine) 20 mg one @  bedtime  Until return  For drainage take chlortrimeton (chlorpheniramine) 4 mg every 4 hours available over the counter (may cause drowsiness)  GERD diet  If still coughing you will need tessilon 200 mg up to 4 hours as needed    07/20/2015  f/u ov/Tracey Henson re: uacs / mpns Chief Complaint  Patient presents with   Follow-up    Pt states her breathing is unchanged.  She is still coughing and constantly clearing her throat.   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 Add singulair 10 mg each pm for now For drainage /  throat tickle try take CHLORPHENIRAMINE  4 mg - take one every 4 hours as needed - available over the counter- may cause drowsiness so start with just a bedtime dose or two and see how you tolerate it before trying in daytime   (could take 2 at bedtime)  Prednisone 10 mg take  4 each am x 2 days,   2 each am x 2 days,  1 each am x 2 days and stop  Stay on acid suppression until cough is gone for at least 2 weeks without the need for cough meds   08/18/2015  f/u ov/Tracey Henson re: cough better on prilosec /singulair / antihistamines Chief Complaint  Patient presents with   Follow-up    Pt states her cough has improved some. She is not clearing her throat as often. No new co's today.   breathing not an issue as long as doesn't push too hard  Noted more throat clearing when missed a dose of ppi Overall much better vs baseline  rec No change in meds x 3 months  If doing great > try pepcid 20 mg after bfast and supper  Please schedule a follow up visit in 3 months  but call sooner if needed> did not return as rec      11/20/2018  "New pt eval" Tracey Henson/ / does not remember whether h2 helped so now maint prilosec 1 h ac only 20 mg Chief Complaint  Patient presents with   Pulmonary Consult    Rerferred by Dr Marlou Porch for pulmonary nodules. Pt c/o SOB and non prod cough x 7 wks. She states she gets SOB walking room to room.  She is sleeping propped up due to SOB when she lies down flat.  Dyspnea:  Sob and cough abruptly  started about the same time acute s obvious trigger/ sev rounds abx and pred s help now persistent day and noct  Cough: 24/7 dry mostly early on insp worse with  cold air /perfumes  Sleeping: can't sleep even sitting up due to cough / can't take any narcotics "my heart rate drops real low"  SABA use: none 02:  None  gen ant chest discomfort with coughing fits  rec Prednisone 10 mg take  4 each am x 2 days,   2 each am x 2 days,  1 each am x 2 days and stop  Gabapentin 100 mg three times  daily  Restart the singulair 10 mg daily  For drainage / throat tickle try take CHLORPHENIRAMINE  4 mg  Pantoprazole (protonix) 40 mg   Take  30-60 min before first meal of the day and prilosec 30 min before the last  and Pepcid (famotidine)  20 mg one @  bedtime  GERD diet      televist 01/31/19 No change in medications until return  Weight control is simply a matter of calorie balance    If not better will schedule CPST on return  Pt has my chart but needs set up for office in 6 weeks with all meds     03/18/2019  f/u ov/Tracey Henson re:  Chest tight x 5 years daily symptoms  Chief Complaint  Patient presents with   Follow-up    Cough is some better since the last visit, but not resolved.   Dyspnea:  MMRC3 = can't walk 100 yards even at a slow pace at a flat grade s stopping due to sob   Cough: better ? Did she have  covid as exp [place] jan 2020 and sick on way back home Sleeping: no resp symptoms flat SABA use: none 02: none  Better with singulair  rec To get the most out of exercise, you need to be continuously aware that you are short of breath, but never out of breath, for 30 minutes daily. As you improve, it will actually be easier for you to do the same amount of exercise  in  30 minutes so always push to the level where you are short of breath.    Please remember to go to the lab department   for your tests - we will call you with the results when they are available.   06/27/2019  f/u ov/Tracey Henson re: unexplained doe x adult life  / chest tightness not related to ex / skains eval 2012  Chief Complaint  Patient presents with   Follow-up    Pt c/o increased SOB and chest tightness. She states her chest feels full.   Dyspnea:  Ever since adult has noted doe but just with hills or even slow jog Can do rapid walk all day  Cough: much better  Sleeping: ok but immediately aware of chest tightness once awake, does not  however disturb sleep SABA use: none, didn't worse 02: none   Stopping gabapentin over next several weeks for neck pain  rec Work on building up to 30 min short of breath but never out of breath daily  We will set you up for full pfts next available then refer CPST once this available (no office visit is needed) Ct with contrast will need to be scheduled also  > neither done as rec    07/10/20 CT w/o contrast 1. No explanation for RIGHT-sided chest pain. 2. No change in RIGHT lung pulmonary nodules. 3. Reduction in size of partially calcified mediastinal and RIGHT hilar lymph nodes. Findings suggest prior/chronic granulomatous disease.   11/19/2020  Acute ext ov/Tracey Henson re: worse cough / cp since  04/2020 and son passed 05/2020 but says lifelong doe/ chest tightness never improved  Chief Complaint  Patient presents with   Acute Visit    Breathing has worsened since the last visit. She states "out of breath all time time". She has coughing- non prod and wheezing at hs.. She c/o right side pain for the past year- "deep"- and constantly bothers her.   Dyspnea:  1.8 miles one day prior to OV  X 32 min  Cough: more after supper / non productive ? Worse off gabapentin Sleeping: bed is flat prefer L side down as hurts on R chest wall if lie on it  SABA use: none 02: none  Covid status:   vax x 3  New  R lateral cp is positional eg lying on r side  s trauma or rash ? Cough exac came first  Rec Try prilosec(omeprazole)  otc 40  Take 30-60 min before first meal of the day and Pepcid ac (famotidine) 20 mg one an hour or so before bedtime  Gabapentin 100 mg build up to 4 x daily  And if not better then start new rx for gabapentin 300 mg twice daily and gradually build up to 4 x daily or whatever the lowest dose is that works GERD diet reviewed, bed blocks rec  To get the most out of exercise, you need to be continuously aware that you are short of breath, but never out of breath, for 30 minutes daily.     03/17/2022 Acute ov/Tracey Henson re: recurrent cough    maint  on just gerd rx Laurine Blazer   Chief Complaint  Patient presents with   Acute Visit    Pt states she is having problems breathing x3 weeks. Antibiotics and steriods not helping. Pt states she feels like her lungs are on fire and she is hoarse.   Dyspnea:  had improved to point where she could walk up to 2 miles in 30 min including hills last did this end of May 2023 Rx breztri / pred/ augmentin / assoc with hoarseness and pnds  Cough: dry hack worse when hot air to cold, better hs and starts up w/in 2 hours  and harsh hacking > midline chest and back pain p coughing fits despite gerd rx/ tessalon best rx  Sleeping: ok overnight / one pillow flat bed  SABA use: breztri not helping  02: none  Covid status:   Jan 2020 / 2 vax  Rec Continue omeprazole 40 mg  Take 30-60 min before first meal of the day and the famotidine 20 mg after supper  Gabapentin 100 mg four times daily ok to adjust up to 1200 mg per 24 hours  While gabapentin is building up ok to take [employer]  200 mg every 6 hours as needed  GERD  For drainage / throat tickle try take CHLORPHENIRAMINE  4 mg   Depomedrol 120 mg today and stop breztri and cough drops  Call if not better in two weeks      03/31/2022  f/u ov/Tracey Henson re: cough is gone    maint on singulair, gerd rx only   Chief Complaint  Patient presents with   Acute Visit    Muscle cramps.   Dyspnea:  working back up to 2 miles  Cough: gone/ no need for tessalon  Sleeping: flat bed on side fine SABA use: none 02: none  Muscle crramps on gabapentin 100 mg qid have resolved, thinks she may have had similar problem with prior use but note the cough is also gone now      No obvious day to day or daytime variability or assoc excess/ purulent sputum or mucus plugs or hemoptysis or cp or chest tightness, subjective wheeze or overt sinus or hb symptoms.   Sleeping  without nocturnal  or early am exacerbation  of respiratory  c/o's or need for noct saba. Also denies any  obvious fluctuation of symptoms with weather or environmental changes or other aggravating or alleviating factors except as outlined above   No unusual exposure hx or h/o childhood pna/ asthma or knowledge of premature birth.  Current Allergies, Complete Past Medical History, Past Surgical History, Family History, and Social History were reviewed in [employer] record.  ROS  The following are not active complaints unless bolded Hoarseness, sore throat, dysphagia, dental problems, itching, sneezing,  nasal congestion or discharge of excess mucus or purulent secretions, ear ache,   fever, chills, sweats, unintended wt loss or wt gain, classically pleuritic or exertional cp,  orthopnea pnd or arm/hand swelling  or leg swelling, presyncope, palpitations, abdominal pain, anorexia, nausea, vomiting, diarrhea  or change in bowel habits or change in bladder habits, change in stools or change in urine, dysuria, hematuria,  rash, arthralgias, visual complaints, headache, numbness, weakness or ataxia or problems with walking or coordination,  change in mood or  memory.        Current Meds  Medication Sig   benzonatate (TESSALON) 200 MG capsule Take 1 capsule (200 mg total) by mouth 3 (three) times daily as needed for cough.   Cholecalciferol (VITAMIN D3) 1.25 MG (50000 UT) TABS Take 25 mcg by mouth daily.   cyanocobalamin 1000 MCG tablet Take 1,000 mcg by mouth daily.   cycloSPORINE (RESTASIS) 0.05 % ophthalmic emulsion Place 1 drop into both eyes 2 (two) times daily.   famotidine (PEPCID) 20 MG tablet TAKE 1 TABLET AT BEDTIME   montelukast (SINGULAIR) 10 MG tablet TAKE 1 TABLET BY MOUTH EVERYDAY AT BEDTIME   omeprazole (PRILOSEC) 40 MG capsule TAKE 1 CAPSULE EVERY DAY   pravastatin (PRAVACHOL) 80 MG tablet Take 1 tablet (80 mg total) by mouth daily.   UNABLE TO FIND Med Name: elderberry/zinc twice daily   valACYclovir (VALTREX) 500 MG tablet Take 500 mg by mouth every other day.                        Objective:   Physical Exam    Wts  03/31/2022     148  03/17/2022     149  11/19/2020       160 06/27/2019     156  03/18/2019     155  07/20/2015  139 >  08/18/2015 142 > 11/20/2018   155    06/08/15 140 lb (63.504 kg)  05/22/15 140 lb (63.504 kg)  07/04/14 138 lb (62.596 kg)      Vital signs reviewed  03/31/2022  - Note at rest 02 sats  96% on RA   General appearance:    amb wf nad   HEENT : Oropharynx  clear     Nasal turbinates nl    NECK :  without  apparent JVD/ palpable Nodes/TM    LUNGS: no acc muscle use,  Nl contour chest which is clear to A and P bilaterally without cough on insp or exp maneuvers   CV:  RRR  no s3 or murmur or increase in P2, and no edema   ABD:  soft and nontender with nl inspiratory excursion in the supine position. No bruits or organomegaly appreciated   MS:  Nl gait/ ext warm without deformities Or obvious joint restrictions  calf tenderness, cyanosis or clubbing    SKIN: warm and dry without lesions    NEURO:  alert, approp, nl sensorium with  no motor or cerebellar deficits apparent.             Assessment & Plan:

## 2022-04-01 ENCOUNTER — Encounter: Payer: Self-pay | Admitting: Internal Medicine

## 2022-04-01 NOTE — Assessment & Plan Note (Signed)
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  The other paranasal sinuses appear normal. - 11/20/2018 restarted singulair/ gabapentin rx as intol of narcs ("low pulse") > cough  improved as of 01/31/2019  - Covid 19 Antibodies 03/18/2019 = neg  - 06/27/2019 tapering off gabapenitin > f/u prn flare - 11/19/2020 resumed gabapentin for flare since 04/2020  - 01/2022 recurrrent cough/ sob same as prior flares off gabapentin - 1/61/0960 cyclical cough rx with gabapentin build to 1200 mg / day or lowest effective dose > cramps at 400 mg / day but cough resolved  Recurrent cyclical cough has resolved for now so rec no change rx and consider trial of lyrica at next flare as this was not really a serious enough intolerance to avoid the class entirely, noting the problem resolved within a few day off the  Med > advised and  she agrees  F/u q 6 m, sooner prn  Each maintenance medication was reviewed in detail including emphasizing most importantly the difference between maintenance and prns and under what circumstances the prns are to be triggered using an action plan format where appropriate.  Total time for H and P, chart review, counseling,  and generating customized AVS unique to this office visit / same day charting = 21 min

## 2022-04-03 ENCOUNTER — Encounter: Payer: Self-pay | Admitting: Internal Medicine

## 2022-04-04 ENCOUNTER — Encounter: Payer: Self-pay | Admitting: Internal Medicine

## 2022-04-04 DIAGNOSIS — R058 Other specified cough: Secondary | ICD-10-CM

## 2022-04-04 MED ORDER — PREGABALIN 50 MG PO CAPS: ORAL_CAPSULE | ORAL | 2 refills | Status: DC

## 2022-04-04 NOTE — Telephone Encounter (Signed)
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

## 2022-04-04 NOTE — Telephone Encounter (Signed)
Done

## 2022-04-05 NOTE — Telephone Encounter (Signed)
Patient had requested Lyrica to be sent to her local pharmacy instead of [place]. I called [facility] and spoke with Threasa Beards, she has cancelled the prescription. I changed the pharmacy and pended the order.   Dr. Melvyn Novas, do you mind resending the Lyrica to [facility] on [address]? I have the order pended for you. Thank you!

## 2022-04-05 NOTE — Telephone Encounter (Signed)
Dr. Melvyn Novas, disregard previous message. Pt is fine with script being sent to [facility].   Nothing further needed.

## 2022-04-21 DIAGNOSIS — Z0100 Encounter for examination of eyes and vision without abnormal findings: Secondary | ICD-10-CM | POA: Diagnosis not present

## 2022-04-21 DIAGNOSIS — H524 Presbyopia: Secondary | ICD-10-CM | POA: Diagnosis not present

## 2022-05-23 ENCOUNTER — Encounter: Payer: Self-pay | Admitting: Internal Medicine

## 2022-05-24 MED ORDER — MONTELUKAST SODIUM 10 MG PO TABS: ORAL_TABLET | ORAL | 1 refills | Status: DC

## 2022-06-06 DIAGNOSIS — H6123 Impacted cerumen, bilateral: Secondary | ICD-10-CM | POA: Diagnosis not present

## 2022-06-06 DIAGNOSIS — R0989 Other specified symptoms and signs involving the circulatory and respiratory systems: Secondary | ICD-10-CM | POA: Diagnosis not present

## 2022-07-04 ENCOUNTER — Ambulatory Visit: Payer: Medicare HMO | Attending: Cardiology | Admitting: Cardiology

## 2022-07-04 ENCOUNTER — Encounter: Payer: Self-pay | Admitting: Cardiology

## 2022-07-04 VITALS — BP 130/70 | HR 67 | Ht 64.0 in | Wt 155.0 lb

## 2022-07-04 DIAGNOSIS — Z01812 Encounter for preprocedural laboratory examination: Secondary | ICD-10-CM

## 2022-07-04 DIAGNOSIS — R072 Precordial pain: Secondary | ICD-10-CM

## 2022-07-04 DIAGNOSIS — R0609 Other forms of dyspnea: Secondary | ICD-10-CM

## 2022-07-04 MED ORDER — METOPROLOL TARTRATE 100 MG PO TABS
100.0000 mg | ORAL_TABLET | ORAL | 0 refills | Status: DC
Start: 2022-07-04 — End: 2023-06-26

## 2022-07-04 NOTE — Progress Notes (Signed)
Cardiology Office Note:    Date:  07/04/2022   ID:  Tracey Henson, DOB [birth year], MRN [id number]  PCP:  Donald Prose, MD  Cardiologist:  Candee Furbish, MD  Electrophysiologist:  None   Referring MD: Donald Prose, MD   History of Present Illness:    Tracey Henson is a 71 y.o. female here for the follow-up of hypertension, and palpitations.  She wore a monitor 06/2021 which was remarkable for 21 supraventricular tachycardia runs, longest lasting 14 beats with an avg rate of 120 bpm. Symptoms were surounding these episodes. PAC's and PVC's were rare. No atrial fibrillation. First step is to try conservative management - she was instructed to decrease caffeine. If needed, we can try metoprolol. Overall these are benign.  Previously in 2018 Holter monitor showed paroxysmal atrial tachycardia brief episodes as well as PACs and PVCs.  Has a strong family history of coronary artery disease: father, 80 siblings with CAD. CVD 10-year risk score is 8.7% falls in the statin benefit group. On 04/28/20 visit, she stated she had increasing fatigue and occasional heart pounding, chest discomfort and included jaw pain. Also included leg pain when walking and waking up at night with leg cramping in calves.   At her last appointment she complained of chest pain inferior to her left breast. She also noted palpitations waking her from sleep. A Zio patch monitor was started.  Today: She is concerned that she is unable to walk like she used to. When going uphill especially she is short of breath and breathing heavily. She also complains of associated chest tightness, sometimes feeling like an elephant is sitting on her chest.  Mostly at night, she continues to have intermittent fluttering sensations associated  with hard and fast heart beats.  She denies any peripheral edema, lightheadedness, headaches, syncope, orthopnea, or PND.   Past Medical History:  Diagnosis Date   Asthma    seasonal   Baker cyst     left leg   Baker's cyst, left    left leg   Breast cyst    Frequent headaches    GERD (gastroesophageal reflux disease)    Hearing loss    bilateral   Hypercholesteremia    Muscle cramps    Vertigo     Past Surgical History:  Procedure Laterality Date   ABDOMINAL HYSTERECTOMY  1987   APPENDECTOMY  1963   BILATERAL OOPHORECTOMY     BREAST CYST EXCISION Bilateral pt unsure   BREAST EXCISIONAL BIOPSY Right    x1   BREAST EXCISIONAL BIOPSY Left    x2   BREAST SURGERY  1990,1991   lumpectomy, 3 x R breats, 2 x Left breast   SEPTOPLASTY     TEMPOROMANDIBULAR JOINT SURGERY  1998    Current Medications: Current Meds  Medication Sig   benzonatate (TESSALON) 200 MG capsule Take 1 capsule (200 mg total) by mouth 3 (three) times daily as needed for cough.   Cholecalciferol (VITAMIN D3) 1.25 MG (50000 UT) TABS Take 25 mcg by mouth daily.   cycloSPORINE (RESTASIS) 0.05 % ophthalmic emulsion Place 1 drop into both eyes 2 (two) times daily.   famotidine (PEPCID) 20 MG tablet TAKE 1 TABLET AT BEDTIME   metoprolol tartrate (LOPRESSOR) 100 MG tablet Take 1 tablet (100 mg total) by mouth as directed. Take one tablet 2 hours before your CT scan   montelukast (SINGULAIR) 10 MG tablet TAKE 1 TABLET BY MOUTH EVERYDAY AT BEDTIME   omeprazole (PRILOSEC) 40 MG capsule  TAKE 1 CAPSULE EVERY DAY   pravastatin (PRAVACHOL) 80 MG tablet Take 1 tablet (80 mg total) by mouth daily.   UNABLE TO FIND Med Name: elderberry/zinc twice daily   valACYclovir (VALTREX) 500 MG tablet Take 500 mg by mouth every other day.     Allergies:   Vancomycin, Cefaclor, Codeine, Crestor [rosuvastatin], Gabapentin, and Oxybutynin   Social History   Socioeconomic History   Marital status: Single    Spouse name: Not on file   Number of children: 2   Years of education: assoc degree   Highest education level: Associate degree: academic program  Occupational History    Comment: [employer]  Tobacco Use   Smoking  status: Never    Passive exposure: Past   Smokeless tobacco: Never  Vaping Use   Vaping Use: Never used  Substance and Sexual Activity   Alcohol use: No   Drug use: Never   Sexual activity: Not on file  Other Topics Concern   Not on file  Social History Narrative   Caffeine- none. Lives home alone.  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  3 children.  Caffein 2-3 cups daily (sometimes)   Social Determinants of Health   Financial Resource Strain: Not on file  Food Insecurity: Not on file  Transportation Needs: Not on file  Physical Activity: Not on file  Stress: Not on file  Social Connections: Not on file     Family History: The patient's family history includes Breast cancer in her maternal aunt; Cancer in her maternal aunt and paternal grandmother; Coronary artery disease in her father, maternal grandfather, and mother; Heart attack in her mother; Heart attack (age of onset: 55) in her father; Heart attack (age of onset: 69) in her maternal grandfather; Heart disease in her sister; Hypertension in her brother and sister; Lung cancer (age of onset: 88) in her mother; Stroke (age of onset: 64) in her maternal grandmother.  ROS:   Please see the history of present illness.    (+) Shortness of breath (+) Chest tightness (+) Palpitations All other systems reviewed and are negative.  EKGs/Labs/Other Studies Reviewed:    The following studies were reviewed today:  Monitor  06/2021: Predominant underlying rhythm was Sinus Rhythm with avg 71 bpm 21 Supraventricular Tachycardia runs occurred longest lasting 14 beats with an avg rate of 120 bpm. Symptoms were surounding these episodes.  PAC's and PVC's were rare No atrial fibrillation.  First step is to try conservative management - decrease caffeine. If needed, we can try metoprolol. Overall these are benign.   CT Chest  07/10/2020: FINDINGS: Cardiovascular: Coronary artery calcification and  aortic atherosclerotic calcification.  IMPRESSION: 1. No explanation for RIGHT-sided chest pain. 2. No change in RIGHT lung pulmonary nodules. 3. Reduction in size of partially calcified mediastinal andRIGHT hilar lymph nodes. Findings suggest prior/chronic granulomatous disease.  Coronary CT 10/10/2018: 1. Coronary calcium score of 183. This was 81 percentile for age and sex matched control.   2. Normal coronary origin with right dominance.   3. Mild diffuse non-obstructive CAD. Aggressive risk factor modification is recommended. LDL 106 on 02/22/20    ECHO 06/2017:  Study Conclusions   - Left ventricle: The cavity size was normal. Wall thickness was    normal. Systolic function was normal. The estimated ejection    fraction was in the range of 60% to 65%.  - Mitral valve: There was moderate regurgitation.  - Left atrium: The atrium was moderately  dilated.  - Right atrium: The atrium was moderately dilated.   Nuclear stress test 2014:   was low risk but did have upsloping ST segment depression.  EKG:  EKG is personally reviewed. 07/04/2022:  Sinus rhythm. Rate 67 bpm. Nonspecific ST changes. 06/10/2021: Sinus rhythm, rate 62 bpm 04/28/2020: sinus rhythm 62 with no other abnormalities prior as above in HPI nonspecific ST-T wave changes 01/31/2018: nonspecific ST-T wave changes, subtle upsloping noted in the lateral leads.  Recent Labs: 11/12/2021: ALT 32; BUN 17; Creatinine, Ser 0.89; Hemoglobin 13.3; Platelets 226; Potassium 3.6; Sodium 140   Recent Lipid Panel    Component Value Date/Time   CHOL 197 01/07/2019 1016   CHOL 221 (H) 05/20/2015 0814   TRIG 148 01/07/2019 1016   TRIG 88 05/20/2015 0814   HDL 44 01/07/2019 1016   HDL 50 05/20/2015 0814   CHOLHDL 4.5 (H) 01/07/2019 1016   CHOLHDL 3.2 08/25/2015 0746   VLDL 15 08/25/2015 0746   LDLCALC 123 (H) 01/07/2019 1016   LDLCALC 153 (H) 05/20/2015 0814    Physical Exam:    VS:  BP 130/70 (BP Location: Left Arm,  Patient Position: Sitting, Cuff Size: Normal)   Pulse 67   Ht '5\' 4"'$  (1.626 m)   Wt 155 lb (70.3 kg)   LMP  (LMP Unknown)   SpO2 95%   BMI 26.61 kg/m     Wt Readings from Last 3 Encounters:  07/04/22 155 lb (70.3 kg)  03/31/22 148 lb (67.1 kg)  03/17/22 149 lb (67.6 kg)     GEN:  Well nourished, well developed in no acute distress HEENT: Normal NECK: No JVD; No carotid bruits LYMPHATICS: No lymphadenopathy CARDIAC: RRR, no murmurs, rubs, gallops RESPIRATORY:  Clear to auscultation without rales, wheezing or rhonchi  ABDOMEN: Soft, non-tender, non-distended MUSCULOSKELETAL:  No edema; No deformity, excellent still pulses SKIN: Warm and dry NEUROLOGIC:  Alert and oriented x 3 PSYCHIATRIC:  Normal affect, mildly anxious   ASSESSMENT:    1. Precordial pain   2. DOE (dyspnea on exertion)   3. Pre-procedure lab exam      PLAN:    CAD (coronary artery disease) Dyspnea Coronary calcium score was 183, mild diffuse nonobstructive CAD noted on coronary CT scan in 2020.  She is having worsening discomfort now worsening shortness of breath.  Since it has been approximately 3 years, we will go ahead and check a coronary CT scan to see if there has been any progression.  Continue with risk factor modification which includes pravastatin.  80 mg.  Check lipid panel in the future.  Continue with current medical management.  She had struggled with other cholesterol-lowering agent such as Crestor.  Could consider low-dose aspirin.  Checking echocardiogram.   Precordial chest pain As above.   Aortic atherosclerosis ([place]) Continue with diet, exercise, statin therapy.  No changes made.   Leg cramping Excellent pedal pulses noted.  No evidence of peripheral arterial disease.  Also asked her to stretch lower legs after her walks.   Palpitations Occasional pounding heart, fast wakes her up at night.  ZIO has been reviewed.  Atrial tachycardia.  Not interested in metoprolol.  Continue with  caffeine reduction.  Reassurance.  Follow-up:  1 year.  We will follow-up with results of echocardiogram and CT scan.  Medication Adjustments/Labs and Tests Ordered: Current medicines are reviewed at length with the patient today.  Concerns regarding medicines are outlined above.   Orders Placed This Encounter  Procedures   CT  CORONARY MORPH W/CTA COR W/SCORE W/CA W/CM &/OR WO/CM   Basic metabolic panel   EKG 93-ATFT   ECHOCARDIOGRAM COMPLETE   Meds ordered this encounter  Medications   metoprolol tartrate (LOPRESSOR) 100 MG tablet    Sig: Take 1 tablet (100 mg total) by mouth as directed. Take one tablet 2 hours before your CT scan    Dispense:  1 tablet    Refill:  0   Patient Instructions  Medication Instructions:  The current medical regimen is effective;  continue present plan and medications.  *If you need a refill on your cardiac medications before your next appointment, please call your pharmacy*   Lab Work: Please have blood work today  (BMP)  If you have labs (blood work) drawn today and your tests are completely normal, you will receive your results only by: MyChart Message (if you have MyChart) OR A paper copy in the mail If you have any lab test that is abnormal or we need to change your treatment, we will call you to review the results.   Testing/Procedures: Your physician has requested that you have an echocardiogram. Echocardiography is a painless test that uses sound waves to create images of your heart. It provides your doctor with information about the size and shape of your heart and how well your heart's chambers and valves are working. This procedure takes approximately one hour. There are no restrictions for this procedure. Please do NOT wear cologne, perfume, aftershave, or lotions (deodorant is allowed). Please arrive 15 minutes prior to your appointment time.    Your cardiac CT will be scheduled at:   [facility] [address] [place], [place] [address] [phone]  If scheduled at [facility], please arrive at the [facility] and Children's Entrance (Entrance C2) of [place] 30 minutes prior to test start time. You can use the FREE valet parking offered at entrance C (encouraged to control the heart rate for the test)  Proceed to the [facility] Radiology Department (first floor) to check-in and test prep.  All radiology patients and guests should use entrance C2 at [facility], accessed from [facility], even though the hospital's physical address listed is [address].    Please follow these instructions carefully (unless otherwise directed):  On the Night Before the Test: Be sure to Drink plenty of water. Do not consume any caffeinated/decaffeinated beverages or chocolate 12 hours prior to your test. Do not take any antihistamines 12 hours prior to your test.  On the Day of the Test: Drink plenty of water until 1 hour prior to the test. Do not eat any food 1 hour prior to test. You may take your regular medications prior to the test.  Take metoprolol (Lopressor) two hours prior to test. HOLD Furosemide/Hydrochlorothiazide morning of the test. FEMALES- please wear underwire-free bra if available, avoid dresses & tight clothing  After the Test: Drink plenty of water. After receiving IV contrast, you may experience a mild flushed feeling. This is normal. On occasion, you may experience a mild rash up to 24 hours after the test. This is not dangerous. If this occurs, you can take Benadryl 25 mg and increase your fluid intake. If you experience trouble breathing, this can be serious. If it is severe call 911 IMMEDIATELY. If it is mild, please call our office. If you take any of these medications: Glipizide/Metformin, Avandament, Glucavance, please do not take 48 hours after completing test unless  otherwise instructed.  We will call to schedule your test 2-4  weeks out understanding that some insurance companies will need an authorization prior to the service being performed.   For non-scheduling related questions, please contact the cardiac imaging nurse navigator should you have any questions/concerns: Marchia Bond, Cardiac Imaging Nurse Navigator Gordy Clement, Cardiac Imaging Nurse Navigator [facility] Direct Office Dial: [phone]   For scheduling needs, including cancellations and rescheduling, please call [place], [phone].  Follow-Up: At [facility], you and your health needs are our priority.  As part of our continuing mission to provide you with exceptional heart care, we have created designated Provider Care Teams.  These Care Teams include your primary Cardiologist (physician) and Advanced Practice Providers (APPs -  Physician Assistants and Nurse Practitioners) who all work together to provide you with the care you need, when you need it.  We recommend signing up for the patient portal called "MyChart".  Sign up information is provided on this After Visit Summary.  MyChart is used to connect with patients for Virtual Visits (Telemedicine).  Patients are able to view lab/test results, encounter notes, upcoming appointments, etc.  Non-urgent messages can be sent to your provider as well.   To learn more about what you can do with MyChart, go to [email or web address].    Your next appointment:   1 year(s)  The format for your next appointment:   In Person  Provider:   Candee Furbish, MD      Important Information About Sugar         I,Mathew Stumpf,acting as a scribe for Candee Furbish, MD.,have documented all relevant documentation on the behalf of Candee Furbish, MD,as directed by  Candee Furbish, MD while in the presence of Candee Furbish, MD.  I, Candee Furbish, MD, have reviewed all documentation for this visit. The documentation on 07/04/22 for the exam, diagnosis, procedures, and orders  are all accurate and complete.   Signed, Candee Furbish, MD  07/04/2022 3:15 PM    [place]

## 2022-07-04 NOTE — Patient Instructions (Addendum)
Medication Instructions:  The current medical regimen is effective;  continue present plan and medications.  *If you need a refill on your cardiac medications before your next appointment, please call your pharmacy*   Lab Work: Please have blood work today  (BMP)  If you have labs (blood work) drawn today and your tests are completely normal, you will receive your results only by: MyChart Message (if you have MyChart) OR A paper copy in the mail If you have any lab test that is abnormal or we need to change your treatment, we will call you to review the results.   Testing/Procedures: Your physician has requested that you have an echocardiogram. Echocardiography is a painless test that uses sound waves to create images of your heart. It provides your doctor with information about the size and shape of your heart and how well your heart's chambers and valves are working. This procedure takes approximately one hour. There are no restrictions for this procedure. Please do NOT wear cologne, perfume, aftershave, or lotions (deodorant is allowed). Please arrive 15 minutes prior to your appointment time.    Your cardiac CT will be scheduled at:   [facility] [address] [place], [place] [address] [phone]  If scheduled at [facility], please arrive at the [facility] and Children's Entrance (Entrance C2) of [facility] 30 minutes prior to test start time. You can use the FREE valet parking offered at entrance C (encouraged to control the heart rate for the test)  Proceed to the [facility] Radiology Department (first floor) to check-in and test prep.  All radiology patients and guests should use entrance C2 at [facility], accessed from [facility], even though the hospital's physical address listed is [address]    Please follow these instructions carefully (unless otherwise directed):  On the Night Before the Test: Be  sure to Drink plenty of water. Do not consume any caffeinated/decaffeinated beverages or chocolate 12 hours prior to your test. Do not take any antihistamines 12 hours prior to your test.  On the Day of the Test: Drink plenty of water until 1 hour prior to the test. Do not eat any food 1 hour prior to test. You may take your regular medications prior to the test.  Take metoprolol (Lopressor) two hours prior to test. HOLD Furosemide/Hydrochlorothiazide morning of the test. FEMALES- please wear underwire-free bra if available, avoid dresses & tight clothing  After the Test: Drink plenty of water. After receiving IV contrast, you may experience a mild flushed feeling. This is normal. On occasion, you may experience a mild rash up to 24 hours after the test. This is not dangerous. If this occurs, you can take Benadryl 25 mg and increase your fluid intake. If you experience trouble breathing, this can be serious. If it is severe call 911 IMMEDIATELY. If it is mild, please call our office. If you take any of these medications: Glipizide/Metformin, Avandament, Glucavance, please do not take 48 hours after completing test unless otherwise instructed.  We will call to schedule your test 2-4 weeks out understanding that some insurance companies will need an authorization prior to the service being performed.   For non-scheduling related questions, please contact the cardiac imaging nurse navigator should you have any questions/concerns: Marchia Bond, Cardiac Imaging Nurse Navigator Gordy Clement, Cardiac Imaging Nurse Navigator [facility] Direct Office Dial: [phone]   For scheduling needs, including cancellations and rescheduling, please call  [place], [phone].  Follow-Up: At [facility], you and your health needs are our priority.  As part of our continuing mission to provide you with exceptional heart care, we have created designated Provider  Care Teams.  These Care Teams include your primary Cardiologist (physician) and Advanced Practice Providers (APPs -  Physician Assistants and Nurse Practitioners) who all work together to provide you with the care you need, when you need it.  We recommend signing up for the patient portal called "MyChart".  Sign up information is provided on this After Visit Summary.  MyChart is used to connect with patients for Virtual Visits (Telemedicine).  Patients are able to view lab/test results, encounter notes, upcoming appointments, etc.  Non-urgent messages can be sent to your provider as well.   To learn more about what you can do with MyChart, go to [email or web address].    Your next appointment:   1 year(s)  The format for your next appointment:   In Person  Provider:   Candee Furbish, MD      Important Information About Sugar

## 2022-07-05 ENCOUNTER — Encounter: Payer: Self-pay | Admitting: Cardiology

## 2022-07-05 LAB — BASIC METABOLIC PANEL
BUN/Creatinine Ratio: 17 (ref 12–28)
BUN: 14 mg/dL (ref 8–27)
CO2: 23 mmol/L (ref 20–29)
Calcium: 9.7 mg/dL (ref 8.7–10.3)
Chloride: 108 mmol/L — ABNORMAL HIGH (ref 96–106)
Creatinine, Ser: 0.82 mg/dL (ref 0.57–1.00)
Glucose: 107 mg/dL — ABNORMAL HIGH (ref 70–99)
Potassium: 3.7 mmol/L (ref 3.5–5.2)
Sodium: 144 mmol/L (ref 134–144)
eGFR: 76 mL/min/{1.73_m2} (ref >59)

## 2022-07-14 ENCOUNTER — Telehealth (HOSPITAL_COMMUNITY): Payer: Self-pay | Admitting: Emergency Medicine

## 2022-07-14 NOTE — Telephone Encounter (Signed)
Attempted to call patient regarding upcoming cardiac CT appointment. °Left message on voicemail with name and callback number °Benz Vandenberghe RN Navigator Cardiac Imaging °[facility] °[phone] Office °[phone] Cell ° °

## 2022-07-15 ENCOUNTER — Telehealth: Payer: Self-pay | Admitting: Cardiology

## 2022-07-15 ENCOUNTER — Ambulatory Visit (HOSPITAL_COMMUNITY): Payer: Medicare HMO

## 2022-07-15 NOTE — Telephone Encounter (Signed)
Noted - rescheduled to 11/8

## 2022-07-15 NOTE — Telephone Encounter (Signed)
Patient stated she has a very bad cough and wanted RN Pam to know she will be rescheduling her CT Cardiac Morph Test today.

## 2022-07-16 DIAGNOSIS — R062 Wheezing: Secondary | ICD-10-CM | POA: Diagnosis not present

## 2022-07-16 DIAGNOSIS — J069 Acute upper respiratory infection, unspecified: Secondary | ICD-10-CM | POA: Diagnosis not present

## 2022-07-16 DIAGNOSIS — J40 Bronchitis, not specified as acute or chronic: Secondary | ICD-10-CM | POA: Diagnosis not present

## 2022-07-16 DIAGNOSIS — B9789 Other viral agents as the cause of diseases classified elsewhere: Secondary | ICD-10-CM | POA: Diagnosis not present

## 2022-07-18 ENCOUNTER — Encounter: Payer: Self-pay | Admitting: Cardiology

## 2022-07-18 ENCOUNTER — Telehealth: Payer: Self-pay | Admitting: *Deleted

## 2022-07-18 ENCOUNTER — Ambulatory Visit (HOSPITAL_COMMUNITY): Payer: Medicare HMO | Attending: Cardiology

## 2022-07-18 DIAGNOSIS — R072 Precordial pain: Secondary | ICD-10-CM | POA: Insufficient documentation

## 2022-07-18 DIAGNOSIS — I340 Nonrheumatic mitral (valve) insufficiency: Secondary | ICD-10-CM

## 2022-07-18 DIAGNOSIS — R0609 Other forms of dyspnea: Secondary | ICD-10-CM | POA: Insufficient documentation

## 2022-07-18 LAB — ECHOCARDIOGRAM COMPLETE
Area-P 1/2: 3.68 cm2
MV M vel: 5.14 m/s
MV Peak grad: 105.7 mmHg
P 1/2 time: 715 ms
S' Lateral: 2.5 cm

## 2022-07-18 NOTE — Telephone Encounter (Signed)
Excellent.  Moderate mitral valve regurgitation.  Overall reassuring echocardiogram.  Lets repeat echocardiogram in 1 year to monitor her mitral valve regurgitation. Candee Furbish, MD   Order placed for 1 yr.

## 2022-07-21 DIAGNOSIS — J209 Acute bronchitis, unspecified: Secondary | ICD-10-CM | POA: Diagnosis not present

## 2022-07-21 DIAGNOSIS — K219 Gastro-esophageal reflux disease without esophagitis: Secondary | ICD-10-CM | POA: Diagnosis not present

## 2022-07-26 ENCOUNTER — Telehealth (HOSPITAL_COMMUNITY): Payer: Self-pay | Admitting: Emergency Medicine

## 2022-07-26 NOTE — Telephone Encounter (Signed)
Reaching out to patient to offer assistance regarding upcoming cardiac imaging study; pt verbalizes understanding of appt date/time, parking situation and where to check in, pre-test NPO status and medications ordered, and verified current allergies; name and call back number provided for further questions should they arise Tracey Bond RN Navigator Cardiac Imaging [facility] [phone] office [phone] cell  Arrival [address] entrance '100mg'$  metoprolol tartrate Denies iv issues

## 2022-07-26 NOTE — Telephone Encounter (Signed)
Attempted to call patient regarding upcoming cardiac CT appointment. °Left message on voicemail with name and callback number °Rhiley Tarver RN Navigator Cardiac Imaging ° [facility] °[phone] Office °[phone] Cell ° °

## 2022-07-27 ENCOUNTER — Ambulatory Visit (HOSPITAL_COMMUNITY)
Admission: RE | Admit: 2022-07-27 | Discharge: 2022-07-27 | Disposition: A | Discharge status: Home / Self Care | Payer: Medicare HMO | Source: Ambulatory Visit | Attending: Cardiology | Admitting: Cardiology

## 2022-07-27 DIAGNOSIS — R072 Precordial pain: Secondary | ICD-10-CM | POA: Diagnosis not present

## 2022-07-27 MED ORDER — IOHEXOL 350 MG/ML SOLN
100.0000 mL | Freq: Once | INTRAVENOUS | Status: AC | PRN
  Administered 2022-07-27: 100 mL via INTRAVENOUS

## 2022-07-27 MED ORDER — NITROGLYCERIN 0.4 MG SL SUBL
SUBLINGUAL_TABLET | SUBLINGUAL | Status: AC
  Filled 2022-07-27: qty 2

## 2022-07-27 MED ORDER — NITROGLYCERIN 0.4 MG SL SUBL: 0.8000 mg | SUBLINGUAL_TABLET | Freq: Once | SUBLINGUAL | Status: DC

## 2022-07-28 ENCOUNTER — Other Ambulatory Visit: Payer: Self-pay | Admitting: *Deleted

## 2022-07-28 ENCOUNTER — Encounter: Payer: Self-pay | Admitting: Cardiology

## 2022-07-28 DIAGNOSIS — I2510 Atherosclerotic heart disease of native coronary artery without angina pectoris: Secondary | ICD-10-CM

## 2022-07-28 DIAGNOSIS — I700 Atherosclerosis of aorta: Secondary | ICD-10-CM

## 2022-07-28 DIAGNOSIS — E785 Hyperlipidemia, unspecified: Secondary | ICD-10-CM

## 2022-08-21 DIAGNOSIS — K047 Periapical abscess without sinus: Secondary | ICD-10-CM | POA: Diagnosis not present

## 2022-08-21 DIAGNOSIS — K0889 Other specified disorders of teeth and supporting structures: Secondary | ICD-10-CM | POA: Diagnosis not present

## 2022-08-31 ENCOUNTER — Ambulatory Visit: Payer: Medicare HMO | Admitting: Cardiology

## 2022-09-05 ENCOUNTER — Encounter: Payer: Self-pay | Admitting: Cardiology

## 2022-09-05 ENCOUNTER — Ambulatory Visit: Payer: Medicare HMO | Attending: Cardiology | Admitting: Student

## 2022-09-05 DIAGNOSIS — E785 Hyperlipidemia, unspecified: Secondary | ICD-10-CM | POA: Diagnosis not present

## 2022-09-05 LAB — LIPID PANEL
Chol/HDL Ratio: 3.4 ratio (ref 0.0–4.4)
Cholesterol, Total: 175 mg/dL (ref 100–199)
HDL: 51 mg/dL (ref >39)
LDL Chol Calc (NIH): 110 mg/dL — ABNORMAL HIGH (ref 0–99)
Triglycerides: 75 mg/dL (ref 0–149)
VLDL Cholesterol Cal: 14 mg/dL (ref 5–40)

## 2022-09-05 MED ORDER — ROSUVASTATIN CALCIUM 20 MG PO TABS: 20.0000 mg | ORAL_TABLET | Freq: Every day | ORAL | 3 refills | Status: DC

## 2022-09-05 NOTE — Assessment & Plan Note (Addendum)
Assessment:  LDL goal: < 70 mg/dl last LDLc 115 mg/dl (08/2022) Tolerates moderate intensity statins well without any side effects  Intolerance to high intensity statins in the past but willing to try Crestor again (in th past just caused upset stomach)  Patient prefers less pills but willing to try high intensity statins and if needed can add ezetimibe  Plan: Switch pravastatin 80 mg daily to rosuvastatin 20 mg daily  Will repeat lab today and in 2-3 moths  In future will either add ezetimibe or [id number] depends on the lab result

## 2022-09-05 NOTE — Patient Instructions (Signed)
Changes made by your pharmacist Cammy Copa, PharmD at today's visit:    Instructions/Changes  (what do you need to do) Your Notes  (what you did and when you did it)  1.Stop pravastatin 80 mg   2.start taking rosuvastatin 20 mg    3. Repeat lipid lab today to get baseline LDLc      If you have any questions or concerns please use My Chart to send questions or call the office at [phone]

## 2022-09-05 NOTE — Progress Notes (Signed)
Patient ID: Tracey Henson                 DOB: May 23, 1951                    MRN: [id number]      HPI: Tracey Henson is a 71 y.o. female patient referred to lipid clinic by Dr.Skains. PMH is significant for CAD, HDL, Coronary calcium score of 246.  Today patient has no acute concern. She states she does not like to take pills so she follows lifestyle measures so well. Father side family has heart diseases. Patient tolerates pravastatin well without side effects, have been on it for years. Tried rosuvastatin many years ago caused nausea and vomiting. Could not tolerate atorvastatin - muscle pain.We discussed the efficacy of the high intensity statins vs efficacy of low intensity statins. Reviewed other options for lowering LDL cholesterol, including ezetimibe, PCSK-9 inhibitors.  Discussed mechanisms of action, dosing, side effects and potential decreases in LDL cholesterol.    Diet: lots of fish, do not like fried food, do not eat out, daily bothers so trying to stay away.   Exercise:  Walk - 1 mile per day; will be starting chair yoga 3 times per week  Family History: dad died of heart attack at age 75. Mother died of lung cancer at age of 60, sister- heart disease, hypertension, half brother- hypertension, HDL  Social History:  Smoking: never Alcohol: none   Labs: Lipid Panel LDLc 115, HDL 50, TG 136, TC 189 (10/18/2021)    Component Value Date/Time   CHOL 197 01/07/2019 1016   CHOL 221 (H) 05/20/2015 0814   TRIG 148 01/07/2019 1016   TRIG 88 05/20/2015 0814   HDL 44 01/07/2019 1016   HDL 50 05/20/2015 0814   CHOLHDL 4.5 (H) 01/07/2019 1016   CHOLHDL 3.2 08/25/2015 0746   VLDL 15 08/25/2015 0746   LDLCALC 123 (H) 01/07/2019 1016   LDLCALC 153 (H) 05/20/2015 0814   LABVLDL 30 01/07/2019 1016    Past Medical History:  Diagnosis Date   Asthma    seasonal   Baker cyst    left leg   Baker's cyst, left    left leg   Breast cyst    Frequent headaches    GERD  (gastroesophageal reflux disease)    Hearing loss    bilateral   Hypercholesteremia    Muscle cramps    Vertigo     Current Outpatient Medications on File Prior to Visit  Medication Sig Dispense Refill   benzonatate (TESSALON) 200 MG capsule Take 1 capsule (200 mg total) by mouth 3 (three) times daily as needed for cough. 45 capsule 1   Cholecalciferol (VITAMIN D3) 1.25 MG (50000 UT) TABS Take 25 mcg by mouth daily.     cycloSPORINE (RESTASIS) 0.05 % ophthalmic emulsion Place 1 drop into both eyes 2 (two) times daily.     famotidine (PEPCID) 20 MG tablet TAKE 1 TABLET AT BEDTIME 90 tablet 3   metoprolol tartrate (LOPRESSOR) 100 MG tablet Take 1 tablet (100 mg total) by mouth as directed. Take one tablet 2 hours before your CT scan 1 tablet 0   montelukast (SINGULAIR) 10 MG tablet TAKE 1 TABLET BY MOUTH EVERYDAY AT BEDTIME 90 tablet 1   omeprazole (PRILOSEC) 40 MG capsule TAKE 1 CAPSULE EVERY DAY 90 capsule 3   UNABLE TO FIND Med Name: elderberry/zinc twice daily     valACYclovir (VALTREX) 500 MG tablet Take 500  mg by mouth every other day.     No current facility-administered medications on file prior to visit.    Allergies  Allergen Reactions   Vancomycin Rash    Redness of skin    Cefaclor Hives   Codeine     Slows heart rate right down   Crestor [Rosuvastatin] Nausea And Vomiting   Gabapentin     Muscle aches   Oxybutynin Other (See Comments)    Tongue burning    Problem  Hyperlipidemia   Current Medications: pravastatin 80 mg (started in 06/20/2022)  Intolerances: Atorvastatin 40 mg- muscle pain , rosuvastatin 20 mg - nausea vomiting  Risk Factors: Coronary calcium score of 246, CAD,family hx of ischemic heart diseasesCVD 10-year risk score is 8.7%  LDL goal: <70 mg/dl  Lipid Panel LDLc 115, HDL 50, TG 136, TC 189 (10/18/2021)    Hyperlipidemia Assessment:  LDL goal: < 70 mg/dl last LDLc 115 mg/dl (08/2022) Tolerates moderate intensity statins well without any  side effects  Intolerance to high intensity statins in the past but willing to try Crestor again (in th past just caused upset stomach)  Patient prefers less pills but willing to try high intensity statins and if needed can add ezetimibe  Plan: Switch pravastatin 80 mg daily to rosuvastatin 20 mg daily  Will repeat lab today and in 2-3 moths  In future will either add ezetimibe or PCSK9i depends on the lab result   Thank you,  Cammy Copa, Pharm.D [facility] [address] [place], [place] [address]  Phone: [phone]; Fax: [phone]

## 2022-09-23 ENCOUNTER — Telehealth: Payer: Self-pay | Admitting: Student

## 2022-09-23 DIAGNOSIS — E785 Hyperlipidemia, unspecified: Secondary | ICD-10-CM

## 2022-09-23 NOTE — Telephone Encounter (Signed)
  Pt missed a call from [place] and she's returning her call

## 2022-09-26 MED ORDER — NEXLIZET 180-10 MG PO TABS: 1.0000 | ORAL_TABLET | Freq: Every day | ORAL | 3 refills | Status: DC

## 2022-09-26 NOTE — Telephone Encounter (Signed)
Spoke to patient patient will be going back on pravastastin  80 mg as she was able to tolerate it.  As patient does not want to go on any injectable lipid lowering agent; discussed adding Nexlizet to current moderate intensity statins to achieve goal LDLc  Patient is in agreement. Apply for PA for Nexlizet.

## 2022-09-27 NOTE — Telephone Encounter (Signed)
PA approved for Nexlizet (Key: [id number]) Ends on 09/19/2023.  Patient informed via phone.

## 2022-09-29 ENCOUNTER — Encounter: Payer: Self-pay | Admitting: Cardiology

## 2022-09-29 MED ORDER — PRAVASTATIN SODIUM 80 MG PO TABS: 80.0000 mg | ORAL_TABLET | Freq: Every evening | ORAL | 3 refills | Status: DC

## 2022-09-29 MED ORDER — NEXLIZET 180-10 MG PO TABS: 1.0000 | ORAL_TABLET | Freq: Every day | ORAL | 11 refills | Status: DC

## 2022-09-29 NOTE — Telephone Encounter (Signed)
Spoke to patient. . She would like to go back on pravastatin 80 mg daily as she tolerated that well and would like to get enrolled in the [place] so she could afford Nexlizet.   Out of pravastatin 80 mg prescription sent to the mail order pharmacy per patient's request  mail order pharmacy unable to process co-pay card so sent prescription for Nexlizet sent to [facility] ID [id number]  CARD NO. [id number]   [place] Active   BIN [id number]   PCN [id number]   PC GROUP [id number]

## 2022-09-30 ENCOUNTER — Encounter: Payer: Self-pay | Admitting: Cardiology

## 2022-10-10 ENCOUNTER — Ambulatory Visit: Payer: Medicare HMO | Admitting: Internal Medicine

## 2022-11-09 ENCOUNTER — Other Ambulatory Visit: Payer: Self-pay | Admitting: Internal Medicine

## 2022-11-09 DIAGNOSIS — R058 Other specified cough: Secondary | ICD-10-CM

## 2022-11-10 ENCOUNTER — Telehealth: Payer: Self-pay | Admitting: Pharmacist

## 2022-11-10 DIAGNOSIS — E785 Hyperlipidemia, unspecified: Secondary | ICD-10-CM

## 2022-11-10 NOTE — Telephone Encounter (Signed)
Called patient to discuss follow up lab for Uric acid - started Nexlizet in Jan   Patient misunderstood, was only taking Nexlizet instead of both pravastatin 80 mg and Nexlizet. Will wait for follow up [place] till mid May. Patient verbalize understanding that she should be taking both pravastatin 80 mg and Nexlizet 180/23m now on.  Will order uric acid level as patient has been on Nexlizet for 1 month  Will cancel tomorrow appointment with Pharm D and patient to come for lab only.

## 2022-11-11 ENCOUNTER — Other Ambulatory Visit: Payer: Self-pay

## 2022-11-11 ENCOUNTER — Telehealth: Payer: Self-pay | Admitting: Cardiology

## 2022-11-11 ENCOUNTER — Ambulatory Visit: Payer: Medicare HMO

## 2022-11-11 DIAGNOSIS — E785 Hyperlipidemia, unspecified: Secondary | ICD-10-CM

## 2022-11-11 LAB — LIPID PANEL
Chol/HDL Ratio: 3.8 ratio (ref 0.0–4.4)
Cholesterol, Total: 139 mg/dL (ref 100–199)
HDL: 37 mg/dL — ABNORMAL LOW (ref >39)
LDL Chol Calc (NIH): 74 mg/dL (ref 0–99)
Triglycerides: 165 mg/dL — ABNORMAL HIGH (ref 0–149)
VLDL Cholesterol Cal: 28 mg/dL (ref 5–40)

## 2022-11-11 LAB — URIC ACID: Uric Acid: 7.1 mg/dL (ref 3.1–7.9)

## 2022-11-11 MED ORDER — PRAVASTATIN SODIUM 40 MG PO TABS: 40.0000 mg | ORAL_TABLET | Freq: Every evening | ORAL | 3 refills | Status: AC

## 2022-11-11 NOTE — Telephone Encounter (Signed)
Pt c/o medication issue:  1. Name of Medication:  pravastatin (PRAVACHOL) 80 MG tablet  Bempedoic Acid-Ezetimibe (NEXLIZET) 180-10 MG TABS   2. How are you currently taking this medication (dosage and times per day)? Patient was only taking nexlizet  3. Are you having a reaction (difficulty breathing--STAT)? no  4. What is your medication issue? Patient states the pharmacist gave her instructions in January, but when she spoke with her yesterday she learned she was not taking pravastatin. She says she was told yesterday she is supposed to take pravastatin and nexlizet together, but she has not been doing that. She says she researched and everywhere she looked it said not to take the nexzilet with a dose higher that 40 mg of pravastatin.

## 2022-11-11 NOTE — Telephone Encounter (Signed)
Patient responded to in a phone call

## 2022-11-11 NOTE — Telephone Encounter (Signed)
That is correct. Doses of pravastatin should not be higher than '40mg'$  due to inhibition of OATP1B/B3. Patient should resume pravastatin at '40mg'$  daily.  I called pt and spoke with her about this. She will resume pravastatin at '40mg'$  daily. She is aware she can split the tablets in half and I will send rx for lower dose. Patient was appreciative of the call.

## 2022-11-12 ENCOUNTER — Encounter: Payer: Self-pay | Admitting: Cardiology

## 2022-11-24 DIAGNOSIS — Z131 Encounter for screening for diabetes mellitus: Secondary | ICD-10-CM | POA: Diagnosis not present

## 2022-11-24 DIAGNOSIS — Z0000 Encounter for general adult medical examination without abnormal findings: Secondary | ICD-10-CM | POA: Diagnosis not present

## 2022-11-24 DIAGNOSIS — I700 Atherosclerosis of aorta: Secondary | ICD-10-CM | POA: Diagnosis not present

## 2022-11-24 DIAGNOSIS — Z8639 Personal history of other endocrine, nutritional and metabolic disease: Secondary | ICD-10-CM | POA: Diagnosis not present

## 2022-11-24 DIAGNOSIS — R918 Other nonspecific abnormal finding of lung field: Secondary | ICD-10-CM | POA: Diagnosis not present

## 2022-11-24 DIAGNOSIS — R87811 Vaginal high risk human papillomavirus (HPV) DNA test positive: Secondary | ICD-10-CM | POA: Diagnosis not present

## 2022-11-24 DIAGNOSIS — R252 Cramp and spasm: Secondary | ICD-10-CM | POA: Diagnosis not present

## 2022-11-24 DIAGNOSIS — A6000 Herpesviral infection of urogenital system, unspecified: Secondary | ICD-10-CM | POA: Diagnosis not present

## 2022-11-24 DIAGNOSIS — K219 Gastro-esophageal reflux disease without esophagitis: Secondary | ICD-10-CM | POA: Diagnosis not present

## 2022-12-13 DIAGNOSIS — H938X3 Other specified disorders of ear, bilateral: Secondary | ICD-10-CM | POA: Diagnosis not present

## 2022-12-29 DIAGNOSIS — H6501 Acute serous otitis media, right ear: Secondary | ICD-10-CM | POA: Diagnosis not present

## 2022-12-29 DIAGNOSIS — J029 Acute pharyngitis, unspecified: Secondary | ICD-10-CM | POA: Diagnosis not present

## 2022-12-29 DIAGNOSIS — J309 Allergic rhinitis, unspecified: Secondary | ICD-10-CM | POA: Diagnosis not present

## 2022-12-29 DIAGNOSIS — Z79899 Other long term (current) drug therapy: Secondary | ICD-10-CM | POA: Diagnosis not present

## 2023-01-02 DIAGNOSIS — R519 Headache, unspecified: Secondary | ICD-10-CM | POA: Diagnosis not present

## 2023-01-02 DIAGNOSIS — G43909 Migraine, unspecified, not intractable, without status migrainosus: Secondary | ICD-10-CM | POA: Diagnosis not present

## 2023-01-02 DIAGNOSIS — G43109 Migraine with aura, not intractable, without status migrainosus: Secondary | ICD-10-CM | POA: Diagnosis not present

## 2023-02-10 ENCOUNTER — Other Ambulatory Visit: Payer: Self-pay | Admitting: Internal Medicine

## 2023-03-02 ENCOUNTER — Telehealth: Payer: Self-pay | Admitting: Cardiology

## 2023-03-02 NOTE — Telephone Encounter (Signed)
Spoke to the patient, she experiencing pain in right shoulder pain started last week Friday,the pain was so bad she thought about going to the ED. However,  she took two tylenol the pain has subside .Recently pt stated when she raises her right arm, experience  muscles spasms.  She has dizziness once in a while may last 45 seconds. Pt stated she is taken Nexlizet and this may contribute to her right shoulder and muscle spasms. Pt will like appointment with Cardiology, no  appointments with APP and MD are available. Pt does have appointment with PCP next week. Explain ED precaution, pt voiced understanding. Will forward to MD,nurse,and pharm D for advise.

## 2023-03-02 NOTE — Telephone Encounter (Signed)
Patient called stating she has pain in her right shoulder blade that radiates to her neck and goes down her arm. Patient states she has this pain about 90% of the time.  She states the pain is a 3, when it's intense the pain is at a 9.  It sometimes feels like spasm. She has an appt with her PCP next week.  She states she has no chest pain, SOB, she states a couple of days she had some dizziness but it went away quickly. She states she is doing all her normal activities.

## 2023-03-03 ENCOUNTER — Ambulatory Visit: Payer: Medicare HMO | Admitting: Nurse Practitioner

## 2023-03-03 NOTE — Telephone Encounter (Signed)
Called patient to discussed s/e of Nexlizet . N/A LVM

## 2023-03-03 NOTE — Telephone Encounter (Signed)
Pt returned call

## 2023-03-03 NOTE — Telephone Encounter (Signed)
Called patient again and discussed bempedoic acid side effects- tendon rupture, muscle spasm. Reports her pain started when she was scooping ice cream last Friday (02/24/2023) she has marked weakness in that arm and she is not able to use the affected arm. Advised to hold Nexlizet for now. Patient will be seeing PCP for this problem and we will follow up with patient in a week.

## 2023-03-06 DIAGNOSIS — M25511 Pain in right shoulder: Secondary | ICD-10-CM | POA: Diagnosis not present

## 2023-03-06 DIAGNOSIS — M79601 Pain in right arm: Secondary | ICD-10-CM | POA: Diagnosis not present

## 2023-03-06 DIAGNOSIS — M791 Myalgia, unspecified site: Secondary | ICD-10-CM | POA: Diagnosis not present

## 2023-03-06 DIAGNOSIS — G8929 Other chronic pain: Secondary | ICD-10-CM | POA: Diagnosis not present

## 2023-03-08 ENCOUNTER — Telehealth: Payer: Self-pay | Admitting: Cardiology

## 2023-03-08 NOTE — Telephone Encounter (Signed)
Pt sent this via mychart to the scheduling pool:      Only get shortness of breath when on a walk or working in the yard. That really isn't anything new for me though.   Periodic chest pain in the middle but seems to radiate from the shoulder blade area   CP mean Chest pain and SOB is Shortness of breath.  If you are having any of these symptoms we will need to get them over to our triage nurse.  Thank you    ----- Message -----      From: (proxy for Clovis Fredrickson)      Sent:03/07/2023  3:18 PM EDT        To:Delynn Cheron Schaumann   Subject:Appointment Request  Good Afternoon Delissa Can you answer a few question for me ?     1. Are you having CP right now?   2. Are you experiencing any other symptoms (ex. SOB, nausea, vomiting, sweating)?   3. How long have you been experiencing CP?   4. Is your CP continuous or coming and going?  5. Have you taken Nitroglycerin?  ?      Appointment Request From: Carylon Perches  With Provider: Donato Schultz, MD [facility] [address]]  Preferred Date Range: 03/20/2023 - 03/27/2023  Preferred Times: Any Time  Reason for visit: Office Visit  Comments: Pain around shoulder blade, down arm into neck. Have had blood work and just want him to take a look that there is nothing out of the ordinary going on with my heart. Stopping the Nexilet due to high uric acid and serious spasms.

## 2023-03-08 NOTE — Telephone Encounter (Signed)
Spoke with pt and notes pain in shoulder "blade" that radiates up neck and down arm spasm sensation  Also SOB but this is not new just wanted to make sure not heart related with the neck pain Pt recently seen PCP and was referred to orthopedic and this appt is July 8. Pt will see orthopedic and continue to monitor Will forward to Dr Anne Fu fore review and recommendations ./cy

## 2023-03-08 NOTE — Telephone Encounter (Signed)
Called patient to follow up on sever joint pain. Her pain has improved a lot. States she will be seeing ortho on July 8 for this problem and mean while she will taking just pravastatin 40 mg. Trying to get appointment with Dr.Skains. plan to reassess lipid again in 3 months can consider PCSK9i if LDLc elevated.

## 2023-03-14 NOTE — Telephone Encounter (Signed)
Non cardiac pain. Agree with ortho referral Recent CT of heart was reassuring with non obstructive plaque Donato Schultz, MD

## 2023-03-20 DIAGNOSIS — H6123 Impacted cerumen, bilateral: Secondary | ICD-10-CM | POA: Diagnosis not present

## 2023-03-27 ENCOUNTER — Encounter: Payer: Self-pay | Admitting: Orthopedic Surgery

## 2023-03-27 ENCOUNTER — Other Ambulatory Visit (INDEPENDENT_AMBULATORY_CARE_PROVIDER_SITE_OTHER): Payer: Medicare HMO

## 2023-03-27 ENCOUNTER — Ambulatory Visit: Payer: Medicare HMO | Admitting: Orthopedic Surgery

## 2023-03-27 DIAGNOSIS — M5412 Radiculopathy, cervical region: Secondary | ICD-10-CM

## 2023-03-27 DIAGNOSIS — M25551 Pain in right hip: Secondary | ICD-10-CM | POA: Diagnosis not present

## 2023-03-27 DIAGNOSIS — M25511 Pain in right shoulder: Secondary | ICD-10-CM

## 2023-03-27 MED ORDER — PREDNISONE 10 MG PO TABS
10.0000 mg | ORAL_TABLET | Freq: Every day | ORAL | 0 refills | Status: DC
Start: 2023-03-27 — End: 2023-04-24

## 2023-03-28 ENCOUNTER — Encounter: Payer: Self-pay | Admitting: Orthopedic Surgery

## 2023-03-28 NOTE — Progress Notes (Signed)
Office Visit Note   Patient: Tracey Henson           Date of Birth: Jul 15, 1951           MRN: [id number] Visit Date: 03/27/2023              Requested by: Odis Luster, PA-C [address] [place],  [place] [address] PCP: Deatra James, MD  Chief Complaint  Patient presents with   Right Shoulder - Pain   Right Hip - Pain      HPI: Patient is a 72 year old woman who is seen for initial evaluation for 2 separate issues.  Patient states that she is having shoulder pain that starts for the medial scapular border on the right radiates down her shoulder into her arm and hand.  She complains of spasms in the right arm.  Patient also has lateral right hip pain.  Patient is status post anterior cervical discectomy and fusion with Dr. Yetta Barre in 2021.  Assessment & Plan: Visit Diagnoses:  1. Pain in right hip   2. Acute pain of right shoulder   3. Cervical radicular pain     Plan: Will start with a low-dose prednisone 10 mg with breakfast reevaluate in 4 weeks.  Will obtain 2 view radiographs of the cervical and lumbar spine at follow-up.  Follow-Up Instructions: No follow-ups on file.   Ortho Exam  Patient is alert, oriented, no adenopathy, well-dressed, normal affect, normal respiratory effort. Examination patient has tenderness to palpation of the medial scapular border on the right thoracic outlet is not tender to palpation.  Motor strength in both upper extremities is symmetric with no focal weakness with shoulder abduction elbow flexion extension and grip strength.  Examination of both lower extremities she has no pain with internal or external rotation of the hips.  She has a negative straight leg raise no focal motor weakness.  Patient does not have an antalgic gait.  There is no groin pain.  Imaging: XR HIP UNILAT W OR W/O PELVIS 2-3 VIEWS RIGHT  Result Date: 03/27/2023 2 view radiographs of the right hip shows a congruent joint with the left hip.  No subcondylar cysts.  XR  Shoulder Right  Result Date: 03/27/2023 2 view radiographs of the right shoulder shows a previous anterior cervical discectomy and fusion.  The shoulder has a good glenohumeral joint space no evidence of superior migration glenohumeral changes.  No images are attached to the encounter.  Labs: Lab Results  Component Value Date   HGBA1C 5.7 (H) 10/12/2018   ESRSEDRATE 9 03/18/2019   LABURIC 7.1 11/11/2022     Lab Results  Component Value Date   ALBUMIN 4.3 11/12/2021   ALBUMIN 4.2 09/10/2018   ALBUMIN 4.2 07/12/2018    No results found for: "MG" No results found for: "VD25OH"  No results found for: "PREALBUMIN"    Latest Ref Rng & Units 11/12/2021    3:41 PM 03/18/2019   12:25 PM 01/31/2018    1:13 PM  CBC EXTENDED  WBC 4.0 - 10.5 K/uL 7.9  6.2  7.5   RBC 3.87 - 5.11 MIL/uL 4.72  4.52  4.87   Hemoglobin 12.0 - 15.0 g/dL 19.1  47.8  29.5   HCT 36.0 - 46.0 % 41.2  39.6  41.9   Platelets 150 - 400 K/uL 226  219.0  232   NEUT# 1.4 - 7.7 K/uL  3.9    Lymph# 0.7 - 4.0 K/uL  1.8  There is no height or weight on file to calculate BMI.  Orders:  Orders Placed This Encounter  Procedures   XR Shoulder Right   XR HIP UNILAT W OR W/O PELVIS 2-3 VIEWS RIGHT   Meds ordered this encounter  Medications   predniSONE (DELTASONE) 10 MG tablet    Sig: Take 1 tablet (10 mg total) by mouth daily with breakfast.    Dispense:  30 tablet    Refill:  0     Procedures: No procedures performed  Clinical Data: No additional findings.  ROS:  All other systems negative, except as noted in the HPI. Review of Systems  Objective: Vital Signs: LMP  (LMP Unknown)   Specialty Comments:  No specialty comments available.  PMFS History: Patient Active Problem List   Diagnosis Date Noted   CAD (coronary artery disease) 06/10/2021   Atypical chest pain 06/10/2021   Aortic atherosclerosis (HCC) 06/10/2021   Leg cramping 06/10/2021   Palpitations 06/10/2021   Multiple pulmonary  nodules 06/10/2015   Upper airway cough syndrome 06/08/2015   DOE (dyspnea on exertion) 06/08/2015   Hyperlipidemia 04/09/2015   Family history of ischemic heart disease 12/09/2013   Past Medical History:  Diagnosis Date   Asthma    seasonal   Baker cyst    left leg   Baker's cyst, left    left leg   Breast cyst    Frequent headaches    GERD (gastroesophageal reflux disease)    Hearing loss    bilateral   Hypercholesteremia    Muscle cramps    Vertigo     Family History  Problem Relation Age of Onset   Lung cancer Mother 59       smoked   Heart attack Mother    Coronary artery disease Mother    Heart attack Father 87   Coronary artery disease Father    Heart disease Sister    Hypertension Sister    Breast cancer Maternal Aunt    Cancer Maternal Aunt        breast   Stroke Maternal Grandmother 79   Heart attack Maternal Grandfather 68   Coronary artery disease Maternal Grandfather    Cancer Paternal Grandmother    Hypertension Brother     Past Surgical History:  Procedure Laterality Date   ABDOMINAL HYSTERECTOMY  1987   APPENDECTOMY  1963   BILATERAL OOPHORECTOMY     BREAST CYST EXCISION Bilateral pt unsure   BREAST EXCISIONAL BIOPSY Right    x1   BREAST EXCISIONAL BIOPSY Left    x2   BREAST SURGERY  1990,1991   lumpectomy, 3 x R breats, 2 x Left breast   SEPTOPLASTY     TEMPOROMANDIBULAR JOINT SURGERY  1998   Social History   Occupational History    Comment: [employer]  Tobacco Use   Smoking status: Never    Passive exposure: Past   Smokeless tobacco: Never  Vaping Use   Vaping Use: Never used  Substance and Sexual Activity   Alcohol use: No   Drug use: Never   Sexual activity: Not on file

## 2023-04-19 DIAGNOSIS — R5383 Other fatigue: Secondary | ICD-10-CM | POA: Diagnosis not present

## 2023-04-19 DIAGNOSIS — R0789 Other chest pain: Secondary | ICD-10-CM | POA: Diagnosis not present

## 2023-04-19 DIAGNOSIS — R519 Headache, unspecified: Secondary | ICD-10-CM | POA: Diagnosis not present

## 2023-04-19 DIAGNOSIS — M62838 Other muscle spasm: Secondary | ICD-10-CM | POA: Diagnosis not present

## 2023-04-19 DIAGNOSIS — J069 Acute upper respiratory infection, unspecified: Secondary | ICD-10-CM | POA: Diagnosis not present

## 2023-04-24 ENCOUNTER — Ambulatory Visit: Payer: Medicare HMO | Admitting: Orthopedic Surgery

## 2023-04-24 ENCOUNTER — Other Ambulatory Visit: Payer: Self-pay | Admitting: Orthopedic Surgery

## 2023-04-25 DIAGNOSIS — R0789 Other chest pain: Secondary | ICD-10-CM | POA: Diagnosis not present

## 2023-05-08 DIAGNOSIS — H524 Presbyopia: Secondary | ICD-10-CM | POA: Diagnosis not present

## 2023-05-23 ENCOUNTER — Other Ambulatory Visit: Payer: Self-pay | Admitting: Internal Medicine

## 2023-05-28 ENCOUNTER — Other Ambulatory Visit: Payer: Self-pay | Admitting: Orthopedic Surgery

## 2023-05-29 ENCOUNTER — Other Ambulatory Visit (INDEPENDENT_AMBULATORY_CARE_PROVIDER_SITE_OTHER): Payer: Medicare HMO

## 2023-05-29 ENCOUNTER — Encounter: Payer: Self-pay | Admitting: Orthopedic Surgery

## 2023-05-29 ENCOUNTER — Ambulatory Visit: Payer: Medicare HMO | Admitting: Orthopedic Surgery

## 2023-05-29 DIAGNOSIS — M5412 Radiculopathy, cervical region: Secondary | ICD-10-CM

## 2023-05-29 DIAGNOSIS — M5410 Radiculopathy, site unspecified: Secondary | ICD-10-CM

## 2023-05-29 DIAGNOSIS — M79604 Pain in right leg: Secondary | ICD-10-CM

## 2023-05-29 NOTE — Progress Notes (Signed)
Office Visit Note   Patient: Tracey Henson           Date of Birth: 1951-03-22           MRN: [id number] Visit Date: 05/29/2023              Requested by: Deatra James, MD [phone] [place],  [place] [address] PCP: Deatra James, MD  Chief Complaint  Patient presents with   Neck - Follow-up   Lower Back - Follow-up      HPI: Patient is a 72 year old woman who is seen in follow-up for neck and right-sided radicular pain.  Patient states the pain is worse with sitting and has difficulty progressing activities after prolonged sitting.  She states that when she is walking she is fine.  Patient states she had no relief with the prednisone.  Assessment & Plan: Visit Diagnoses:  1. Cervical radicular pain   2. Pain in right leg   3. Radicular pain of right lower extremity     Plan: Will request an MRI scan of the lumbar spine.  Patient may benefit from epidural steroid injections with Dr. Alvester Morin.  Follow-Up Instructions: No follow-ups on file.   Ortho Exam  Patient is alert, oriented, no adenopathy, well-dressed, normal affect, normal respiratory effort. Examination no focal radicular pain or motor weakness in either upper extremity.  Right lower extremity has a negative straight leg raise with no focal motor weakness.  Radicular pain extends from the right buttocks and extends down the lateral right calf.  Imaging: XR Cervical Spine 2 or 3 views  Result Date: 05/29/2023 2 view radiographs of the cervical spine shows previous stable anterior cervical discectomy fusion C5-6.  XR Lumbar Spine 2-3 Views  Result Date: 05/29/2023 2 view radiographs of the lumbar spine shows congruent hips.  There is a degenerative scoliosis L2-L3 with no spondylolisthesis and calcification of the aorta without aneurysm.  No images are attached to the encounter.  Labs: Lab Results  Component Value Date   HGBA1C 5.7 (H) 10/12/2018   ESRSEDRATE 9 03/18/2019   LABURIC 7.1 11/11/2022      Lab Results  Component Value Date   ALBUMIN 4.3 11/12/2021   ALBUMIN 4.2 09/10/2018   ALBUMIN 4.2 07/12/2018    No results found for: "MG" No results found for: "VD25OH"  No results found for: "PREALBUMIN"    Latest Ref Rng & Units 11/12/2021    3:41 PM 03/18/2019   12:25 PM 01/31/2018    1:13 PM  CBC EXTENDED  WBC 4.0 - 10.5 K/uL 7.9  6.2  7.5   RBC 3.87 - 5.11 MIL/uL 4.72  4.52  4.87   Hemoglobin 12.0 - 15.0 g/dL 25.9  56.3  87.5   HCT 36.0 - 46.0 % 41.2  39.6  41.9   Platelets 150 - 400 K/uL 226  219.0  232   NEUT# 1.4 - 7.7 K/uL  3.9    Lymph# 0.7 - 4.0 K/uL  1.8       There is no height or weight on file to calculate BMI.  Orders:  Orders Placed This Encounter  Procedures   XR Cervical Spine 2 or 3 views   XR Lumbar Spine 2-3 Views   No orders of the defined types were placed in this encounter.    Procedures: No procedures performed  Clinical Data: No additional findings.  ROS:  All other systems negative, except as noted in the HPI. Review of Systems  Objective: Vital Signs: LMP  (LMP Unknown)   Specialty Comments:  No specialty comments available.  PMFS History: Patient Active Problem List   Diagnosis Date Noted   CAD (coronary artery disease) 06/10/2021   Atypical chest pain 06/10/2021   Aortic atherosclerosis (HCC) 06/10/2021   Leg cramping 06/10/2021   Palpitations 06/10/2021   Multiple pulmonary nodules 06/10/2015   Upper airway cough syndrome 06/08/2015   DOE (dyspnea on exertion) 06/08/2015   Hyperlipidemia 04/09/2015   Family history of ischemic heart disease 12/09/2013   Past Medical History:  Diagnosis Date   Asthma    seasonal   Baker cyst    left leg   Baker's cyst, left    left leg   Breast cyst    Frequent headaches    GERD (gastroesophageal reflux disease)    Hearing loss    bilateral   Hypercholesteremia    Muscle cramps    Vertigo     Family History  Problem Relation Age of Onset   Lung cancer Mother  75       smoked   Heart attack Mother    Coronary artery disease Mother    Heart attack Father 34   Coronary artery disease Father    Heart disease Sister    Hypertension Sister    Breast cancer Maternal Aunt    Cancer Maternal Aunt        breast   Stroke Maternal Grandmother 44   Heart attack Maternal Grandfather 68   Coronary artery disease Maternal Grandfather    Cancer Paternal Grandmother    Hypertension Brother     Past Surgical History:  Procedure Laterality Date   ABDOMINAL HYSTERECTOMY  1987   APPENDECTOMY  1963   BILATERAL OOPHORECTOMY     BREAST CYST EXCISION Bilateral pt unsure   BREAST EXCISIONAL BIOPSY Right    x1   BREAST EXCISIONAL BIOPSY Left    x2   BREAST SURGERY  1990,1991   lumpectomy, 3 x R breats, 2 x Left breast   SEPTOPLASTY     TEMPOROMANDIBULAR JOINT SURGERY  1998   Social History   Occupational History    Comment: [employer]  Tobacco Use   Smoking status: Never    Passive exposure: Past   Smokeless tobacco: Never  Vaping Use   Vaping status: Never Used  Substance and Sexual Activity   Alcohol use: No   Drug use: Never   Sexual activity: Not on file

## 2023-05-31 ENCOUNTER — Encounter: Payer: Self-pay | Admitting: Orthopedic Surgery

## 2023-06-02 ENCOUNTER — Ambulatory Visit (HOSPITAL_BASED_OUTPATIENT_CLINIC_OR_DEPARTMENT_OTHER): Payer: Medicare HMO | Admitting: Family

## 2023-06-06 ENCOUNTER — Ambulatory Visit
Admission: RE | Admit: 2023-06-06 | Discharge: 2023-06-06 | Disposition: A | Discharge status: Home / Self Care | Payer: Medicare HMO | Source: Ambulatory Visit | Attending: Orthopedic Surgery | Admitting: Orthopedic Surgery

## 2023-06-06 DIAGNOSIS — M5410 Radiculopathy, site unspecified: Secondary | ICD-10-CM

## 2023-06-07 ENCOUNTER — Ambulatory Visit
Admission: RE | Admit: 2023-06-07 | Discharge: 2023-06-07 | Disposition: A | Discharge status: Home / Self Care | Payer: Medicare HMO | Source: Ambulatory Visit | Attending: Orthopedic Surgery | Admitting: Orthopedic Surgery

## 2023-06-07 ENCOUNTER — Other Ambulatory Visit: Payer: Medicare HMO

## 2023-06-07 DIAGNOSIS — M47816 Spondylosis without myelopathy or radiculopathy, lumbar region: Secondary | ICD-10-CM | POA: Diagnosis not present

## 2023-06-19 ENCOUNTER — Ambulatory Visit: Payer: Medicare HMO | Admitting: Orthopedic Surgery

## 2023-06-19 ENCOUNTER — Encounter: Payer: Self-pay | Admitting: Orthopedic Surgery

## 2023-06-19 ENCOUNTER — Other Ambulatory Visit: Payer: Self-pay | Admitting: Pharmacist Clinician (PhC)/ Clinical Pharmacy Specialist

## 2023-06-19 DIAGNOSIS — E785 Hyperlipidemia, unspecified: Secondary | ICD-10-CM | POA: Diagnosis not present

## 2023-06-19 DIAGNOSIS — M48062 Spinal stenosis, lumbar region with neurogenic claudication: Secondary | ICD-10-CM | POA: Diagnosis not present

## 2023-06-19 LAB — HEPATIC FUNCTION PANEL
ALT: 24 [IU]/L (ref 0–32)
AST: 21 [IU]/L (ref 0–40)
Albumin: 4.3 g/dL (ref 3.8–4.8)
Alkaline Phosphatase: 57 [IU]/L (ref 44–121)
Bilirubin Total: 0.6 mg/dL (ref 0.0–1.2)
Bilirubin, Direct: 0.17 mg/dL (ref 0.00–0.40)
Total Protein: 6.4 g/dL (ref 6.0–8.5)

## 2023-06-19 LAB — LIPID PANEL
Chol/HDL Ratio: 3.7 {ratio} (ref 0.0–4.4)
Cholesterol, Total: 165 mg/dL (ref 100–199)
HDL: 45 mg/dL (ref >39)
LDL Chol Calc (NIH): 103 mg/dL — ABNORMAL HIGH (ref 0–99)
Triglycerides: 90 mg/dL (ref 0–149)
VLDL Cholesterol Cal: 17 mg/dL (ref 5–40)

## 2023-06-19 NOTE — Progress Notes (Signed)
Office Visit Note   Patient: Tracey Henson           Date of Birth: [birth year]           MRN: [id number] Visit Date: 06/19/2023              Requested by: Deatra James, MD [phone] [address] [place],  [place] [address] PCP: Deatra James, MD  Chief Complaint  Patient presents with   Lower Back - Follow-up    MRI review      HPI: Patient is a 72 year old woman with persistent lower back pain that radiates to the mid calf on the right.  Patient states the pain is worse when getting up from driving and trying to walk she feels weak when trying to walk.  Assessment & Plan: Visit Diagnoses:  1. Spinal stenosis of lumbar region with neurogenic claudication     Plan: Consult placed for Dr. Alvester Morin to evaluate for epidural steroid injection.  Follow-Up Instructions: Return if symptoms worsen or fail to improve.   Ortho Exam  Patient is alert, oriented, no adenopathy, well-dressed, normal affect, normal respiratory effort. Examination patient has a normal gait she can stand on her toes and heels with no focal motor weakness.  Review of the MRI scan shows stenosis through the lower lumbar spine.  Final review pending with radiology.  Imaging: No results found. No images are attached to the encounter.  Labs: Lab Results  Component Value Date   HGBA1C 5.7 (H) 10/12/2018   ESRSEDRATE 9 03/18/2019   LABURIC 7.1 11/11/2022     Lab Results  Component Value Date   ALBUMIN 4.3 11/12/2021   ALBUMIN 4.2 09/10/2018   ALBUMIN 4.2 07/12/2018    No results found for: "MG" No results found for: "VD25OH"  No results found for: "PREALBUMIN"    Latest Ref Rng & Units 11/12/2021    3:41 PM 03/18/2019   12:25 PM 01/31/2018    1:13 PM  CBC EXTENDED  WBC 4.0 - 10.5 K/uL 7.9  6.2  7.5   RBC 3.87 - 5.11 MIL/uL 4.72  4.52  4.87   Hemoglobin 12.0 - 15.0 g/dL 72.5  36.6  44.0   HCT 36.0 - 46.0 % 41.2  39.6  41.9   Platelets 150 - 400 K/uL 226  219.0  232   NEUT# 1.4 - 7.7 K/uL   3.9    Lymph# 0.7 - 4.0 K/uL  1.8       There is no height or weight on file to calculate BMI.  Orders:  No orders of the defined types were placed in this encounter.  No orders of the defined types were placed in this encounter.    Procedures: No procedures performed  Clinical Data: No additional findings.  ROS:  All other systems negative, except as noted in the HPI. Review of Systems  Objective: Vital Signs: LMP  (LMP Unknown)   Specialty Comments:  No specialty comments available.  PMFS History: Patient Active Problem List   Diagnosis Date Noted   CAD (coronary artery disease) 06/10/2021   Atypical chest pain 06/10/2021   Aortic atherosclerosis (HCC) 06/10/2021   Leg cramping 06/10/2021   Palpitations 06/10/2021   Multiple pulmonary nodules 06/10/2015   Upper airway cough syndrome 06/08/2015   DOE (dyspnea on exertion) 06/08/2015   Hyperlipidemia 04/09/2015   Family history of ischemic heart disease 12/09/2013   Past Medical History:  Diagnosis Date   Asthma    seasonal  Baker cyst    left leg   Baker's cyst, left    left leg   Breast cyst    Frequent headaches    GERD (gastroesophageal reflux disease)    Hearing loss    bilateral   Hypercholesteremia    Muscle cramps    Vertigo     Family History  Problem Relation Age of Onset   Lung cancer Mother 75       smoked   Heart attack Mother    Coronary artery disease Mother    Heart attack Father 81   Coronary artery disease Father    Heart disease Sister    Hypertension Sister    Breast cancer Maternal Aunt    Cancer Maternal Aunt        breast   Stroke Maternal Grandmother 56   Heart attack Maternal Grandfather 68   Coronary artery disease Maternal Grandfather    Cancer Paternal Grandmother    Hypertension Brother     Past Surgical History:  Procedure Laterality Date   ABDOMINAL HYSTERECTOMY  1987   APPENDECTOMY  1963   BILATERAL OOPHORECTOMY     BREAST CYST EXCISION Bilateral pt  unsure   BREAST EXCISIONAL BIOPSY Right    x1   BREAST EXCISIONAL BIOPSY Left    x2   BREAST SURGERY  1990,1991   lumpectomy, 3 x R breats, 2 x Left breast   SEPTOPLASTY     TEMPOROMANDIBULAR JOINT SURGERY  1998   Social History   Occupational History    Comment: [employer]  Tobacco Use   Smoking status: Never    Passive exposure: Past   Smokeless tobacco: Never  Vaping Use   Vaping status: Never Used  Substance and Sexual Activity   Alcohol use: No   Drug use: Never   Sexual activity: Not on file

## 2023-06-22 ENCOUNTER — Telehealth: Payer: Self-pay | Admitting: Physical Medicine and Rehabilitation

## 2023-06-22 ENCOUNTER — Encounter: Payer: Self-pay | Admitting: Cardiology

## 2023-06-22 NOTE — Telephone Encounter (Signed)
Patient called and said that its urgent that she get an appointment because she is in pain badly. CB#[phone]

## 2023-06-22 NOTE — Telephone Encounter (Signed)
Spoke with patient and scheduled OV for 06/26/23.

## 2023-06-23 ENCOUNTER — Other Ambulatory Visit: Payer: Self-pay | Admitting: Family Medicine

## 2023-06-23 DIAGNOSIS — Z1231 Encounter for screening mammogram for malignant neoplasm of breast: Secondary | ICD-10-CM

## 2023-06-24 ENCOUNTER — Inpatient Hospital Stay
Admission: RE | Admit: 2023-06-24 | Discharge: 2023-06-24 | Discharge status: Home / Self Care | Payer: Medicare HMO | Source: Ambulatory Visit | Attending: Family Medicine | Admitting: Family Medicine

## 2023-06-24 DIAGNOSIS — Z1231 Encounter for screening mammogram for malignant neoplasm of breast: Secondary | ICD-10-CM

## 2023-06-26 ENCOUNTER — Ambulatory Visit: Payer: Medicare HMO | Admitting: Physical Medicine and Rehabilitation

## 2023-06-26 ENCOUNTER — Encounter: Payer: Self-pay | Admitting: Physical Medicine and Rehabilitation

## 2023-06-26 DIAGNOSIS — M48061 Spinal stenosis, lumbar region without neurogenic claudication: Secondary | ICD-10-CM | POA: Diagnosis not present

## 2023-06-26 DIAGNOSIS — M47816 Spondylosis without myelopathy or radiculopathy, lumbar region: Secondary | ICD-10-CM | POA: Diagnosis not present

## 2023-06-26 DIAGNOSIS — M5416 Radiculopathy, lumbar region: Secondary | ICD-10-CM

## 2023-06-26 NOTE — Progress Notes (Unsigned)
Tracey Henson - 72 y.o. female MRN [id number]  Date of birth: 10/12/1950  Office Visit Note: Visit Date: 06/26/2023 PCP: Deatra James, MD Referred by: Deatra James, MD  Subjective: Chief Complaint  Patient presents with   Lower Back - Pain   HPI: Tracey Henson is a 72 y.o. female who comes in today per the request of Dr. Aldean Baker for evaluation of chronic, worsening and severe right sided lower back pain radiating to lateral hip, groin and down lateral leg to calf region. Pain ongoing since June, worsens with standing and walking. She describes her pain as shooting and stinging sensation, currently rates as 7 out of 10. Some relief of pain with home exercise regimen, rest and use of medications. Recent lumbar MRI imaging exhibits multi level facet arthropathy, most severe at L5-S1. Right sided lateral recess stenosis at L3-L4 and L5-S1, bilateral at L4-L5. No high grade spinal canal stenosis noted. She was evaluated by Dr. Lajoyce Corners, recent right hip x-ray shows mild degenerative changes, per his notes he is concerned her symptoms are more lumbar spine related. No history of lumbar surgery/injections. Patient denies focal weakness, numbness and tingling. No recent trauma or falls.    Review of Systems  Musculoskeletal:  Positive for back pain and joint pain.  Neurological:  Negative for tingling, sensory change, focal weakness and weakness.  All other systems reviewed and are negative.  Otherwise per HPI.  Assessment & Plan: Visit Diagnoses:    ICD-10-CM   1. Lumbar radiculopathy  M54.16 Ambulatory referral to Physical Medicine Rehab    2. Stenosis of lateral recess of lumbar spine  M48.061 Ambulatory referral to Physical Medicine Rehab    3. Facet arthropathy, lumbar  M47.816 Ambulatory referral to Physical Medicine Rehab       Plan: Findings:  Chronic, worsening and severe right sided lower back pain radiating to lateral hip, groin and down lateral leg to calf region. Patient  continues to have severe pain despite good conservative therapies such as home exercise regimen, rest and use of medications. I discussed recent lumbar MRI with patient today using imaging and spine model. Patients clinical presentation and exam are consistent with L5 nerve pattern.  Next step is to perform diagnostic and hopefully therapeutic right L5 transforaminal epidural steroid injection.  If good relief of pain with injection we can repeat this procedure infrequently as needed. Dr. Alvester Morin at bedside to discuss injection procedure, she has no questions at this time. Although recent right hip x-rays look good, this could be more a labral issue, would consider performing diagnostic right intra-articular hip injection in our office of with Dr. Madelyn Brunner. She does have pain to right lateral hip and groin upon rotation today. No red flag symptoms noted upon exam today.     Meds & Orders: No orders of the defined types were placed in this encounter.   Orders Placed This Encounter  Procedures   Ambulatory referral to Physical Medicine Rehab    Follow-up: Return Right L5 transforaminal epidural steroid injection.   Procedures: No procedures performed      Clinical History: Narrative & Impression CLINICAL DATA:  Low back pain radiating into the right hip for 3-4 months   EXAM: MRI LUMBAR SPINE WITHOUT CONTRAST   TECHNIQUE: Multiplanar, multisequence MR imaging of the lumbar spine was performed. No intravenous contrast was administered.   COMPARISON:  None Available.   FINDINGS: Segmentation:  Standard.   Alignment: No static listhesis. Dextrocurvature of the lumbar spine.  Vertebrae: No acute fracture, evidence of discitis, or aggressive bone lesion.   Conus medullaris and cauda equina: Conus extends to the L1-2 level. Conus and cauda equina appear normal.   Paraspinal and other soft tissues: No acute paraspinal abnormality.   Disc levels:   Disc spaces: Degenerative  disease with disc height loss at L1-2, L2-3, L3-4. Disc desiccation at L4-5 and L5-S1.   T12-L1: No significant disc bulge. No neural foraminal stenosis. No central canal stenosis.   L1-L2: Mild broad-based disc bulge. Mild bilateral facet arthropathy. No foraminal or central canal stenosis.   L2-L3: Mild broad-based disc bulge. Mild bilateral facet arthropathy. No foraminal or central canal stenosis.   L3-L4: Broad-based disc bulge. Moderate bilateral facet arthropathy. Right lateral recess stenosis. No foraminal stenosis. Mild spinal stenosis.   L4-L5: Broad-based disc bulge. Moderate bilateral facet arthropathy. Bilateral lateral recess stenosis. Mild spinal stenosis. No foraminal stenosis.   L5-S1: Mild broad-based disc bulge. Severe bilateral facet arthropathy. Right lateral recess stenosis. Mild spinal stenosis. No foraminal stenosis.   IMPRESSION: 1. Lumbar spine spondylosis as described above. 2. No acute osseous injury of the lumbar spine.     Electronically Signed   By: Elige Ko M.D.   On: 06/26/2023 09:16   She reports that she has never smoked. She has been exposed to tobacco smoke. She has never used smokeless tobacco.  Recent Labs    11/11/22 1046  LABURIC 7.1    Objective:  VS:  HT:    WT:   BMI:     BP:   HR: bpm  TEMP: ( )  RESP:  Physical Exam Vitals and nursing note reviewed.  HENT:     Head: Normocephalic and atraumatic.     Right Ear: External ear normal.     Left Ear: External ear normal.     Nose: Nose normal.     Mouth/Throat:     Mouth: Mucous membranes are moist.  Eyes:     Extraocular Movements: Extraocular movements intact.  Cardiovascular:     Rate and Rhythm: Normal rate.     Pulses: Normal pulses.  Pulmonary:     Effort: Pulmonary effort is normal.  Abdominal:     General: Abdomen is flat. There is no distension.  Musculoskeletal:        General: Tenderness present.     Cervical back: Normal range of motion.      Comments: Patient rises from seated position to standing without difficulty. Good lumbar range of motion. No pain noted with facet loading. 5/5 strength noted with bilateral hip flexion, knee flexion/extension, ankle dorsiflexion/plantarflexion and EHL. No clonus noted bilaterally. No pain upon palpation of greater trochanters. Pain noted to right lateral hip with internal/external rotation. Sensation intact bilaterally. Dysesthesias noted to right L5 dermatome. Negative slump test bilaterally. Ambulates without aid, gait steady.     Skin:    General: Skin is warm and dry.     Capillary Refill: Capillary refill takes less than 2 seconds.  Neurological:     General: No focal deficit present.     Mental Status: She is alert and oriented to person, place, and time.  Psychiatric:        Mood and Affect: Mood normal.        Behavior: Behavior normal.     Ortho Exam  Imaging: No results found.  Past Medical/Family/Surgical/Social History: Medications & Allergies reviewed per EMR, new medications updated. Patient Active Problem List   Diagnosis Date Noted   CAD (  coronary artery disease) 06/10/2021   Atypical chest pain 06/10/2021   Aortic atherosclerosis (HCC) 06/10/2021   Leg cramping 06/10/2021   Palpitations 06/10/2021   Multiple pulmonary nodules 06/10/2015   Upper airway cough syndrome 06/08/2015   DOE (dyspnea on exertion) 06/08/2015   Hyperlipidemia 04/09/2015   Family history of ischemic heart disease 12/09/2013   Past Medical History:  Diagnosis Date   Asthma    seasonal   Baker cyst    left leg   Baker's cyst, left    left leg   Breast cyst    Frequent headaches    GERD (gastroesophageal reflux disease)    Hearing loss    bilateral   Hypercholesteremia    Muscle cramps    Vertigo    Family History  Problem Relation Age of Onset   Lung cancer Mother 71       smoked   Heart attack Mother    Coronary artery disease Mother    Heart attack Father 65   Coronary  artery disease Father    Heart disease Sister    Hypertension Sister    Breast cancer Maternal Aunt    Cancer Maternal Aunt        breast   Stroke Maternal Grandmother 50   Heart attack Maternal Grandfather 68   Coronary artery disease Maternal Grandfather    Cancer Paternal Grandmother    Hypertension Brother    Past Surgical History:  Procedure Laterality Date   ABDOMINAL HYSTERECTOMY  1987   APPENDECTOMY  1963   BILATERAL OOPHORECTOMY     BREAST CYST EXCISION Bilateral pt unsure   BREAST EXCISIONAL BIOPSY Right    x1   BREAST EXCISIONAL BIOPSY Left    x2   BREAST SURGERY  1990,1991   lumpectomy, 3 x R breats, 2 x Left breast   SEPTOPLASTY     TEMPOROMANDIBULAR JOINT SURGERY  1998   Social History   Occupational History    Comment: [employer]  Tobacco Use   Smoking status: Never    Passive exposure: Past   Smokeless tobacco: Never  Vaping Use   Vaping status: Never Used  Substance and Sexual Activity   Alcohol use: No   Drug use: Never   Sexual activity: Not on file

## 2023-06-26 NOTE — Progress Notes (Unsigned)
Functional Pain Scale - descriptive words and definitions  Distressing (6)    Pain is present/unable to complete most ADLs limited by pain/sleep is difficult and active distraction is only marginal. Moderate range order  Average Pain 8  Lower back pain on the right with radiation in the right leg to the calf

## 2023-06-27 ENCOUNTER — Other Ambulatory Visit (HOSPITAL_COMMUNITY): Payer: Self-pay

## 2023-06-27 ENCOUNTER — Telehealth: Payer: Self-pay | Admitting: Pharmacist

## 2023-06-27 ENCOUNTER — Telehealth: Payer: Self-pay | Admitting: Pharmacy Technician

## 2023-06-27 NOTE — Telephone Encounter (Signed)
Pharmacy Patient Advocate Encounter  Received notification from [facility] that Prior Authorization for repatha has been APPROVED from 09/19/22 to 09/18/24. Ran test claim, Copay is $45.00- one month. This test claim was processed through [facility]- copay amounts may vary at other pharmacies due to pharmacy/plan contracts, or as the patient moves through the different stages of their insurance plan.   PA #/Case ID/Reference #: [id number]

## 2023-06-27 NOTE — Telephone Encounter (Signed)
PA for [id number] requested

## 2023-06-27 NOTE — Telephone Encounter (Signed)
Pharmacy Patient Advocate Encounter   Received notification from Pt Calls Messages that prior authorization for repatha is required/requested.   Insurance verification completed.   The patient is insured through [place] .   Per test claim: PA required; PA submitted to [facility] via CoverMyMeds Key/confirmation #/EOC [id number] Status is pending

## 2023-06-29 ENCOUNTER — Encounter: Payer: Self-pay | Admitting: Student

## 2023-06-29 ENCOUNTER — Ambulatory Visit: Payer: Medicare HMO | Attending: Internal Medicine | Admitting: Student

## 2023-06-29 DIAGNOSIS — E785 Hyperlipidemia, unspecified: Secondary | ICD-10-CM | POA: Diagnosis not present

## 2023-06-29 MED ORDER — REPATHA SURECLICK 140 MG/ML ~~LOC~~ SOAJ: 140.0000 mg | SUBCUTANEOUS | 2 refills | Status: DC

## 2023-06-29 NOTE — Telephone Encounter (Signed)
Patient's questions were answered during the appointment.

## 2023-06-29 NOTE — Assessment & Plan Note (Signed)
Assessment:  LDL goal: < 70 mg/dl last LDLc 960 mg/dl (45/4098) Tolerates moderate intensity statins well without any side effects  Intolerance to high intensity statins and Nexlizet   Patient did not wanted to go on injectables before but now that she is intolerant to most oral agent willing to try Repatha - friend will be helping with injection until she is ready to do it own her own  Plan: Continue taking pravastatin 40 mg daily and start taking Repatha 140 mg SQ Q14D Will repeat lab today and in 2-3 moths ( patient prefers Mid Jan due to [place] Season)

## 2023-06-29 NOTE — Patient Instructions (Signed)
Your Results:             Your most recent labs Goal  Total Cholesterol 165 < 200  Triglycerides 90 < 150  HDL (happy/good cholesterol) 45 > 40  LDL (lousy/bad cholesterol 103 < 70   Medication changes: Continue taking pravastatin and start taking Repatha 140 mg every 14 days      Repatha is a cholesterol medication that improved your body's ability to get rid of "bad cholesterol" known as LDL. It can lower your LDL up to 60%! It is an injection that is given under the skin every 2 weeks. The medication often requires a prior authorization from your insurance company. We will take care of submitting all the necessary information to your insurance company to get it approved. The most common side effects of Repatha include runny nose, symptoms of the common cold, rarely flu or flu-like symptoms, back/muscle pain in about 3-4% of the patients, and redness, pain, or bruising at the injection site.   Lab orders: We want to repeat labs after 2-3 months.  We will send you a lab order to remind you once we get closer to that time.

## 2023-06-29 NOTE — Progress Notes (Signed)
Patient ID: Tracey Henson                 DOB: [birth year]                    MRN: [id number]      HPI: Kashira Behunin is a 72 y.o. female patient referred to lipid clinic by Dr.Skains. PMH is significant for CAD, HDL, Coronary calcium score of 246. Patient unable to tolerate high intensity statins and Nexlizet due to muscle pain. Nexlizet caused joint pain - shoulder problem. Patient was not ready to go on any injectables initially but she has friend who could do injections for her.  Patient presented today with her friend for lipid clinic we have received PA approval for Repatha from her insurance and we discussed mechanisms of action, dosing, side effects and potential decreases in LDL cholesterol. Educated patient and her friend for appropriate administration techniques.    Diet: heart healthy diet mainly plant based  Exercise: unable to exercise due to pain in joints   Family History: dad died of heart attack at age 50. Mother died of lung cancer at age of 70, sister- heart disease, hypertension, half brother- hypertension, HDL  Social History:  Smoking: never Alcohol: none   Labs:     Component Value Date/Time   CHOL 165 06/19/2023 1004   CHOL 221 (H) 05/20/2015 0814   TRIG 90 06/19/2023 1004   TRIG 88 05/20/2015 0814   HDL 45 06/19/2023 1004   HDL 50 05/20/2015 0814   CHOLHDL 3.7 06/19/2023 1004   CHOLHDL 3.2 08/25/2015 0746   VLDL 15 08/25/2015 0746   LDLCALC 103 (H) 06/19/2023 1004   LDLCALC 153 (H) 05/20/2015 0814   LABVLDL 17 06/19/2023 1004    Past Medical History:  Diagnosis Date   Asthma    seasonal   Baker cyst    left leg   Baker's cyst, left    left leg   Breast cyst    Frequent headaches    GERD (gastroesophageal reflux disease)    Hearing loss    bilateral   Hypercholesteremia    Muscle cramps    Vertigo     Current Outpatient Medications on File Prior to Visit  Medication Sig Dispense Refill   Cholecalciferol (VITAMIN D3) 1.25 MG (50000 UT)  TABS Take 25 mcg by mouth daily.     montelukast (SINGULAIR) 10 MG tablet TAKE 1 TABLET BY MOUTH EVERYDAY AT BEDTIME 90 tablet 0   omeprazole (PRILOSEC) 40 MG capsule TAKE 1 CAPSULE EVERY DAY 90 capsule 3   pravastatin (PRAVACHOL) 40 MG tablet Take 1 tablet (40 mg total) by mouth every evening. 90 tablet 3   UNABLE TO FIND Med Name: elderberry/zinc twice daily     valACYclovir (VALTREX) 500 MG tablet Take 500 mg by mouth every other day.     No current facility-administered medications on file prior to visit.    Allergies  Allergen Reactions   Vancomycin Rash    Redness of skin    Cefaclor Hives   Codeine     Slows heart rate right down   Crestor [Rosuvastatin] Nausea And Vomiting   Gabapentin     Muscle aches   Oxybutynin Other (See Comments)    Tongue burning    Problem  Hyperlipidemia   Current Medications: pravastatin 40 mg  Intolerances: Atorvastatin 40 mg- muscle pain , rosuvastatin 20 mg - nausea vomiting Nexlizet- joint issues (shoulder) Risk Factors: Coronary calcium score  of 246, CAD,family hx of ischemic heart diseasesCVD 10-year risk score is 8.7%  LDL goal: <70 mg/dl  Lipid Panel LDLc 161, HDL 50, TG 136, TC 189 (10/18/2021) recent LDLc 103 HDL 45, TG 90 TC 165 (05/2023)     Hyperlipidemia Assessment:  LDL goal: < 70 mg/dl last LDLc 096 mg/dl (12/5407) Tolerates moderate intensity statins well without any side effects  Intolerance to high intensity statins and Nexlizet   Patient did not wanted to go on injectables before but now that she is intolerant to most oral agent willing to try Repatha - friend will be helping with injection until she is ready to do it own her own  Plan: Continue taking pravastatin 40 mg daily and start taking Repatha 140 mg SQ Q14D Will repeat lab today and in 2-3 moths ( patient prefers Mid Jan due to [place] Season)   Thank you,  Carmela Hurt, Pharm.D [facility] [address], [place], [place] [address]  Phone: [phone]; Fax: [phone]

## 2023-07-05 ENCOUNTER — Other Ambulatory Visit: Payer: Self-pay

## 2023-07-05 ENCOUNTER — Ambulatory Visit: Payer: Medicare HMO | Admitting: Physical Medicine and Rehabilitation

## 2023-07-05 DIAGNOSIS — M5416 Radiculopathy, lumbar region: Secondary | ICD-10-CM

## 2023-07-05 MED ORDER — METHYLPREDNISOLONE ACETATE 40 MG/ML IJ SUSP
40.0000 mg | Freq: Once | INTRAMUSCULAR | Status: AC
Start: 2023-07-05 — End: 2023-07-05
  Administered 2023-07-05: 40 mg

## 2023-07-05 NOTE — Patient Instructions (Signed)

## 2023-07-05 NOTE — Progress Notes (Signed)
Functional Pain Scale - descriptive words and definitions  Unmanageable (7)  Pain interferes with normal ADL's/nothing seems to help/sleep is very difficult/active distractions are very difficult to concentrate on. Severe range order  Average Pain 7   +Driver, -BT, -Dye Allergies. Yes, no, no  178/70 67 97%

## 2023-07-06 ENCOUNTER — Encounter: Payer: Self-pay | Admitting: Physical Medicine and Rehabilitation

## 2023-07-06 DIAGNOSIS — H938X3 Other specified disorders of ear, bilateral: Secondary | ICD-10-CM | POA: Diagnosis not present

## 2023-07-10 DIAGNOSIS — H903 Sensorineural hearing loss, bilateral: Secondary | ICD-10-CM | POA: Diagnosis not present

## 2023-07-11 NOTE — Progress Notes (Addendum)
Cardiology Office Note:  .   Date:  07/25/2023  ID:  Tracey Henson, DOB 1951-03-09, MRN [id number] PCP: Clemetine Marker  [facility] Providers Cardiologist:  Donato Schultz, MD    History of Present Illness: .   Tracey Henson is a 72 y.o. female with history of CAD, HLD, coronary calcium score 246 on repatha, PAT.  Patient says her heart beats really fast and wakes her up makes her dizzy. She has to get up and walk around. Worse when she lays down and lasts 5 hrs. Occurs about twice a month. Very scary for her. Drinks one cup coffee a day and a piece of chocolate. She doesn't snore. Walks 2-3 miles daily.   ROS:    Studies Reviewed: Marland Kitchen    EKG Interpretation Date/Time:  Tuesday July 25 2023 07:45:24 EST Ventricular Rate:  59 PR Interval:  128 QRS Duration:  74 QT Interval:  434 QTC Calculation: 429 R Axis:   72  Text Interpretation: Sinus bradycardia Nonspecific T wave abnormality When compared with ECG of 31-Jan-2018 13:04, Nonspecific T wave abnormality now evident in Anterior leads Confirmed by Jacolyn Reedy [id number] on 07/25/2023 7:55:11 AM    Prior CV Studies:    Echo 06/2023 IMPRESSIONS     1. Left ventricular ejection fraction, by estimation, is 60 to 65%. The  left ventricle has normal function. The left ventricle has no regional  wall motion abnormalities. There is mild left ventricular hypertrophy.  Left ventricular diastolic parameters  were normal. The average left ventricular global longitudinal strain is  -17.2 %. The global longitudinal strain is normal.   2. Right ventricular systolic function is normal. The right ventricular  size is normal. Tricuspid regurgitation signal is inadequate for assessing  PA pressure.   3. The mitral valve is normal in structure. Mild mitral valve  regurgitation. No evidence of mitral stenosis.   4. The aortic valve is tricuspid. Aortic valve regurgitation is trivial.  No aortic stenosis is present.   5. The  inferior vena cava is normal in size with greater than 50%  respiratory variability, suggesting right atrial pressure of 3 mmHg.   Comparison(s): A prior study was performed on 07/18/2022. Moderate MR is  now mild otherwise see report for details.    Coronary CTA 07/2022 IMPRESSION: 1. Coronary calcium score of 246. This was 31 percentile for age and sex matched control.   2. Normal coronary origin with right dominance.   3. Mild proximal LAD calcified stenosis 25-49%, minimal 0-24% RCA stenosis.   4.  Aortic atherosclerosis.   5.  Recommend goal directed medical therapy.   Electronically Signed: By: Donato Schultz M.D. On: 07/27/2022 17:43  Echo 06/2022 IMPRESSIONS     1. Left ventricular ejection fraction by 3D volume is 74 %. The left  ventricle has normal function. The left ventricle has no regional wall  motion abnormalities. Left ventricular diastolic parameters were normal.  The average left ventricular global  longitudinal strain is -20.8 %. The global longitudinal strain is normal.   2. Right ventricular systolic function is normal. The right ventricular  size is mildly enlarged. There is normal pulmonary artery systolic  pressure. The estimated right ventricular systolic pressure is 33.0 mmHg.   3. Left atrial size was moderately dilated.   4. Right atrial size was mildly dilated.   5. The mitral valve is normal in structure. Moderate mitral valve  regurgitation. No evidence of mitral stenosis.   6. The aortic  valve is normal in structure. Aortic valve regurgitation is  mild. No aortic stenosis is present.   7. The inferior vena cava is normal in size with greater than 50%  respiratory variability, suggesting right atrial pressure of 3 mmHg.   FINDINGS   Left Ventricle: Left ventricular ejection fraction by 3D volume is 74 %.  The left ventricle has normal function. The left ventricle has no regional  wall motion abnormalities. The average left ventricular  global  longitudinal strain is -20.8 %. The global   longitudinal strain is normal. The left ventricular internal cavity size  was normal in size. There is no left ventricular hypertrophy. Left  ventricular diastolic parameters were normal.      Monitor  06/2021: Predominant underlying rhythm was Sinus Rhythm with avg 71 bpm 21 Supraventricular Tachycardia runs occurred longest lasting 14 beats with an avg rate of 120 bpm. Symptoms were surounding these episodes.  PAC's and PVC's were rare No atrial fibrillation.  First step is to try conservative management - decrease caffeine. If needed, we can try metoprolol. Overall these are benign.    CT Chest  07/10/2020: FINDINGS: Cardiovascular: Coronary artery calcification and aortic atherosclerotic calcification.   IMPRESSION: 1. No explanation for RIGHT-sided chest pain. 2. No change in RIGHT lung pulmonary nodules. 3. Reduction in size of partially calcified mediastinal andRIGHT hilar lymph nodes. Findings suggest prior/chronic granulomatous disease.   Coronary CT 10/10/2018: 1. Coronary calcium score of 183. This was 24 percentile for age and sex matched control.   2. Normal coronary origin with right dominance.   3. Mild diffuse non-obstructive CAD. Aggressive risk factor modification is recommended. LDL 106 on 02/22/20      ECHO 06/2017:  Study Conclusions   - Left ventricle: The cavity size was normal. Wall thickness was    normal. Systolic function was normal. The estimated ejection    fraction was in the range of 60% to 65%.  - Mitral valve: There was moderate regurgitation.  - Left atrium: The atrium was moderately dilated.  - Right atrium: The atrium was moderately dilated.    Nuclear stress test 2014:   was low risk but did have upsloping ST segment depression.  Risk Assessment/Calculations:             Physical Exam:   VS:  BP 128/66   Pulse (!) 58   Ht 5\' 4"  (1.626 m)   Wt 152 lb (68.9 kg)   LMP  (LMP  Unknown)   SpO2 98%   BMI 26.09 kg/m    Wt Readings from Last 3 Encounters:  07/25/23 152 lb (68.9 kg)  07/17/23 150 lb (68 kg)  07/04/22 155 lb (70.3 kg)    GEN: Well nourished, well developed in no acute distress NECK: No JVD; No carotid bruits CARDIAC:  RRR, no murmurs, rubs, gallops RESPIRATORY:  Clear to auscultation without rales, wheezing or rhonchi  ABDOMEN: Soft, non-tender, non-distended EXTREMITIES:  No edema; No deformity   ASSESSMENT AND PLAN: .    CAD elevated calcium score 183 mild diffuse non-obstructive disease CTA 2020, calcium score 246 mild LAD and RCA stenosis CTA 07/2022 now on repatha(only has had 2 doses)and pravastatin. -LDL 103 05/2023-give above meds more time, followed by lipid clinic.  PAT/PAC's/PVC's with history of palpitations. Palpitations have worsened and sometimes last 5 hrs-always at night. Will check labs including TSH. Cut back on chocolate. Reviewed how to bear down to try to self convert, toprol xl 25 mg 1/2 at bedtime. She  has baseline bradycardia. If she doesn't tolerate place Zio and possible referral to EPS.   Moderate MR on echo 07/2022-mild on echo 06/2023  Family history early CAD-13 siblings with CAD          Dispo: f/u in 1 yr or sooner if needed.  Signed, Jacolyn Reedy, PA-C

## 2023-07-13 ENCOUNTER — Telehealth: Payer: Self-pay | Admitting: Physical Medicine and Rehabilitation

## 2023-07-13 ENCOUNTER — Other Ambulatory Visit: Payer: Self-pay | Admitting: Physical Medicine and Rehabilitation

## 2023-07-13 DIAGNOSIS — H524 Presbyopia: Secondary | ICD-10-CM | POA: Diagnosis not present

## 2023-07-13 NOTE — Telephone Encounter (Signed)
We will need to see her back in office. Could try L5-S1 interlaminar epidural steroid injection.

## 2023-07-17 ENCOUNTER — Ambulatory Visit: Payer: Medicare HMO | Admitting: Diagnostic Neuroimaging

## 2023-07-17 ENCOUNTER — Other Ambulatory Visit: Payer: Self-pay | Admitting: Diagnostic Neuroimaging

## 2023-07-17 ENCOUNTER — Encounter: Payer: Self-pay | Admitting: Diagnostic Neuroimaging

## 2023-07-17 VITALS — BP 145/64 | HR 60 | Ht 64.0 in | Wt 150.0 lb

## 2023-07-17 DIAGNOSIS — G43109 Migraine with aura, not intractable, without status migrainosus: Secondary | ICD-10-CM

## 2023-07-17 MED ORDER — AMITRIPTYLINE HCL 25 MG PO TABS: 25.0000 mg | ORAL_TABLET | Freq: Every day | ORAL | 3 refills | Status: DC

## 2023-07-17 MED ORDER — NURTEC 75 MG PO TBDP: 75.0000 mg | ORAL_TABLET | Freq: Every day | ORAL | 6 refills | Status: DC | PRN

## 2023-07-17 NOTE — Patient Instructions (Signed)
MIGRAINE WITH AURA (2-12 HA per month)  MIGRAINE PREVENTION  LIFESTYLE CHANGES -Stop or avoid smoking -Decrease or avoid caffeine / alcohol -Eat and sleep on a regular schedule -Exercise several times per week - start amitriptyline 25mg  at bedtime  MIGRAINE RESCUE  - headache diary  - ibuprofen, tylenol as needed - avoid TRIPTANS (h/o CAD) - rimegepant (Nurtec) 75mg  as needed for breakthrough headache; max 8 per month  INSOMNIA - sleep hygiene reviewed - consider sleep psychology evaluation for anxiety

## 2023-07-17 NOTE — Progress Notes (Signed)
[facility]  PATIENT: Tracey Henson DOB: [birth year]  REFERRING CLINICIAN: Odis Luster, PA-C  HISTORY FROM: patient  REASON FOR VISIT: new consult    HISTORICAL  CHIEF COMPLAINT:  Chief Complaint  Patient presents with   New Patient (Initial Visit)    Rm 7, here alone Pt is here for migraines and muscle spasms. Pt states she has about 2-3 migraines per week, start with loss of peripheral vision it can be one sided or both.  States muscle spasms happen on the right side of the body, between the shoulder blades, and mostly on the right arm which radiates into her neck. Pt states she also sometimes has what feels like pain/needles on her left foot 3rd and 4th toe.     HISTORY OF PRESENT ILLNESS:   UPDATE (07/17/23, VRP): Since last visit, continues with HA (2-12 times per month; preceded by visual aura; sens light; throbbing global HA; lasting hours; uses tylenol and lays down). More stress lately. Son passed away in Aug 16, 2020. Daughter had MI and CABG recently. Right shoulder and arm pain and spasm. Left toes numbness continues. Sleep is intermittent and difficult. Patient still dog sitting for clients.   NEW HPI (10/12/18): She presents today with concerns of vision changes and headaches for the past 6 months. She feels that her vision is like a kaleidoscope and seconds later she will have a headache. Vision changes are always bilateral. She sees shapes, color (usually silver) and lights. Headache is described as a really bad throbbing sensation. She is sensitive to light, unsure of sound. She is nauseated with headache. Relieved by rest and dark room. Unable to identify triggers. Headaches are always during the day. Outside of these episodes she rarely has a sinus headache. She drinks wine on occasion but has not noticed that this stimulates headache. Occurences are at least 3 times a week. She has not had any headaches in the past two weeks. She usually takes one tablet  of ibuprofen with each headache. She is unsure of any family history of migraines. Eye exam in summer of 08-16-2018 was reportedly normal.  She does not sleep well. She usually tries to go to bed around 10pm. It takes at least an hour to fall asleep. She is up and down throughout the night. She states that her mind starts thinking about things she needs to do. She denies concerns of anxiety or depression. She does not snore. She occasionally has dry mouth when waking. She reports intermittent daytime sleepiness. She has never had a sleep study.  She continues to have numbness and tingling of her left 3rd and 4th toes. It is "much worse" over the past year. Electrical sensation comes and goes daily. She is not using any treatments. She has not had an NCS/EMG.    PRIOR HPI (09/06/17): 72 year old female here for evaluation of numbness and tingling.  Summer 08-16-17 patient woke up, felt normal, but when she sat up at the side of the bed and placed pressure on her feet she felt numbness and tingling in her left toes 2-4.  After some time symptoms would resolve.  This continued for several months.  Over time this is become more constant.  She feels more sensation especially if she is standing up for a long time.  No symptoms above her ankle.  No symptoms in her right foot.  In the last few months she has had some intermittent numbness and tingling sensation, electrical sensation, sometimes affecting her nose,  sometimes in her back of her neck.  Symptoms she feels like something is falling on her skin and she rubs her nose or scratches her neck.  This is intermittent and random.  Patient also has some stiffness in her neck with "popping and cracking" sound when she turns her head neck.  No recent traumas injuries or accidents.  No recent infections.  Patient has chronic insomnia, with overactivity of thoughts and mind.  She has had more fatigue lately.  More constitutional symptoms.   REVIEW OF SYSTEMS: Full 14  system review of systems performed and negative with exception of: as per HPI.   ALLERGIES: Allergies  Allergen Reactions   Vancomycin Rash    Redness of skin    Atorvastatin Other (See Comments)   Bempedoic Acid-Ezetimibe Other (See Comments)   Cefaclor Hives   Codeine     Slows heart rate right down   Crestor [Rosuvastatin] Nausea And Vomiting   Gabapentin     Muscle aches   Oxybutynin Other (See Comments)    Tongue burning    HOME MEDICATIONS: Outpatient Medications Prior to Visit  Medication Sig Dispense Refill   Cholecalciferol (VITAMIN D3) 1.25 MG (50000 UT) TABS Take 25 mcg by mouth daily.     cyanocobalamin 100 MCG tablet Take 100 mcg by mouth daily.     Evolocumab (REPATHA SURECLICK) 140 MG/ML SOAJ Inject 140 mg into the skin every 14 (fourteen) days. 2 mL 2   Magnesium Gluconate 250 MG TABS Take 12.5 mg by mouth daily.     methocarbamol (ROBAXIN) 500 MG tablet Take 500 mg by mouth 4 (four) times daily.     montelukast (SINGULAIR) 10 MG tablet TAKE 1 TABLET BY MOUTH EVERYDAY AT BEDTIME 90 tablet 0   omeprazole (PRILOSEC) 40 MG capsule TAKE 1 CAPSULE EVERY DAY 90 capsule 3   pravastatin (PRAVACHOL) 40 MG tablet Take 1 tablet (40 mg total) by mouth every evening. 90 tablet 3   UNABLE TO FIND Med Name: elderberry/zinc twice daily     valACYclovir (VALTREX) 500 MG tablet Take 500 mg by mouth every other day.     Zinc Gluconate 50 MG CAPS Take 50 mg by mouth daily.     cetirizine (ZYRTEC) 10 MG chewable tablet Chew 10 mg by mouth daily.     ZINC GLUCONATE-VITAMIN C MT Take 1 tablet by mouth daily.     Facility-Administered Medications Prior to Visit  Medication Dose Route Frequency Provider Last Rate Last Admin   methylPREDNISolone acetate (DEPO-MEDROL) injection 40 mg  40 mg Other Once         PAST MEDICAL HISTORY: Past Medical History:  Diagnosis Date   Asthma    seasonal   Baker cyst    left leg   Baker's cyst, left    left leg   Breast cyst    Frequent  headaches    GERD (gastroesophageal reflux disease)    Hearing loss    bilateral   Hypercholesteremia    Muscle cramps    Vertigo     PAST SURGICAL HISTORY: Past Surgical History:  Procedure Laterality Date   ABDOMINAL HYSTERECTOMY  1987   APPENDECTOMY  1963   BILATERAL OOPHORECTOMY     BREAST CYST EXCISION Bilateral pt unsure   BREAST EXCISIONAL BIOPSY Right    x1   BREAST EXCISIONAL BIOPSY Left    x2   BREAST SURGERY  1990,1991   lumpectomy, 3 x R breats, 2 x Left breast   SEPTOPLASTY  TEMPOROMANDIBULAR JOINT SURGERY  1998    FAMILY HISTORY: Family History  Problem Relation Age of Onset   Lung cancer Mother 54       smoked   Heart attack Mother    Coronary artery disease Mother    Heart attack Father 35   Coronary artery disease Father    Heart disease Sister    Hypertension Sister    Breast cancer Maternal Aunt    Cancer Maternal Aunt        breast   Stroke Maternal Grandmother 49   Heart attack Maternal Grandfather 68   Coronary artery disease Maternal Grandfather    Cancer Paternal Grandmother    Hypertension Brother     SOCIAL HISTORY:  Social History   Socioeconomic History   Marital status: Single    Spouse name: Not on file   Number of children: 2   Years of education: assoc degree   Highest education level: Associate degree: academic program  Occupational History    Comment: [employer]  Tobacco Use   Smoking status: Never    Passive exposure: Past   Smokeless tobacco: Never  Vaping Use   Vaping status: Never Used  Substance and Sexual Activity   Alcohol use: No   Drug use: Never   Sexual activity: Not on file  Other Topics Concern   Not on file  Social History Narrative   Caffeine- none. Lives home alone.  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  Caffein 2-3 cups daily (sometimes)   Social Determinants of Health   Financial Resource Strain: Not on file  Food Insecurity: Low Risk   (04/19/2023)   Received from [facility]   Hunger Vital Sign    Worried About Running Out of Food in the Last Year: Never true    Ran Out of Food in the Last Year: Never true  Transportation Needs: Not on file (04/19/2023)  Physical Activity: Not on file  Stress: Not on file  Social Connections: Not on file  Intimate Partner Violence: Not on file     PHYSICAL EXAM  GENERAL EXAM/CONSTITUTIONAL: Vitals:  Vitals:   07/17/23 0807  BP: (!) 145/64  Pulse: 60  Weight: 150 lb (68 kg)  Height: 5\' 4"  (1.626 m)   Body mass index is 25.75 kg/m. No results found. Patient is in no distress; well developed, nourished and groomed; neck is supple  CARDIOVASCULAR: Examination of carotid arteries is normal; no carotid bruits Regular rate and rhythm, no murmurs Examination of peripheral vascular system by observation and palpation is normal  EYES: Ophthalmoscopic exam of optic discs and posterior segments is normal; no papilledema or hemorrhages  MUSCULOSKELETAL: Gait, strength, tone, movements noted in Neurologic exam below  NEUROLOGIC: MENTAL STATUS:      No data to display         awake, alert, oriented to person, place and time recent and remote memory intact normal attention and concentration language fluent, comprehension intact, naming intact,  fund of knowledge appropriate  CRANIAL NERVE: 2nd - no papilledema on fundoscopic exam 2nd, 3rd, 4th, 6th - pupils equal and reactive to light, visual fields full to confrontation, extraocular muscles intact, no nystagmus 5th - facial sensation symmetric 7th - facial strength symmetric 8th - hearing intact 9th - palate elevates symmetrically, uvula midline 11th - shoulder shrug symmetric 12th - tongue protrusion midline  MOTOR:  normal bulk and tone, full strength in the BUE, BLE  SENSORY:  normal and symmetric to light touch, temperature, vibration  COORDINATION:  finger-nose-finger, fine finger movements  normal  REFLEXES:  deep tendon reflexes 1+ and symmetric  GAIT/STATION:  narrow based gait    DIAGNOSTIC DATA (LABS, IMAGING, TESTING) - I reviewed patient records, labs, notes, testing and imaging myself where available.  Lab Results  Component Value Date   WBC 7.9 11/12/2021   HGB 13.3 11/12/2021   HCT 41.2 11/12/2021   MCV 87.3 11/12/2021   PLT 226 11/12/2021      Component Value Date/Time   NA 144 07/04/2022 1459   K 3.7 07/04/2022 1459   CL 108 (H) 07/04/2022 1459   CO2 23 07/04/2022 1459   GLUCOSE 107 (H) 07/04/2022 1459   GLUCOSE 127 (H) 11/12/2021 1541   BUN 14 07/04/2022 1459   CREATININE 0.82 07/04/2022 1459   CALCIUM 9.7 07/04/2022 1459   PROT 6.4 06/19/2023 1004   ALBUMIN 4.3 06/19/2023 1004   AST 21 06/19/2023 1004   ALT 24 06/19/2023 1004   ALKPHOS 57 06/19/2023 1004   BILITOT 0.6 06/19/2023 1004   GFRNONAA >60 11/12/2021 1541   GFRAA 83 03/05/2019 1127   Lab Results  Component Value Date   CHOL 165 06/19/2023   HDL 45 06/19/2023   LDLCALC 103 (H) 06/19/2023   TRIG 90 06/19/2023   CHOLHDL 3.7 06/19/2023   Lab Results  Component Value Date   HGBA1C 5.7 (H) 10/12/2018   Lab Results  Component Value Date   VITAMINB12 918 10/12/2018   Lab Results  Component Value Date   TSH 1.36 03/18/2019   11/15/18 EMG/NCS This is a normal study. No electrodiagnostic evidence of large fiber neuropathy this time.   10/26/18 MRI brain - This is a normal with and without contrast.   10/26/18 MRI of the cervical spine with and without contrast shows the following: 1.    At C5-C6, there is mild spinal stenosis and moderate right foraminal narrowing.  There does not appear to be any nerve root compression.   2.    At C6-C7, there is borderline spinal stenosis but no foraminal narrowing or nerve root compression.   3.    There is subtle T2 hyperintensity in the spinal cord adjacent to C6.  The axial images are less clear.  This could be artifact or represent mild  myelopathic signal, possibly related to the adjacent spinal stenosis.  01/02/23 MRI brain - Normal brain MRI.     ASSESSMENT AND PLAN  72 y.o. year old female here with constellation of symptoms, primarily sensory disturbance in her left toes, nose and back of head and neck.  Migraine medication tried: metoprolol, tylenol, ibuprofen  Dx:  1. Migraine with aura and without status migrainosus, not intractable     PLAN:   MIGRAINE WITH AURA (2-12 HA per month)  MIGRAINE PREVENTION  LIFESTYLE CHANGES -Stop or avoid smoking -Decrease or avoid caffeine / alcohol -Eat and sleep on a regular schedule -Exercise several times per week - start amitriptyline 25mg  at bedtime - consider topiramate 50mg  at bedtime; after 1-2 weeks increase to 50mg  twice a day; drink plenty of water  Consider 2nd line - rimegepant (Nurtec) 75mg  every other day - atogepant Bennie Pierini) 60mg  daily - erenumab (Aimovig) 70mg  monthly (may increase to 140mg  monthly) - fremanezumab (Ajovy) 225mg  monthly (or 675mg  every 3 months) - galazanezumab (Emgality) 240mg  loading dose; then 120mg  monthly  MIGRAINE RESCUE  - headache diary  - ibuprofen, tylenol as needed - avoid TRIPTANS (h/o CAD) -  rimegepant (Nurtec) 75mg  as needed for breakthrough headache; max 8 per month  INSOMNIA - sleep hygiene reviewed - consider sleep psychology evaluation for anxiety  Meds ordered this encounter  Medications   amitriptyline (ELAVIL) 25 MG tablet    Sig: Take 1 tablet (25 mg total) by mouth at bedtime.    Dispense:  30 tablet    Refill:  3   Rimegepant Sulfate (NURTEC) 75 MG TBDP    Sig: Take 1 tablet (75 mg total) by mouth daily as needed.    Dispense:  8 tablet    Refill:  6   Return in about 4 months (around 11/17/2023) for MyChart visit (15 min).    Suanne Marker, MD 07/17/2023, 9:01 AM Certified in Neurology, Neurophysiology and Neuroimaging  [facility] [address] [place], [place] [address] [phone]

## 2023-07-18 NOTE — Procedures (Signed)
Lumbosacral Transforaminal Epidural Steroid Injection - Sub-Pedicular Approach with Fluoroscopic Guidance  Patient: Tracey Henson      Date of Birth: November 14, 1950 MRN: [id number] PCP: Deatra James, MD      Visit Date: 07/05/2023   Universal Protocol:    Date/Time: 07/05/2023  Consent Given By: the patient  Position: PRONE  Additional Comments: Vital signs were monitored before and after the procedure. Patient was prepped and draped in the usual sterile fashion. The correct patient, procedure, and site was verified.   Injection Procedure Details:   Procedure diagnoses: Lumbar radiculopathy [M54.16]    Meds Administered:  Meds ordered this encounter  Medications   methylPREDNISolone acetate (DEPO-MEDROL) injection 40 mg    Laterality: Right  Location/Site: L5  Needle:5.0 in., 22 ga.  Short bevel or Quincke spinal needle  Needle Placement: Transforaminal  Findings:    -Comments: Excellent flow of contrast along the nerve, nerve root and into the epidural space.  Procedure Details: After squaring off the end-plates to get a true AP view, the C-arm was positioned so that an oblique view of the foramen as noted above was visualized. The target area is just inferior to the "nose of the scotty dog" or sub pedicular. The soft tissues overlying this structure were infiltrated with 2-3 ml. of 1% Lidocaine without Epinephrine.  The spinal needle was inserted toward the target using a "trajectory" view along the fluoroscope beam.  Under AP and lateral visualization, the needle was advanced so it did not puncture dura and was located close the 6 O'Clock position of the pedical in AP tracterory. Biplanar projections were used to confirm position. Aspiration was confirmed to be negative for CSF and/or blood. A 1-2 ml. volume of Isovue-250 was injected and flow of contrast was noted at each level. Radiographs were obtained for documentation purposes.   After attaining the desired flow of  contrast documented above, a 0.5 to 1.0 ml test dose of 0.25% Marcaine was injected into each respective transforaminal space.  The patient was observed for 90 seconds post injection.  After no sensory deficits were reported, and normal lower extremity motor function was noted,   the above injectate was administered so that equal amounts of the injectate were placed at each foramen (level) into the transforaminal epidural space.   Additional Comments:  No complications occurred Dressing: 2 x 2 sterile gauze and Band-Aid    Post-procedure details: Patient was observed during the procedure. Post-procedure instructions were reviewed.  Patient left the clinic in stable condition.

## 2023-07-18 NOTE — Progress Notes (Signed)
Tracey Henson - 72 y.o. female MRN [id number]  Date of birth: 1951/08/08  Office Visit Note: Visit Date: 07/05/2023 PCP: Deatra James, MD Referred by: Deatra James, MD  Subjective: No chief complaint on file.  HPI:  Tracey Henson is a 72 y.o. female who comes in today at the request of Ellin Goodie, FNP for planned Right L5-S1 Lumbar Transforaminal epidural steroid injection with fluoroscopic guidance.  The patient has failed conservative care including home exercise, medications, time and activity modification.  This injection will be diagnostic and hopefully therapeutic.  Please see requesting physician notes for further details and justification.   ROS Otherwise per HPI.  Assessment & Plan: Visit Diagnoses:    ICD-10-CM   1. Lumbar radiculopathy  M54.16 XR C-ARM NO REPORT    Epidural Steroid injection    methylPREDNISolone acetate (DEPO-MEDROL) injection 40 mg      Plan: No additional findings.   Meds & Orders:  Meds ordered this encounter  Medications   methylPREDNISolone acetate (DEPO-MEDROL) injection 40 mg    Orders Placed This Encounter  Procedures   XR C-ARM NO REPORT   Epidural Steroid injection    Follow-up: Return for visit to requesting provider as needed.   Procedures: No procedures performed  Lumbosacral Transforaminal Epidural Steroid Injection - Sub-Pedicular Approach with Fluoroscopic Guidance  Patient: Tracey Henson      Date of Birth: 17-Sep-1951 MRN: [id number] PCP: Deatra James, MD      Visit Date: 07/05/2023   Universal Protocol:    Date/Time: 07/05/2023  Consent Given By: the patient  Position: PRONE  Additional Comments: Vital signs were monitored before and after the procedure. Patient was prepped and draped in the usual sterile fashion. The correct patient, procedure, and site was verified.   Injection Procedure Details:   Procedure diagnoses: Lumbar radiculopathy [M54.16]    Meds Administered:  Meds ordered this encounter   Medications   methylPREDNISolone acetate (DEPO-MEDROL) injection 40 mg    Laterality: Right  Location/Site: L5  Needle:5.0 in., 22 ga.  Short bevel or Quincke spinal needle  Needle Placement: Transforaminal  Findings:    -Comments: Excellent flow of contrast along the nerve, nerve root and into the epidural space.  Procedure Details: After squaring off the end-plates to get a true AP view, the C-arm was positioned so that an oblique view of the foramen as noted above was visualized. The target area is just inferior to the "nose of the scotty dog" or sub pedicular. The soft tissues overlying this structure were infiltrated with 2-3 ml. of 1% Lidocaine without Epinephrine.  The spinal needle was inserted toward the target using a "trajectory" view along the fluoroscope beam.  Under AP and lateral visualization, the needle was advanced so it did not puncture dura and was located close the 6 O'Clock position of the pedical in AP tracterory. Biplanar projections were used to confirm position. Aspiration was confirmed to be negative for CSF and/or blood. A 1-2 ml. volume of Isovue-250 was injected and flow of contrast was noted at each level. Radiographs were obtained for documentation purposes.   After attaining the desired flow of contrast documented above, a 0.5 to 1.0 ml test dose of 0.25% Marcaine was injected into each respective transforaminal space.  The patient was observed for 90 seconds post injection.  After no sensory deficits were reported, and normal lower extremity motor function was noted,   the above injectate was administered so that equal amounts of the injectate were placed  at each foramen (level) into the transforaminal epidural space.   Additional Comments:  No complications occurred Dressing: 2 x 2 sterile gauze and Band-Aid    Post-procedure details: Patient was observed during the procedure. Post-procedure instructions were reviewed.  Patient left the clinic in  stable condition.    Clinical History: Narrative & Impression CLINICAL DATA:  Low back pain radiating into the right hip for 3-4 months   EXAM: MRI LUMBAR SPINE WITHOUT CONTRAST   TECHNIQUE: Multiplanar, multisequence MR imaging of the lumbar spine was performed. No intravenous contrast was administered.   COMPARISON:  None Available.   FINDINGS: Segmentation:  Standard.   Alignment: No static listhesis. Dextrocurvature of the lumbar spine.   Vertebrae: No acute fracture, evidence of discitis, or aggressive bone lesion.   Conus medullaris and cauda equina: Conus extends to the L1-2 level. Conus and cauda equina appear normal.   Paraspinal and other soft tissues: No acute paraspinal abnormality.   Disc levels:   Disc spaces: Degenerative disease with disc height loss at L1-2, L2-3, L3-4. Disc desiccation at L4-5 and L5-S1.   T12-L1: No significant disc bulge. No neural foraminal stenosis. No central canal stenosis.   L1-L2: Mild broad-based disc bulge. Mild bilateral facet arthropathy. No foraminal or central canal stenosis.   L2-L3: Mild broad-based disc bulge. Mild bilateral facet arthropathy. No foraminal or central canal stenosis.   L3-L4: Broad-based disc bulge. Moderate bilateral facet arthropathy. Right lateral recess stenosis. No foraminal stenosis. Mild spinal stenosis.   L4-L5: Broad-based disc bulge. Moderate bilateral facet arthropathy. Bilateral lateral recess stenosis. Mild spinal stenosis. No foraminal stenosis.   L5-S1: Mild broad-based disc bulge. Severe bilateral facet arthropathy. Right lateral recess stenosis. Mild spinal stenosis. No foraminal stenosis.   IMPRESSION: 1. Lumbar spine spondylosis as described above. 2. No acute osseous injury of the lumbar spine.     Electronically Signed   By: Elige Ko M.D.   On: 06/26/2023 09:16     Objective:  VS:  HT:    WT:   BMI:     BP:   HR: bpm  TEMP: ( )  RESP:  Physical  Exam Vitals and nursing note reviewed.  Constitutional:      General: She is not in acute distress.    Appearance: Normal appearance. She is not ill-appearing.  HENT:     Head: Normocephalic and atraumatic.     Right Ear: External ear normal.     Left Ear: External ear normal.  Eyes:     Extraocular Movements: Extraocular movements intact.  Cardiovascular:     Rate and Rhythm: Normal rate.     Pulses: Normal pulses.  Pulmonary:     Effort: Pulmonary effort is normal. No respiratory distress.  Abdominal:     General: There is no distension.     Palpations: Abdomen is soft.  Musculoskeletal:        General: Tenderness present.     Cervical back: Neck supple.     Right lower leg: No edema.     Left lower leg: No edema.     Comments: Patient has good distal strength with no pain over the greater trochanters.  No clonus or focal weakness.  Skin:    Findings: No erythema, lesion or rash.  Neurological:     General: No focal deficit present.     Mental Status: She is alert and oriented to person, place, and time.     Sensory: No sensory deficit.     Motor:  No weakness or abnormal muscle tone.     Coordination: Coordination normal.  Psychiatric:        Mood and Affect: Mood normal.        Behavior: Behavior normal.      Imaging: No results found.

## 2023-07-19 ENCOUNTER — Ambulatory Visit (HOSPITAL_COMMUNITY): Payer: Medicare HMO | Attending: Cardiology

## 2023-07-19 DIAGNOSIS — I340 Nonrheumatic mitral (valve) insufficiency: Secondary | ICD-10-CM | POA: Insufficient documentation

## 2023-07-19 LAB — ECHOCARDIOGRAM COMPLETE
Area-P 1/2: 3.5 cm2
P 1/2 time: 606 ms
S' Lateral: 3 cm

## 2023-07-25 ENCOUNTER — Encounter: Payer: Self-pay | Admitting: Physician Assistant

## 2023-07-25 ENCOUNTER — Telehealth: Payer: Self-pay

## 2023-07-25 ENCOUNTER — Ambulatory Visit: Payer: Medicare HMO | Attending: Physician Assistant | Admitting: Physician Assistant

## 2023-07-25 ENCOUNTER — Other Ambulatory Visit (HOSPITAL_COMMUNITY): Payer: Self-pay

## 2023-07-25 VITALS — BP 128/66 | HR 58 | Ht 64.0 in | Wt 152.0 lb

## 2023-07-25 DIAGNOSIS — Z8249 Family history of ischemic heart disease and other diseases of the circulatory system: Secondary | ICD-10-CM | POA: Diagnosis not present

## 2023-07-25 DIAGNOSIS — I2510 Atherosclerotic heart disease of native coronary artery without angina pectoris: Secondary | ICD-10-CM

## 2023-07-25 DIAGNOSIS — I340 Nonrheumatic mitral (valve) insufficiency: Secondary | ICD-10-CM

## 2023-07-25 DIAGNOSIS — R5383 Other fatigue: Secondary | ICD-10-CM

## 2023-07-25 DIAGNOSIS — E785 Hyperlipidemia, unspecified: Secondary | ICD-10-CM

## 2023-07-25 DIAGNOSIS — I4719 Other supraventricular tachycardia: Secondary | ICD-10-CM

## 2023-07-25 DIAGNOSIS — R002 Palpitations: Secondary | ICD-10-CM

## 2023-07-25 MED ORDER — METOPROLOL SUCCINATE ER 25 MG PO TB24: 12.5000 mg | ORAL_TABLET | Freq: Every day | ORAL | 3 refills | Status: DC

## 2023-07-25 NOTE — Patient Instructions (Signed)
Medication Instructions:  Your physician has recommended you make the following change in your medication:  START TOPROL 12.5 MG DAILY AT BEDTIME   *If you need a refill on your cardiac medications before your next appointment, please call your pharmacy*   Lab Work: TODAY: CMET, CBC, TSH  If you have labs (blood work) drawn today and your tests are completely normal, you will receive your results only by: MyChart Message (if you have MyChart) OR A paper copy in the mail If you have any lab test that is abnormal or we need to change your treatment, we will call you to review the results.   Testing/Procedures: EKG   Follow-Up: At [facility], you and your health needs are our priority.  As part of our continuing mission to provide you with exceptional heart care, we have created designated Provider Care Teams.  These Care Teams include your primary Cardiologist (physician) and Advanced Practice Providers (APPs -  Physician Assistants and Nurse Practitioners) who all work together to provide you with the care you need, when you need it.  We recommend signing up for the patient portal called "MyChart".  Sign up information is provided on this After Visit Summary.  MyChart is used to connect with patients for Virtual Visits (Telemedicine).  Patients are able to view lab/test results, encounter notes, upcoming appointments, etc.  Non-urgent messages can be sent to your provider as well.   To learn more about what you can do with MyChart, go to [email or web address].    Your next appointment:   1 year(s)  Provider:   Donato Schultz, MD

## 2023-07-25 NOTE — Telephone Encounter (Signed)
Pharmacy Patient Advocate Encounter   Received notification from RX Request Messages that prior authorization for Nurtec 75MG  dispersible tablets is required/requested.   Insurance verification completed.   The patient is insured through [employer] .   Per test claim: PA required; PA submitted to above mentioned insurance via CoverMyMeds Key/confirmation #/EOC [id number] Status is pending

## 2023-07-26 ENCOUNTER — Other Ambulatory Visit (HOSPITAL_COMMUNITY): Payer: Self-pay

## 2023-07-26 LAB — COMPREHENSIVE METABOLIC PANEL
ALT: 21 [IU]/L (ref 0–32)
AST: 20 [IU]/L (ref 0–40)
Albumin: 4.5 g/dL (ref 3.8–4.8)
Alkaline Phosphatase: 73 [IU]/L (ref 44–121)
BUN/Creatinine Ratio: 16 (ref 12–28)
BUN: 14 mg/dL (ref 8–27)
Bilirubin Total: 0.5 mg/dL (ref 0.0–1.2)
CO2: 28 mmol/L (ref 20–29)
Calcium: 9.8 mg/dL (ref 8.7–10.3)
Chloride: 107 mmol/L — ABNORMAL HIGH (ref 96–106)
Creatinine, Ser: 0.88 mg/dL (ref 0.57–1.00)
Globulin, Total: 2.2 g/dL (ref 1.5–4.5)
Glucose: 97 mg/dL (ref 70–99)
Potassium: 5.2 mmol/L (ref 3.5–5.2)
Sodium: 146 mmol/L — ABNORMAL HIGH (ref 134–144)
Total Protein: 6.7 g/dL (ref 6.0–8.5)
eGFR: 69 mL/min/{1.73_m2} (ref >59)

## 2023-07-26 LAB — TSH: TSH: 2.55 u[IU]/mL (ref 0.450–4.500)

## 2023-07-26 LAB — CBC
Hematocrit: 41.2 % (ref 34.0–46.6)
Hemoglobin: 12.7 g/dL (ref 11.1–15.9)
MCH: 27.5 pg (ref 26.6–33.0)
MCHC: 30.8 g/dL — ABNORMAL LOW (ref 31.5–35.7)
MCV: 89 fL (ref 79–97)
Platelets: 214 10*3/uL (ref 150–450)
RBC: 4.61 x10E6/uL (ref 3.77–5.28)
RDW: 13.1 % (ref 11.7–15.4)
WBC: 7.5 10*3/uL (ref 3.4–10.8)

## 2023-07-26 NOTE — Telephone Encounter (Signed)
Pharmacy Patient Advocate Encounter  Received notification from [facility] that Prior Authorization for Nurtec 75MG  dispersible tablets has been APPROVED from 09/19/2022 to 09/18/2024. Ran test claim, Copay is $107.95. This test claim was processed through [facility]- copay amounts may vary at other pharmacies due to pharmacy/plan contracts, or as the patient moves through the different stages of their insurance plan.   PA #/Case ID/Reference #: PA Case ID #: [id number]

## 2023-07-28 LAB — CBC

## 2023-07-28 LAB — TSH

## 2023-07-28 LAB — COMPREHENSIVE METABOLIC PANEL

## 2023-08-13 ENCOUNTER — Other Ambulatory Visit: Payer: Self-pay | Admitting: Diagnostic Neuroimaging

## 2023-08-14 ENCOUNTER — Other Ambulatory Visit (HOSPITAL_COMMUNITY): Payer: Self-pay

## 2023-08-14 ENCOUNTER — Telehealth: Payer: Self-pay

## 2023-08-14 NOTE — Telephone Encounter (Signed)
Received a request to do a PA on Amitriptyline-However per test claim no PA is needed-Got a successful test claim on a 30 DS.

## 2023-08-14 NOTE — Telephone Encounter (Signed)
Noted  

## 2023-08-15 ENCOUNTER — Other Ambulatory Visit (HOSPITAL_COMMUNITY): Payer: Self-pay

## 2023-08-15 NOTE — Telephone Encounter (Signed)
I tried to call [facility] to verify-could not get an answer-called twice-wanted me to leave a voicemail-did not leave one because I will be out of office the next 3 days so would not be here to receive call back-I did run another test claim and got another successful paid claim at zero cost to the PT. Sorry I could not touch base with [facility]-I would advise PT to call before going to [facility] is possible but do not see why PT would have a problem.

## 2023-09-14 ENCOUNTER — Telehealth: Payer: Self-pay | Admitting: Cardiology

## 2023-09-16 ENCOUNTER — Encounter: Payer: Self-pay | Admitting: Cardiology

## 2023-09-18 ENCOUNTER — Telehealth: Payer: Self-pay

## 2023-09-18 ENCOUNTER — Other Ambulatory Visit (HOSPITAL_COMMUNITY): Payer: Self-pay

## 2023-09-18 NOTE — Telephone Encounter (Signed)
Pt enrolled in grant and notified of approval. Kennedy Bucker info and billing instructions have been faxed to [facility].

## 2023-09-18 NOTE — Telephone Encounter (Signed)
Patient Advocate Encounter   The patient was approved for a [employer] grant that will help cover the cost of REPATHA Total amount awarded, $2500.  Effective: 08/31/23 - 08/29/24   AOZ:[id number] QIO:[id number] [id number]   Pharmacy provided with approval and processing information. Patient informed via Dorcas Carrow, CPhT  Pharmacy Patient Advocate Specialist  Direct Number: [phone] Fax: [phone]

## 2023-09-21 ENCOUNTER — Telehealth: Payer: Self-pay | Admitting: Cardiology

## 2023-09-21 ENCOUNTER — Emergency Department (HOSPITAL_COMMUNITY)
Admission: EM | Admit: 2023-09-21 | Discharge: 2023-09-21 | Disposition: A | Discharge status: Home / Self Care | Payer: Medicare HMO | Attending: Emergency Medicine | Admitting: Emergency Medicine

## 2023-09-21 ENCOUNTER — Emergency Department (HOSPITAL_COMMUNITY): Payer: Medicare HMO

## 2023-09-21 DIAGNOSIS — J45909 Unspecified asthma, uncomplicated: Secondary | ICD-10-CM | POA: Diagnosis not present

## 2023-09-21 DIAGNOSIS — I4891 Unspecified atrial fibrillation: Secondary | ICD-10-CM

## 2023-09-21 DIAGNOSIS — R079 Chest pain, unspecified: Secondary | ICD-10-CM | POA: Diagnosis present

## 2023-09-21 DIAGNOSIS — I2510 Atherosclerotic heart disease of native coronary artery without angina pectoris: Secondary | ICD-10-CM | POA: Insufficient documentation

## 2023-09-21 LAB — CBC WITH DIFFERENTIAL/PLATELET
Abs Immature Granulocytes: 0.03 10*3/uL (ref 0.00–0.07)
Basophils Absolute: 0 10*3/uL (ref 0.0–0.1)
Basophils Relative: 0 %
Eosinophils Absolute: 0.1 10*3/uL (ref 0.0–0.5)
Eosinophils Relative: 1 %
HCT: 41.9 % (ref 36.0–46.0)
Hemoglobin: 13.6 g/dL (ref 12.0–15.0)
Immature Granulocytes: 0 %
Lymphocytes Relative: 21 %
Lymphs Abs: 2.1 10*3/uL (ref 0.7–4.0)
MCH: 28 pg (ref 26.0–34.0)
MCHC: 32.5 g/dL (ref 30.0–36.0)
MCV: 86.4 fL (ref 80.0–100.0)
Monocytes Absolute: 0.7 10*3/uL (ref 0.1–1.0)
Monocytes Relative: 7 %
Neutro Abs: 7 10*3/uL (ref 1.7–7.7)
Neutrophils Relative %: 71 %
Platelets: 267 10*3/uL (ref 150–400)
RBC: 4.85 MIL/uL (ref 3.87–5.11)
RDW: 13.1 % (ref 11.5–15.5)
WBC: 9.9 10*3/uL (ref 4.0–10.5)
nRBC: 0 % (ref 0.0–0.2)

## 2023-09-21 LAB — COMPREHENSIVE METABOLIC PANEL
ALT: 25 U/L (ref 0–44)
AST: 34 U/L (ref 15–41)
Albumin: 3.8 g/dL (ref 3.5–5.0)
Alkaline Phosphatase: 51 U/L (ref 38–126)
Anion gap: 11 (ref 5–15)
BUN: 14 mg/dL (ref 8–23)
CO2: 23 mmol/L (ref 22–32)
Calcium: 9.4 mg/dL (ref 8.9–10.3)
Chloride: 106 mmol/L (ref 98–111)
Creatinine, Ser: 0.84 mg/dL (ref 0.44–1.00)
GFR, Estimated: >60 mL/min (ref >60)
Glucose, Bld: 88 mg/dL (ref 70–99)
Potassium: 4.6 mmol/L (ref 3.5–5.1)
Sodium: 140 mmol/L (ref 135–145)
Total Bilirubin: 1.3 mg/dL — ABNORMAL HIGH (ref 0.0–1.2)
Total Protein: 6.5 g/dL (ref 6.5–8.1)

## 2023-09-21 LAB — MAGNESIUM: Magnesium: 2 mg/dL (ref 1.7–2.4)

## 2023-09-21 LAB — TROPONIN I (HIGH SENSITIVITY): Troponin I (High Sensitivity): 4 ng/L (ref <18)

## 2023-09-21 MED ORDER — LACTATED RINGERS IV BOLUS
1000.0000 mL | Freq: Once | INTRAVENOUS | Status: AC
Start: 2023-09-21 — End: 2023-09-21
  Administered 2023-09-21: 1000 mL via INTRAVENOUS

## 2023-09-21 NOTE — ED Notes (Signed)
 Pt reporting feeling dizzy/lightheaded. Denies chest pain and/or shortness of breath. Cardiac monitor showing sinus brady. MD made aware of situation.

## 2023-09-21 NOTE — ED Notes (Signed)
 Pt ambulated without assistance in the hallway. Tolerated well. Pt denies shortness of breath/dizziness/chest pain.

## 2023-09-21 NOTE — Discharge Instructions (Addendum)
 We evaluated you for your fast heart rate, shortness of breath and chest pain.  Your testing was reassuring including your cardiac enzymes.  Your first EKG did show atrial fibrillation with a fast heart rate.  This converted to a normal rhythm on its own.  Please follow-up with your cardiologist.  We have placed a cardiology referral to help facilitate expedited follow-up.  Atrial fibrillation does put you at higher risk of stroke.  We discussed starting anticoagulation to help prevent this, but you prefer to follow-up with your cardiologist.  Please return immediately to the emergency department if you develop any stroke symptoms such as facial droop, weakness in your arms or legs, vision changes, speech changes, trouble walking, or other concerning symptoms.  Please also return if you have recurrent chest pain, difficulty breathing, racing heartbeat, or any other new or concerning symptoms.

## 2023-09-21 NOTE — ED Provider Notes (Signed)
 [place] [facility] Provider Note  CSN: [id number] Arrival date & time: 09/21/23 1646  Chief Complaint(s) Chest Pain  HPI Tracey Henson is a 73 y.o. female history of , coronary artery disease, hyperlipidemia presenting to the emergency department chest pain.  Patient reports this morning she developed chest pain around 4 AM, associated with palpitations, lightheadedness and dizziness, shortness of breath.  No nausea or vomiting.  No fevers or chills.  No syncope.  She reports palpitations during the episode.  Has had previous similar episodes, however normally they last less than an hour.  She was given a cardiac monitor, was diagnosed with possible SVT.  She has been taking metoprolol  at nighttime and has not had any further of those episodes until today.   Past Medical History Past Medical History:  Diagnosis Date   Asthma    seasonal   Baker cyst    left leg   Baker's cyst, left    left leg   Breast cyst    Frequent headaches    GERD (gastroesophageal reflux disease)    Hearing loss    bilateral   Hypercholesteremia    Muscle cramps    Vertigo    Patient Active Problem List   Diagnosis Date Noted   CAD (coronary artery disease) 06/10/2021   Atypical chest pain 06/10/2021   Aortic atherosclerosis (HCC) 06/10/2021   Leg cramping 06/10/2021   Palpitations 06/10/2021   Multiple pulmonary nodules 06/10/2015   Upper airway cough syndrome 06/08/2015   DOE (dyspnea on exertion) 06/08/2015   Hyperlipidemia 04/09/2015   Family history of ischemic heart disease 12/09/2013   Home Medication(s) Prior to Admission medications   Medication Sig Start Date End Date Taking? Authorizing Provider  amitriptyline  (ELAVIL ) 25 MG tablet TAKE 1 TABLET BY MOUTH EVERYDAY AT BEDTIME 08/16/23   Penumalli, Vikram R, MD  Cholecalciferol (VITAMIN D3) 1.25 MG (50000 UT) TABS Take 25 mcg by mouth daily.    [provider]  cyanocobalamin  100 MCG tablet  Take 100 mcg by mouth daily. Patient not taking: Reported on 07/25/2023 11/28/19   [provider]  Evolocumab  (REPATHA  SURECLICK) 140 MG/ML SOAJ INJECT 140MG  INTO THE SKIN EVERY 14 DAYS 09/14/23   Jeffrie Oneil BROCKS, MD  Magnesium Gluconate 250 MG TABS Take 12.5 mg by mouth daily. Patient not taking: Reported on 07/25/2023    [provider]  methocarbamol (ROBAXIN) 500 MG tablet Take 500 mg by mouth 4 (four) times daily. Patient not taking: Reported on 07/25/2023 03/06/23   [provider]  metoprolol  succinate (TOPROL  XL) 25 MG 24 hr tablet Take 0.5 tablets (12.5 mg total) by mouth daily. 07/25/23   Parthenia Olivia HERO, PA-C  montelukast  (SINGULAIR ) 10 MG tablet TAKE 1 TABLET BY MOUTH EVERYDAY AT BEDTIME 02/10/23   Darlean Ozell NOVAK, MD  omeprazole  (PRILOSEC) 40 MG capsule TAKE 1 CAPSULE EVERY DAY 11/11/22   Darlean Ozell NOVAK, MD  pravastatin  (PRAVACHOL ) 40 MG tablet Take 1 tablet (40 mg total) by mouth every evening. 11/11/22   Jeffrie Oneil BROCKS, MD  Rimegepant Sulfate (NURTEC) 75 MG TBDP Take 1 tablet (75 mg total) by mouth daily as needed. 07/17/23   Penumalli, Eduard SAUNDERS, MD  UNABLE TO FIND Med Name: elderberry/zinc twice daily    [provider]  valACYclovir (VALTREX) 500 MG tablet Take 500 mg by mouth every other day.    [provider]  Zinc Gluconate 50 MG CAPS Take 50 mg by mouth daily. Patient  not taking: Reported on 07/25/2023 11/28/19   [provider]                                                                                                                                    Past Surgical History Past Surgical History:  Procedure Laterality Date   ABDOMINAL HYSTERECTOMY  1987   APPENDECTOMY  1963   BILATERAL OOPHORECTOMY     BREAST CYST EXCISION Bilateral pt unsure   BREAST EXCISIONAL BIOPSY Right    x1   BREAST EXCISIONAL BIOPSY Left    x2   BREAST SURGERY  1990,1991   lumpectomy, 3 x R breats, 2 x Left breast   SEPTOPLASTY      TEMPOROMANDIBULAR JOINT SURGERY  1998   Family History Family History  Problem Relation Age of Onset   Lung cancer Mother 62       smoked   Heart attack Mother    Coronary artery disease Mother    Heart attack Father 20   Coronary artery disease Father    Heart disease Sister    Hypertension Sister    Breast cancer Maternal Aunt    Cancer Maternal Aunt        breast   Stroke Maternal Grandmother 59   Heart attack Maternal Grandfather 68   Coronary artery disease Maternal Grandfather    Cancer Paternal Grandmother    Hypertension Brother     Social History Social History   Tobacco Use   Smoking status: Never    Passive exposure: Past   Smokeless tobacco: Never  Vaping Use   Vaping status: Never Used  Substance Use Topics   Alcohol use: No   Drug use: Never   Allergies Vancomycin , Atorvastatin , Cefaclor, Codeine, Crestor  [rosuvastatin ], Gabapentin , Nexlizet  [bempedoic acid-ezetimibe], and Oxybutynin  Review of Systems Review of Systems  All other systems reviewed and are negative.   Physical Exam Vital Signs  I have reviewed the triage vital signs BP (!) 101/47   Pulse (!) 59   Temp 97.6 F (36.4 C) (Oral)   Resp 15   LMP  (LMP Unknown)   SpO2 98%  Physical Exam Vitals and nursing note reviewed.  Constitutional:      General: She is not in acute distress.    Appearance: She is well-developed.  HENT:     Head: Normocephalic and atraumatic.     Mouth/Throat:     Mouth: Mucous membranes are moist.  Eyes:     Pupils: Pupils are equal, round, and reactive to light.  Cardiovascular:     Rate and Rhythm: Normal rate and regular rhythm.     Heart sounds: No murmur heard. Pulmonary:     Effort: Pulmonary effort is normal. No respiratory distress.     Breath sounds: Normal breath sounds.  Abdominal:     General: Abdomen is flat.     Palpations: Abdomen is soft.  Tenderness: There is no abdominal tenderness.  Musculoskeletal:        General: No  tenderness.     Right lower leg: No edema.     Left lower leg: No edema.  Skin:    General: Skin is warm and dry.  Neurological:     General: No focal deficit present.     Mental Status: She is alert. Mental status is at baseline.  Psychiatric:        Mood and Affect: Mood normal.        Behavior: Behavior normal.     ED Results and Treatments Labs (all labs ordered are listed, but only abnormal results are displayed) Labs Reviewed  COMPREHENSIVE METABOLIC PANEL - Abnormal; Notable for the following components:      Result Value   Total Bilirubin 1.3 (*)    All other components within normal limits  CBC WITH DIFFERENTIAL/PLATELET  MAGNESIUM  TROPONIN I (HIGH SENSITIVITY)                                                                                                                          Radiology DG Chest Port 1 View Result Date: 09/21/2023 CLINICAL DATA:  Chest pain and dizziness. EXAM: PORTABLE CHEST 1 VIEW COMPARISON:  Radiographs 03/07/2022. Included portions from cardiac CT 07/27/2022 FINDINGS: Heart size upper normal. Stable mediastinal contours. Aortic atherosclerosis. Eventration of right hemidiaphragm, chronic. Mild biapical pleuroparenchymal scarring. No acute airspace disease, pulmonary edema, large pleural effusion or pneumothorax. IMPRESSION: 1. No acute chest findings. 2. Chronic eventration of right hemidiaphragm. Electronically Signed   By: Andrea Gasman M.D.   On: 09/21/2023 21:12    Pertinent labs & imaging results that were available during my care of the patient were reviewed by me and considered in my medical decision making (see MDM for details).  Medications Ordered in ED Medications  lactated ringers  bolus 1,000 mL (1,000 mLs Intravenous New Bag/Given 09/21/23 2056)                                                                                                                                     Procedures Procedures  (including critical care  time)  Medical Decision Making / ED Course   MDM:  73 year old female presenting to the emergency department with chest pain, palpitations.  Vitals notable for tachycardia on arrival to 135.  EKG shows heart rate of 143.  Appears to be in atrial fibrillation.  Reviewed prior Holter monitor reports which showed SVT but no A-fib.  Suspect this is the cause of her symptoms.  She does have history of underlying coronary artery disease and had chest pain during the episode.  Will check troponin.  If elevated may need delta troponin.  Patient not having any ongoing symptoms which is reassuring.  Check basic labs to evaluate for electrolyte disturbance.  Low concern for other causes of chest pain such as pneumothorax, pneumonia, dissection but will check chest x-ray.  If workup is reassuring anticipate discharge with outpatient cardiology referral  Discussed anticoagulation, CHA2DS2-VASc score is 2 at least, would recommend anticoagulation, she understands the risk of stroke with atrial fibrillation, which she would prefer to follow-up with her cardiologist to discuss this further.  Clinical Course as of 09/21/23 2232  Thu Sep 21, 2023  2231 Patient workup reassuring including laboratory testing, electrolytes, troponin.  Chest x-ray clear.  Patient had 1 brief episode of bradycardia and low blood pressure and felt lightheaded, received fluids with improvement, was able to ambulate without difficulty or feeling lightheaded, she reports she still feels at baseline and wants to go home.  Advise close follow-up with her cardiologist and strict return precautions for any strokelike symptoms given patient not wanting to start anticoagulation today, or any recurrent palpitations chest pain or other symptoms of atrial fibrillation. Will discharge patient to home. All questions answered. Patient comfortable with plan of discharge. Return precautions discussed with patient and specified on the after visit summary.   [WS]    Clinical Course User Index [WS] Francesca Elsie CROME, MD     Additional history obtained: -Additional history obtained from friend -External records from outside source obtained and reviewed including: Chart review including previous notes, labs, imaging, consultation notes including prior notes    Lab Tests: -I ordered, reviewed, and interpreted labs.   The pertinent results include:   Labs Reviewed  COMPREHENSIVE METABOLIC PANEL - Abnormal; Notable for the following components:      Result Value   Total Bilirubin 1.3 (*)    All other components within normal limits  CBC WITH DIFFERENTIAL/PLATELET  MAGNESIUM  TROPONIN I (HIGH SENSITIVITY)    Notable for normal troponin  EKG   EKG Interpretation Date/Time:  Thursday September 21 2023 19:21:54 EST Ventricular Rate:  67 PR Interval:  124 QRS Duration:  79 QT Interval:  414 QTC Calculation: 437 R Axis:   60  Text Interpretation: Sinus rhythm Abnormal R-wave progression, early transition Confirmed by Francesca Elsie ([id number]) on 09/21/2023 8:39:03 PM         Imaging Studies ordered: I ordered imaging studies including CXR On my interpretation imaging demonstrates no acute process I independently visualized and interpreted imaging. I agree with the radiologist interpretation   Medicines ordered and prescription drug management: Meds ordered this encounter  Medications   lactated ringers  bolus 1,000 mL    -I have reviewed the patients home medicines and have made adjustments as needed    Cardiac Monitoring: The patient was maintained on a cardiac monitor.  I personally viewed and interpreted the cardiac monitored which showed an underlying rhythm of: NSR  Reevaluation: After the interventions noted above, I reevaluated the patient and found that their symptoms have resolved  Co morbidities that complicate the patient evaluation  Past Medical History:  Diagnosis Date   Asthma    seasonal   Baker cyst     left leg   Baker's cyst, left  left leg   Breast cyst    Frequent headaches    GERD (gastroesophageal reflux disease)    Hearing loss    bilateral   Hypercholesteremia    Muscle cramps    Vertigo       Dispostion: Disposition decision including need for hospitalization was considered, and patient discharged from emergency department.    Final Clinical Impression(s) / ED Diagnoses Final diagnoses:  Atrial fibrillation with RVR (HCC)     This chart was dictated using voice recognition software.  Despite best efforts to proofread,  errors can occur which can change the documentation meaning.    Francesca Elsie CROME, MD 09/21/23 2232

## 2023-09-21 NOTE — ED Triage Notes (Signed)
 Pt states that at 0400 she began having L sided CP, dizziness, and SOB.

## 2023-09-21 NOTE — Telephone Encounter (Signed)
 Called pt in regards to palpitations and elevated HR 138. Pt reports feels SOB can feel heart doing all sort of things.   Pt reports has not had many episodes since starting metoprolol .  Typically has symptoms at night that last about an hour.  This episode has gone on since 4 am.  Pt reports has no energy and does not feel good.  Advised pt of need for ED visit for evaluation.  Pt is agreeable to recommendation will have someone drive her to ED for evaluation.

## 2023-09-21 NOTE — Telephone Encounter (Signed)
 Pt c/o medication issue:  1. Name of Medication:   metoprolol  succinate (TOPROL  XL) 25 MG 24 hr tablet    2. How are you currently taking this medication (dosage and times per day)?   Take 0.5 tablets (12.5 mg total) by mouth daily.    3. Are you having a reaction (difficulty breathing--STAT)? No  4. What is your medication issue? Pt states she has been experiencing palpitations in her chest and neck. Please advise

## 2023-09-21 NOTE — Telephone Encounter (Signed)
 Please see my chart encounter from 09/21/23 for more details.

## 2023-09-25 ENCOUNTER — Encounter: Payer: Self-pay | Admitting: Cardiology

## 2023-09-25 ENCOUNTER — Ambulatory Visit: Payer: Medicare HMO | Attending: Cardiology | Admitting: Cardiology

## 2023-09-25 VITALS — BP 150/66 | HR 64 | Ht 64.0 in | Wt 150.6 lb

## 2023-09-25 DIAGNOSIS — I2510 Atherosclerotic heart disease of native coronary artery without angina pectoris: Secondary | ICD-10-CM | POA: Diagnosis not present

## 2023-09-25 DIAGNOSIS — I480 Paroxysmal atrial fibrillation: Secondary | ICD-10-CM | POA: Diagnosis not present

## 2023-09-25 DIAGNOSIS — E785 Hyperlipidemia, unspecified: Secondary | ICD-10-CM | POA: Diagnosis not present

## 2023-09-25 DIAGNOSIS — R002 Palpitations: Secondary | ICD-10-CM | POA: Diagnosis not present

## 2023-09-25 MED ORDER — APIXABAN 5 MG PO TABS: 5.0000 mg | ORAL_TABLET | Freq: Two times a day (BID) | ORAL | 0 refills | Status: DC

## 2023-09-25 MED ORDER — APIXABAN 5 MG PO TABS: 5.0000 mg | ORAL_TABLET | Freq: Two times a day (BID) | ORAL | 6 refills | Status: DC

## 2023-09-25 MED ORDER — DILTIAZEM HCL 30 MG PO TABS: 30.0000 mg | ORAL_TABLET | Freq: Four times a day (QID) | ORAL | 3 refills | Status: DC | PRN

## 2023-09-25 NOTE — Patient Instructions (Signed)
 Medication Instructions:  Please start Eliquis  5 mg one tablet twice a day. You may take Diltiazem  30 mg once every 6 hours as needed. Continue all other medications as listed.  *If you need a refill on your cardiac medications before your next appointment, please call your pharmacy*  You have been referred to Electrophysiology to discuss At Fibrillation ablation.  Follow-Up: At [facility], you and your health needs are our priority.  As part of our continuing mission to provide you with exceptional heart care, we have created designated Provider Care Teams.  These Care Teams include your primary Cardiologist (physician) and Advanced Practice Providers (APPs -  Physician Assistants and Nurse Practitioners) who all work together to provide you with the care you need, when you need it.  We recommend signing up for the patient portal called MyChart.  Sign up information is provided on this After Visit Summary.  MyChart is used to connect with patients for Virtual Visits (Telemedicine).  Patients are able to view lab/test results, encounter notes, upcoming appointments, etc.  Non-urgent messages can be sent to your provider as well.   To learn more about what you can do with MyChart, go to [email or web address].    Your next appointment:   6 month(s)  Provider:   You will follow up in the Atrial Fibrillation Clinic located at [facility]. Your provider will be: Clint R. Fenton, PA-C or Fairy Heinrich, PA-C    Atrial Fibrillation Atrial fibrillation (AFib) is a type of heartbeat that is irregular or fast. If you have AFib, your heart beats without any order. This makes it hard for your heart to pump blood in a normal way. AFib may come and go, or it may become a long-lasting problem. If AFib is not treated, it can put you at higher risk for stroke, heart failure, and other heart problems. What are the causes? AFib may be caused by diseases that damage the heart's  electrical system. They include: High blood pressure. Heart failure. Heart valve diseases. Heart surgery. Diabetes. Thyroid  disease. Kidney disease. Lung diseases, such as pneumonia or COPD. Sleep apnea. Sometimes the cause is not known. What increases the risk? You are more likely to develop AFib if: You are older. You exercise often and very hard. You have a family history of AFib. You are female. You are Caucasian. You are overweight. You smoke. You drink a lot of alcohol. What are the signs or symptoms? Common symptoms of this condition include: A feeling that your heart is beating very fast. Chest pain or discomfort. Feeling short of breath. Suddenly feeling light-headed or weak. Getting tired easily during activity. Fainting. Sweating. In some cases, there are no symptoms. How is this treated? Medicines to: Prevent blood clots. Treat heart rate or heart rhythm problems. Using devices, such as a pacemaker, to correct heart rhythm problems. Doing surgery to remove the part of the heart that sends bad signals. Closing an area where clots can form in the heart (left atrial appendage). In some cases, your doctor will treat other underlying conditions. Follow these instructions at home: Medicines Take over-the-counter and prescription medicines only as told by your doctor. Do not take any new medicines without first talking to your doctor. If you are taking blood thinners: Talk with your doctor before taking aspirin or NSAIDs, such as ibuprofen . Take your medicines as told. Take them at the same time each day. Do not do things that could hurt or bruise you. Be careful to avoid falls.  Wear an alert bracelet or carry a card that says you take blood thinners. Lifestyle Do not smoke or use any products that contain nicotine or tobacco. If you need help quitting, ask your doctor. Eat heart-healthy foods. Talk with your doctor about the right eating plan for you. Exercise  regularly as told by your doctor. Do not drink alcohol. Lose weight if you are overweight. General instructions If you have sleep apnea, treat it as told by your doctor. Do not use diet pills unless your doctor says they are safe for you. Diet pills may make heart problems worse. Keep all follow-up visits. Your doctor will check your heart rate and rhythm regularly. Contact a doctor if: You notice a change in the speed, rhythm, or strength of your heartbeat. You are taking a blood-thinning medicine and you get more bruising. You get tired more easily when you move or exercise. You have a sudden change in weight. Get help right away if:  You have pain in your chest. You have trouble breathing. You have side effects of blood thinners, such as blood in your vomit, poop (stool), or pee (urine), or bleeding that cannot stop. You have any signs of a stroke. BE FAST is an easy way to remember the main warning signs: B - Balance. Dizziness, sudden trouble walking, or loss of balance. E - Eyes. Trouble seeing or a change in how you see. F - Face. Sudden weakness or loss of feeling in the face. The face or eyelid may droop on one side. A - Arms.Weakness or loss of feeling in an arm. This happens suddenly and usually on one side of the body. S - Speech. Sudden trouble speaking, slurred speech, or trouble understanding what people say. T - Time.Time to call emergency services. Write down what time symptoms started. You have other signs of a stroke, such as: A sudden, very bad headache with no known cause. Feeling like you may vomit (nausea). Vomiting. A seizure. These symptoms may be an emergency. Get help right away. Call 911. Do not wait to see if the symptoms will go away. Do not drive yourself to the hospital. This information is not intended to replace advice given to you by your health care provider. Make sure you discuss any questions you have with your health care provider. Document  Revised: 05/25/2022 Document Reviewed: 05/25/2022 Elsevier Patient Education  2024 [employer].

## 2023-09-25 NOTE — Progress Notes (Signed)
 Cardiology Office Note:  .   Date:  09/25/2023  ID:  Tracey Henson, DOB 1950/11/25, MRN [id number] PCP: Lynwood Laneta LELON DEVONNA   HeartCare Providers Cardiologist:  Oneil Parchment, MD     History of Present Illness: .   Tracey Henson is a 73 y.o. female Discussed with the use of AI scribe   History of Present Illness   The patient, a 73 year old individual with a recent diagnosis of paroxysmal atrial fibrillation (AFib), presents for follow-up. The AFib was discovered during an emergency department visit on 09/21/23, where an EKG showed a heart rate of 143 beats per minute, rapid ventricular response, and nonspecific STT wave changes. The patient reported experiencing chest discomfort, palpitations, lightheadedness, and shortness of breath during this episode. Prior to this, the patient had been monitored for supraventricular tachycardia (SVT) or paroxysmal atrial tachycardia (PAT), but no AFib was detected.  The patient has a history of mild to moderate nonobstructive coronary disease, as evidenced by a coronary CT scan performed on 07/27/22. The scan showed mild proximal LID calcified stenosis and minimal proximal RCA stenosis. The coronary calcium  score was 246, placing the patient in the 81st percentile. An echocardiogram performed on 07/19/23 showed a normal ejection fraction and mild mitral regurgitation, which was previously reported as moderate.  The patient has been taking Repatha  and pravastatin  for hyperlipidemia. The most recent LDL level, checked on 06/19/23, was 103 with a goal of less than 70. The patient's creatinine was 0.8, and hemoglobin was 13.3. The patient also takes metoprolol  at night.  The patient reports ongoing indigestion and back pain, which may or may not be related to the AFib. The patient also reports episodes of rapid heart rate at night, typically lasting about an hour. These episodes have been occurring since the AFib diagnosis on 09/21/23. The patient also reports  pain between the shoulder blades when sneezing, which is suspected to be musculoskeletal in nature.          ROS: No bleeding  Studies Reviewed: .        Results   LABS Troponin: Normal (09/21/2023) LDL: 103 (06/19/2023) Creatinine: 0.8 (06/19/2023) Hemoglobin: 13.3 (06/19/2023)  RADIOLOGY Coronary CT scan: Mild to moderate nonobstructive coronary disease, mild proximal LAD calcified stenosis, minimal proximal RCA stenosis, coronary calcium  score 246 (81st percentile) (07/27/2022) Chest x-ray: No acute findings (09/21/2023)  DIAGNOSTIC EKG: Atrial fibrillation with rapid ventricular response, heart rate 143 bpm, nonspecific ST-T wave changes (09/21/2023) Echocardiogram: Normal ejection fraction, mild mitral regurgitation (07/19/2023)     Risk Assessment/Calculations:    CHA2DS2-VASc Score = 3   This indicates a 3.2% annual risk of stroke. The patient's score is based upon: CHF History: 0 HTN History: 0 Diabetes History: 0 Stroke History: 0 Vascular Disease History: 1 Age Score: 1 Gender Score: 1           Physical Exam:   VS:  BP (!) 150/66   Pulse 64   Ht 5' 4 (1.626 m)   Wt 150 lb 9.6 oz (68.3 kg)   LMP  (LMP Unknown)   SpO2 97%   BMI 25.85 kg/m    Wt Readings from Last 3 Encounters:  09/25/23 150 lb 9.6 oz (68.3 kg)  07/25/23 152 lb (68.9 kg)  07/17/23 150 lb (68 kg)    GEN: Well nourished, well developed in no acute distress NECK: No JVD; No carotid bruits CARDIAC: RRR, no murmurs, no rubs, no gallops RESPIRATORY:  Clear to auscultation without rales, wheezing or  rhonchi  ABDOMEN: Soft, non-tender, non-distended EXTREMITIES:  No edema; No deformity   ASSESSMENT AND PLAN: .       Paroxysmal Atrial Fibrillation with Rapid Ventricular Response (RVR) Diagnosed on 09/21/2023 in the emergency department with a heart rate of 143 bpm, RVR, and nonspecific ST-T wave changes. Symptoms included chest discomfort, palpitations, lightheadedness, and  shortness of breath. CHADS-VASc score is 3, indicating a high risk for stroke. Discussed anticoagulation to reduce stroke risk. Eliquis  was recommended due to its safety profile and effectiveness, with risks similar to low-dose aspirin. Alternative options include ablation and Watchman device placement, requiring electrophysiology consultation. Ablation is more successful early in the disease course. Watchman device can eliminate the need for anticoagulation but requires a procedure. - Prescribe Eliquis  5mg  BID - Refer to electrophysiology for ablation and potential Watchman device placement - Advise on lifestyle modifications: avoid caffeine and alcohol swings, stay active - Provide Cardizem  30mg  Q6 hrs PRN for acute episodes - Continue metoprolol  ER 12.5 QD. Had prior bradycardia.   Hyperlipidemia Managed with Repatha  and pravastatin . Last LDL check on 06/19/2023 was 103 mg/dL with a goal of less than 70 mg/dL. - Continue Repatha  and pravastatin  - Monitor LDL levels  Musculoskeletal Pain Pain between shoulder blades, exacerbated by sneezing, likely musculoskeletal in origin. Chest x-ray showed no acute findings. - Monitor and report worsening symptoms to primary care physician - Consider over-the-counter pain relief options if needed  General Health Maintenance None - Advise on sleep hygiene: elevate head if experiencing indigestion, consider over-the-counter options like Pepcid  or Prilosec for indigestion - Encourage regular physical activity and walking  Follow-up - Follow up with electrophysiology for ablation and Watchman device consultation - Monitor symptoms and use Cardizem  for acute episodes - No need to visit ER for atrial fibrillation unless symptoms worsen significantly.             Signed, Oneil Parchment, MD

## 2023-09-26 ENCOUNTER — Encounter: Payer: Self-pay | Admitting: Cardiology

## 2023-09-26 LAB — LIPID PANEL
Chol/HDL Ratio: 2 {ratio} (ref 0.0–4.4)
Cholesterol, Total: 88 mg/dL — ABNORMAL LOW (ref 100–199)
HDL: 43 mg/dL (ref >39)
LDL Chol Calc (NIH): 24 mg/dL (ref 0–99)
Triglycerides: 114 mg/dL (ref 0–149)
VLDL Cholesterol Cal: 21 mg/dL (ref 5–40)

## 2023-09-27 ENCOUNTER — Encounter: Payer: Self-pay | Admitting: Cardiology

## 2023-09-28 ENCOUNTER — Telehealth: Payer: Self-pay | Admitting: *Deleted

## 2023-09-28 ENCOUNTER — Encounter: Payer: Self-pay | Admitting: Cardiology

## 2023-09-28 NOTE — Telephone Encounter (Signed)
 Reviewed this information with Dr Anne Fu who advised pt to f/u with PCP for further evaluation.  Pt states understanding and was appreciative of the call and assistance.

## 2023-09-28 NOTE — Telephone Encounter (Signed)
 I could not remember or figure out how to use a can opener I have used for 30 years.  Little scary for sure.    [name] and spoke with pt who is reporting yesterday she had a headache in the back of her head.  She took Tylenol  and laid down.  Later she got up and was going to eat soup but she could not figure out how to use her can opener that she has had for years. She put the soup back and went to lay down again.  She does no know if she had any one sided weakness or slurred speech at the time. Since then she feels OK the only thing she has noticed was feeling like her balance was a little off this morning when she got up.  This resolved on its own within a short period of time (just a few seconds).   BP today 151/70 HR 52, 142/67 HR 57, yesterday 151/70 HR 55.  No current s/s.  Advised I will have Dr Jeffrie review and c/b with further instructions orders.

## 2023-10-09 NOTE — Progress Notes (Unsigned)
Electrophysiology Office Note:   Date:  10/10/2023  ID:  Tracey Henson, DOB 02/24/1951, MRN [id number]  Primary Cardiologist: Donato Schultz, MD Primary Heart Failure: None Electrophysiologist: [name], MD      History of Present Illness:   Tracey Henson is a 73 y.o. female with h/o paroxysmal atrial fibrillation, mild to moderate nonobstructive coronary disease, HLD who is being seen today for evaluation of atrial fibrillation.  Patient presented to ED on 09/21/23 with a heart rate of 143bpm. New diagnosis of atrial fibrillation. She reports that she had palpitations accompanied by significant fatigue. Still having palpitations most days, usually at night.  Has not been able to resume usual activities due to increased fatigue. She has been taking metoprolol and Eliquis without issue. Otherwise no new or acute complaints.   Review of systems complete and found to be negative unless listed in HPI.   EP Information / Studies Reviewed:    EKG is not ordered today. EKG from 09/21/23 reviewed which showed sinus rhythm.  EKG Interpretation Date/Time:  Tuesday October 10 2023 09:51:23 EST Ventricular Rate:  58 PR Interval:  140 QRS Duration:  74 QT Interval:  446 QTC Calculation: 437 R Axis:   51  Text Interpretation: Sinus bradycardia When compared with ECG of 21-Sep-2023 19:21,no significant change. Confirmed by [name] [phone]) on 10/10/2023 10:31:23 AM  EKG 09/21/23: AF    Coronary CTA 07/27/22: IMPRESSION: 1. Coronary calcium score of 246. This was 48 percentile for age and sex matched control. 2. Normal coronary origin with right dominance. 3. Mild proximal LAD calcified stenosis 25-49%, minimal 0-24% RCA stenosis. 4.  Aortic atherosclerosis. 5.  Recommend goal directed medical therapy.  Echo 07/19/23:  1. Left ventricular ejection fraction, by estimation, is 60 to 65%. The  left ventricle has normal function. The left ventricle has no regional  wall motion  abnormalities. There is mild left ventricular hypertrophy.  Left ventricular diastolic parameters  were normal. The average left ventricular global longitudinal strain is  -17.2 %. The global longitudinal strain is normal.   2. Right ventricular systolic function is normal. The right ventricular  size is normal. Tricuspid regurgitation signal is inadequate for assessing  PA pressure.   3. The mitral valve is normal in structure. Mild mitral valve  regurgitation. No evidence of mitral stenosis.   4. The aortic valve is tricuspid. Aortic valve regurgitation is trivial.  No aortic stenosis is present.   5. The inferior vena cava is normal in size with greater than 50%  respiratory variability, suggesting right atrial pressure of 3 mmHg.   Risk Assessment/Calculations:    CHA2DS2-VASc Score = 3   This indicates a 3.2% annual risk of stroke. The patient's score is based upon: CHF History: 0 HTN History: 0 Diabetes History: 0 Stroke History: 0 Vascular Disease History: 1 Age Score: 1 Gender Score: 1         Physical Exam:   VS:  BP (!) 143/64 (BP Location: Left Arm, Patient Position: Sitting, Cuff Size: Normal)   Pulse (!) 58   Ht 5\' 4"  (1.626 m)   Wt 156 lb 6.4 oz (70.9 kg)   LMP  (LMP Unknown)   SpO2 99%   BMI 26.85 kg/m    Wt Readings from Last 3 Encounters:  10/10/23 156 lb 6.4 oz (70.9 kg)  09/25/23 150 lb 9.6 oz (68.3 kg)  07/25/23 152 lb (68.9 kg)     GEN: Well nourished, well developed in no acute distress NECK:  No JVD CARDIAC: Bradycardic, regular rhythm.  RESPIRATORY:  Clear to auscultation without rales, wheezing or rhonchi  ABDOMEN: Soft, non-tender, non-distended EXTREMITIES:  No edema; No deformity   ASSESSMENT AND PLAN:   Tracey Henson is a 73 y.o. female with h/o paroxysmal atrial fibrillation, mild to moderate nonobstructive coronary disease, HLD who is being seen today for evaluation of atrial fibrillation.  #. Paroxysmal atrial fibrillation,  symptomatic: -Discussed treatment options today for AF including antiarrhythmic drug therapy and ablation. Discussed risks, recovery and likelihood of success with each treatment strategy. Risk, benefits, and alternatives to EP study and ablation for afib were discussed. These risks include but are not limited to stroke, bleeding, vascular damage, tamponade, perforation, damage to the esophagus, lungs, phrenic nerve and other structures, pulmonary vein stenosis, worsening renal function, coronary vasospasm and death.  Discussed potential need for repeat ablation procedures and antiarrhythmic drugs after an initial ablation. The patient understands these risk and wishes to proceed.  We will therefore proceed with catheter ablation at the next available time.  Carto, ICE, anesthesia are requested for the procedure.  Will also obtain CT PV protocol prior to the procedure to exclude LAA thrombus and further evaluate atrial anatomy. -Start dronedarone as bridge to ablation.  -Zio monitor to assess for burden.  -Brain MRI ordered by primary care is pending. We will have her visit with me after this is done and prior to ablation date to ensure there is no contraindication to proceeding.  #. Secondary hypercoagulable state due to atrial fibrillation:  -CHADSVASC score of 3. -Continue Eliquis. She is tolerating Eliquis and currently not interested in Watchman procedure at this time. She would like to pursue ablation first.  Follow up with Dr. Jimmey Ralph in 4 weeks  Signed, [name], MD

## 2023-10-10 ENCOUNTER — Other Ambulatory Visit: Payer: Self-pay

## 2023-10-10 ENCOUNTER — Ambulatory Visit: Payer: Medicare HMO | Attending: Cardiology | Admitting: Cardiology

## 2023-10-10 ENCOUNTER — Other Ambulatory Visit: Payer: Self-pay | Admitting: Family Medicine

## 2023-10-10 ENCOUNTER — Encounter: Payer: Self-pay | Admitting: Cardiology

## 2023-10-10 VITALS — BP 143/64 | HR 58 | Ht 64.0 in | Wt 156.4 lb

## 2023-10-10 DIAGNOSIS — I2510 Atherosclerotic heart disease of native coronary artery without angina pectoris: Secondary | ICD-10-CM

## 2023-10-10 DIAGNOSIS — R002 Palpitations: Secondary | ICD-10-CM | POA: Diagnosis not present

## 2023-10-10 DIAGNOSIS — I480 Paroxysmal atrial fibrillation: Secondary | ICD-10-CM

## 2023-10-10 DIAGNOSIS — R413 Other amnesia: Secondary | ICD-10-CM

## 2023-10-10 MED ORDER — DRONEDARONE HCL 400 MG PO TABS: 400.0000 mg | ORAL_TABLET | Freq: Two times a day (BID) | ORAL | 3 refills | Status: DC

## 2023-10-10 NOTE — Patient Instructions (Addendum)
Medication Instructions:  Your physician has recommended you make the following change in your medication:  1) START taking Multaq (dronederone) 400 mg twice daily   *If you need a refill on your cardiac medications before your next appointment, please call your pharmacy*  Lab Work: BMET and CBC prior to your ablation  Testing/Procedures: Event Monitor  Your physician has recommended that you wear an event monitor. Event monitors are medical devices that record the heart's electrical activity. Doctors most often [place] these monitors to diagnose arrhythmias. Arrhythmias are problems with the speed or rhythm of the heartbeat. The monitor is a small, portable device. You can wear one while you do your normal daily activities. This is usually used to diagnose what is causing palpitations/syncope (passing out).  Cardiac CT Your physician has requested that you have cardiac CT. Cardiac computed tomography (CT) is a painless test that uses an x-ray machine to take clear, detailed pictures of your heart.  We will call you to schedule your CT scan. It will be done about three weeks prior to your ablation.  Ablation Your physician has recommended that you have an ablation. Catheter ablation is a medical procedure used to treat some cardiac arrhythmias (irregular heartbeats). During catheter ablation, a long, thin, flexible tube is put into a blood vessel in your groin (upper thigh), or neck. This tube is called an ablation catheter. It is then guided to your heart through the blood vessel. Radio frequency waves destroy small areas of heart tissue where abnormal heartbeats may cause an arrhythmia to start. You are scheduled for Atrial Fibrillation Ablation on Monday, February 24 with Dr. Michele Rockers.Please arrive at the Main Entrance A at [facility]: [address] [place], [place] [address] at 5:30 AM    Follow-Up: At [facility], you and your health needs are our priority.  As part of  our continuing mission to provide you with exceptional heart care, we have created designated Provider Care Teams.  These Care Teams include your primary Cardiologist (physician) and Advanced Practice Providers (APPs -  Physician Assistants and Nurse Practitioners) who all work together to provide you with the care you need, when you need it.  Your next appointment:   February 10th at 9:15am  Provider:   [name], MD    Christena Deem- Long Term Monitor Instructions  Your physician has requested you wear a ZIO patch monitor for 14 days.  This is a single patch monitor. [employer] supplies one patch monitor per enrollment. Additional stickers are not available. Please do not apply patch if you will be having a Nuclear Stress Test,  Echocardiogram, Cardiac CT, MRI, or Chest Xray during the period you would be wearing the  monitor. The patch cannot be worn during these tests. You cannot remove and re-apply the  ZIO XT patch monitor.  Your ZIO patch monitor will be mailed 3 day USPS to your address on file. It may take 3-5 days  to receive your monitor after you have been enrolled.  Once you have received your monitor, please review the enclosed instructions. Your monitor  has already been registered assigning a specific monitor serial # to you.  Billing and Patient Assistance Program Information  We have supplied [employer] with any of your insurance information on file for billing purposes. [employer] offers a sliding scale Patient Assistance Program for patients that do not have  insurance, or whose insurance does not completely cover the cost of the ZIO monitor.  You must apply  for the Patient Assistance Program to qualify for this discounted rate.  To apply, please call [employer] at [phone], select option 4, select option 2, ask to apply for  Patient Assistance Program. Meredeth Ide will ask your household income, and how many people  are in your household. They will quote your out-of-pocket cost  based on that information.  [employer] will also be able to set up a 27-month, interest-free payment plan if needed.  Applying the monitor   Shave hair from upper left chest.  Hold abrader disc by orange tab. Rub abrader in 40 strokes over the upper left chest as  indicated in your monitor instructions.  Clean area with 4 enclosed alcohol pads. Let dry.  Apply patch as indicated in monitor instructions. Patch will be placed under collarbone on left  side of chest with arrow pointing upward.  Rub patch adhesive wings for 2 minutes. Remove white label marked "1". Remove the white  label marked "2". Rub patch adhesive wings for 2 additional minutes.  While looking in a mirror, press and release button in center of patch. A small green light will  flash 3-4 times. This will be your only indicator that the monitor has been turned on.  Do not shower for the first 24 hours. You may shower after the first 24 hours.  Press the button if you feel a symptom. You will hear a small click. Record Date, Time and  Symptom in the Patient Logbook.  When you are ready to remove the patch, follow instructions on the last 2 pages of Patient  Logbook. Stick patch monitor onto the last page of Patient Logbook.  Place Patient Logbook in the blue and white box. Use locking tab on box and tape box closed  securely. The blue and white box has prepaid postage on it. Please place it in the mailbox as  soon as possible. Your physician should have your test results approximately 7 days after the  monitor has been mailed back to [facility].  Call [facility] Customer Care at [phone] if you have questions regarding  your ZIO XT patch monitor. Call them immediately if you see an orange light blinking on your  monitor.  If your monitor falls off in less than 4 days, contact our Monitor department at [phone].  If your monitor becomes loose or falls off after 4 days call [employer] at [phone] for   suggestions on securing your monitor

## 2023-10-11 ENCOUNTER — Encounter: Payer: Self-pay | Admitting: Cardiology

## 2023-10-12 ENCOUNTER — Encounter: Payer: Self-pay | Admitting: Cardiology

## 2023-10-13 ENCOUNTER — Encounter: Payer: Self-pay | Admitting: Cardiology

## 2023-10-13 ENCOUNTER — Telehealth: Payer: Self-pay | Admitting: Cardiology

## 2023-10-13 NOTE — Telephone Encounter (Signed)
Pt c/o medication issue:   1. Name of Medication:    dronedarone (MULTAQ) 400 MG tablet    2. How are you currently taking this medication (dosage and times per day)?   Not started as yet   3. Are you having a reaction (difficulty breathing--STAT)?    4. What is your medication issue?    Patient stated this medication is too expensive for her and her coupon does not apply to Medicaid.  Patient wants to know if she can get in a grant to get this medication.

## 2023-10-13 NOTE — Telephone Encounter (Signed)
Spoke with patient and she can not use the co pay card because she has medicare. There is not a Engineer, manufacturing for [employer]. She would like to know what else you will recommend.

## 2023-10-16 MED ORDER — AMIODARONE HCL 200 MG PO TABS: ORAL_TABLET | ORAL | 3 refills | Status: DC

## 2023-10-16 NOTE — Telephone Encounter (Signed)
See MyChart message. Prescription has been sent in.

## 2023-10-23 ENCOUNTER — Ambulatory Visit: Payer: Medicare HMO | Attending: Cardiology

## 2023-10-23 ENCOUNTER — Institutional Professional Consult (permissible substitution): Payer: Medicare HMO | Admitting: Cardiology

## 2023-10-23 ENCOUNTER — Encounter: Payer: Self-pay | Admitting: Cardiology

## 2023-10-23 DIAGNOSIS — I480 Paroxysmal atrial fibrillation: Secondary | ICD-10-CM

## 2023-10-23 NOTE — Progress Notes (Unsigned)
z

## 2023-10-29 NOTE — Progress Notes (Signed)
 Electrophysiology Office Note:   Date:  10/30/2023  ID:  Tracey Henson, DOB May 20, 1951, MRN [id number]  Primary Cardiologist: Donato Schultz, MD Primary Heart Failure: None Electrophysiologist: [name], MD      History of Present Illness:   Tracey Henson is a 73 y.o. female with h/o paroxysmal atrial fibrillation, mild to moderate nonobstructive coronary disease, HLD who was seen for evaluation of atrial fibrillation. Patient presented to ED on 09/21/23 with a heart rate of 143bpm. New diagnosis of atrial fibrillation. She reports that she had palpitations accompanied by significant fatigue. Started on metoprolol and Eliquis without issue.  Patient reports doing relatively well since last clinic visit. Taking amiodarone with no known recurrences. Had brain MRI which was normal. No new or acute complaints.   Review of systems complete and found to be negative unless listed in HPI.   EP Information / Studies Reviewed:    EKG is not ordered today. EKG from 09/21/23 reviewed which showed sinus rhythm.     EKG 09/21/23: AF    Coronary CTA 07/27/22: IMPRESSION: 1. Coronary calcium score of 246. This was 36 percentile for age and sex matched control. 2. Normal coronary origin with right dominance. 3. Mild proximal LAD calcified stenosis 25-49%, minimal 0-24% RCA stenosis. 4.  Aortic atherosclerosis. 5.  Recommend goal directed medical therapy.  Echo 07/19/23:  1. Left ventricular ejection fraction, by estimation, is 60 to 65%. The  left ventricle has normal function. The left ventricle has no regional  wall motion abnormalities. There is mild left ventricular hypertrophy.  Left ventricular diastolic parameters  were normal. The average left ventricular global longitudinal strain is  -17.2 %. The global longitudinal strain is normal.   2. Right ventricular systolic function is normal. The right ventricular  size is normal. Tricuspid regurgitation signal is inadequate for assessing  PA  pressure.   3. The mitral valve is normal in structure. Mild mitral valve  regurgitation. No evidence of mitral stenosis.   4. The aortic valve is tricuspid. Aortic valve regurgitation is trivial.  No aortic stenosis is present.   5. The inferior vena cava is normal in size with greater than 50%  respiratory variability, suggesting right atrial pressure of 3 mmHg.   MRI Brain 10/15/23: No acute intracranial abnormality. Negative for subdural collection, hydrocephalus, or mass lesion. No lobar predominant brain parenchymal volume loss identified. If there is ongoing concern regarding a neurodegenerative process then follow-up FDG brain PET may be of benefit for further assessment.   Risk Assessment/Calculations:    CHA2DS2-VASc Score = 3   This indicates a 3.2% annual risk of stroke. The patient's score is based upon: CHF History: 0 HTN History: 0 Diabetes History: 0 Stroke History: 0 Vascular Disease History: 1 Age Score: 1 Gender Score: 1         Physical Exam:   VS:  BP 118/70 (BP Location: Left Arm, Patient Position: Sitting, Cuff Size: Normal)   Pulse (!) 56   Ht 5\' 4"  (1.626 m)   Wt 157 lb 6.4 oz (71.4 kg)   LMP  (LMP Unknown)   SpO2 97%   BMI 27.02 kg/m    Wt Readings from Last 3 Encounters:  10/30/23 157 lb 6.4 oz (71.4 kg)  10/10/23 156 lb 6.4 oz (70.9 kg)  09/25/23 150 lb 9.6 oz (68.3 kg)     GEN: Well nourished, well developed in no acute distress NECK: No JVD CARDIAC: Bradycardic, regular rhythm.  RESPIRATORY:  Clear to auscultation without  rales, wheezing or rhonchi  ABDOMEN: Soft, non-distended EXTREMITIES:  No edema; No deformity   ASSESSMENT AND PLAN:   Tracey Henson is a 73 y.o. female with h/o paroxysmal atrial fibrillation, mild to moderate nonobstructive coronary disease, HLD who is being seen today for evaluation of atrial fibrillation.  #. Paroxysmal atrial fibrillation, symptomatic: -Discussed treatment options again today for AF including  antiarrhythmic drug therapy and ablation. Discussed risks, recovery and likelihood of success with each treatment strategy. Risk, benefits, and alternatives to EP study and ablation for afib were discussed. These risks include but are not limited to stroke, bleeding, vascular damage, tamponade, perforation, damage to the esophagus, lungs, phrenic nerve and other structures, pulmonary vein stenosis, worsening renal function, coronary vasospasm and death.  Discussed potential need for repeat ablation procedures and antiarrhythmic drugs after an initial ablation. The patient understands these risk and wishes to proceed.  We will therefore proceed with catheter ablation at the next available time.  Carto, ICE, anesthesia are requested for the procedure.  Will also obtain CT PV protocol prior to the procedure to exclude LAA thrombus and further evaluate atrial anatomy. -Continue amiodarone as bridge to ablation.  -Zio monitor pending.  -Brain MRI ordered by primary care did not show any acute abnormality. Okay to proceed with ablation.   #. Secondary hypercoagulable state due to atrial fibrillation:  -CHADSVASC score of 3. -Continue Eliquis. She is tolerating Eliquis and currently not interested in Watchman procedure at this time. She would like to pursue ablation first.  Follow up with Dr. Jimmey Ralph after ablation.  Signed, [name], MD

## 2023-10-29 NOTE — H&P (View-Only) (Signed)
 Electrophysiology Office Note:   Date:  10/30/2023  ID:  Leticia Coletta, DOB May 20, 1951, MRN [id number]  Primary Cardiologist: Donato Schultz, MD Primary Heart Failure: None Electrophysiologist: [name], MD      History of Present Illness:   Jazarah Capili is a 73 y.o. female with h/o paroxysmal atrial fibrillation, mild to moderate nonobstructive coronary disease, HLD who was seen for evaluation of atrial fibrillation. Patient presented to ED on 09/21/23 with a heart rate of 143bpm. New diagnosis of atrial fibrillation. She reports that she had palpitations accompanied by significant fatigue. Started on metoprolol and Eliquis without issue.  Patient reports doing relatively well since last clinic visit. Taking amiodarone with no known recurrences. Had brain MRI which was normal. No new or acute complaints.   Review of systems complete and found to be negative unless listed in HPI.   EP Information / Studies Reviewed:    EKG is not ordered today. EKG from 09/21/23 reviewed which showed sinus rhythm.     EKG 09/21/23: AF    Coronary CTA 07/27/22: IMPRESSION: 1. Coronary calcium score of 246. This was 36 percentile for age and sex matched control. 2. Normal coronary origin with right dominance. 3. Mild proximal LAD calcified stenosis 25-49%, minimal 0-24% RCA stenosis. 4.  Aortic atherosclerosis. 5.  Recommend goal directed medical therapy.  Echo 07/19/23:  1. Left ventricular ejection fraction, by estimation, is 60 to 65%. The  left ventricle has normal function. The left ventricle has no regional  wall motion abnormalities. There is mild left ventricular hypertrophy.  Left ventricular diastolic parameters  were normal. The average left ventricular global longitudinal strain is  -17.2 %. The global longitudinal strain is normal.   2. Right ventricular systolic function is normal. The right ventricular  size is normal. Tricuspid regurgitation signal is inadequate for assessing  PA  pressure.   3. The mitral valve is normal in structure. Mild mitral valve  regurgitation. No evidence of mitral stenosis.   4. The aortic valve is tricuspid. Aortic valve regurgitation is trivial.  No aortic stenosis is present.   5. The inferior vena cava is normal in size with greater than 50%  respiratory variability, suggesting right atrial pressure of 3 mmHg.   MRI Brain 10/15/23: No acute intracranial abnormality. Negative for subdural collection, hydrocephalus, or mass lesion. No lobar predominant brain parenchymal volume loss identified. If there is ongoing concern regarding a neurodegenerative process then follow-up FDG brain PET may be of benefit for further assessment.   Risk Assessment/Calculations:    CHA2DS2-VASc Score = 3   This indicates a 3.2% annual risk of stroke. The patient's score is based upon: CHF History: 0 HTN History: 0 Diabetes History: 0 Stroke History: 0 Vascular Disease History: 1 Age Score: 1 Gender Score: 1         Physical Exam:   VS:  BP 118/70 (BP Location: Left Arm, Patient Position: Sitting, Cuff Size: Normal)   Pulse (!) 56   Ht 5\' 4"  (1.626 m)   Wt 157 lb 6.4 oz (71.4 kg)   LMP  (LMP Unknown)   SpO2 97%   BMI 27.02 kg/m    Wt Readings from Last 3 Encounters:  10/30/23 157 lb 6.4 oz (71.4 kg)  10/10/23 156 lb 6.4 oz (70.9 kg)  09/25/23 150 lb 9.6 oz (68.3 kg)     GEN: Well nourished, well developed in no acute distress NECK: No JVD CARDIAC: Bradycardic, regular rhythm.  RESPIRATORY:  Clear to auscultation without  rales, wheezing or rhonchi  ABDOMEN: Soft, non-distended EXTREMITIES:  No edema; No deformity   ASSESSMENT AND PLAN:   Roanne Haye is a 73 y.o. female with h/o paroxysmal atrial fibrillation, mild to moderate nonobstructive coronary disease, HLD who is being seen today for evaluation of atrial fibrillation.  #. Paroxysmal atrial fibrillation, symptomatic: -Discussed treatment options again today for AF including  antiarrhythmic drug therapy and ablation. Discussed risks, recovery and likelihood of success with each treatment strategy. Risk, benefits, and alternatives to EP study and ablation for afib were discussed. These risks include but are not limited to stroke, bleeding, vascular damage, tamponade, perforation, damage to the esophagus, lungs, phrenic nerve and other structures, pulmonary vein stenosis, worsening renal function, coronary vasospasm and death.  Discussed potential need for repeat ablation procedures and antiarrhythmic drugs after an initial ablation. The patient understands these risk and wishes to proceed.  We will therefore proceed with catheter ablation at the next available time.  Carto, ICE, anesthesia are requested for the procedure.  Will also obtain CT PV protocol prior to the procedure to exclude LAA thrombus and further evaluate atrial anatomy. -Continue amiodarone as bridge to ablation.  -Zio monitor pending.  -Brain MRI ordered by primary care did not show any acute abnormality. Okay to proceed with ablation.   #. Secondary hypercoagulable state due to atrial fibrillation:  -CHADSVASC score of 3. -Continue Eliquis. She is tolerating Eliquis and currently not interested in Watchman procedure at this time. She would like to pursue ablation first.  Follow up with Dr. Jimmey Ralph after ablation.  Signed, [name], MD

## 2023-10-30 ENCOUNTER — Encounter: Payer: Self-pay | Admitting: Cardiology

## 2023-10-30 ENCOUNTER — Ambulatory Visit: Payer: Medicare HMO | Attending: Cardiology | Admitting: Cardiology

## 2023-10-30 VITALS — BP 118/70 | HR 56 | Ht 64.0 in | Wt 157.4 lb

## 2023-10-30 DIAGNOSIS — D6869 Other thrombophilia: Secondary | ICD-10-CM

## 2023-10-30 DIAGNOSIS — I480 Paroxysmal atrial fibrillation: Secondary | ICD-10-CM

## 2023-10-30 LAB — CBC

## 2023-10-30 NOTE — Telephone Encounter (Signed)
 Patient seen in clinic today by Dr. Daneil Dunker

## 2023-10-30 NOTE — Patient Instructions (Signed)
 Medication Instructions:  Your physician recommends that you continue on your current medications as directed. Please refer to the Current Medication list given to you today.  *If you need a refill on your cardiac medications before your next appointment, please call your pharmacy*  Lab Work: TODAY: BMET and CBC If you have labs (blood work) drawn today and your tests are completely normal, you will receive your results only by: MyChart Message (if you have MyChart) OR A paper copy in the mail If you have any lab test that is abnormal or we need to change your treatment, we will call you to review the results.  Follow-Up: At [facility], you and your health needs are our priority.  As part of our continuing mission to provide you with exceptional heart care, we have created designated Provider Care Teams.  These Care Teams include your primary Cardiologist (physician) and Advanced Practice Providers (APPs -  Physician Assistants and Nurse Practitioners) who all work together to provide you with the care you need, when you need it.  Follow up as scheduled

## 2023-10-31 ENCOUNTER — Encounter: Payer: Self-pay | Admitting: Cardiology

## 2023-10-31 LAB — CBC
Hematocrit: 39.5 % (ref 34.0–46.6)
Hemoglobin: 12.8 g/dL (ref 11.1–15.9)
MCH: 28.9 pg (ref 26.6–33.0)
MCHC: 32.4 g/dL (ref 31.5–35.7)
MCV: 89 fL (ref 79–97)
Platelets: 248 10*3/uL (ref 150–450)
RBC: 4.43 x10E6/uL (ref 3.77–5.28)
RDW: 12.6 % (ref 11.7–15.4)
WBC: 7.2 10*3/uL (ref 3.4–10.8)

## 2023-10-31 LAB — BASIC METABOLIC PANEL
BUN/Creatinine Ratio: 14 (ref 12–28)
BUN: 15 mg/dL (ref 8–27)
CO2: 23 mmol/L (ref 20–29)
Calcium: 9.5 mg/dL (ref 8.7–10.3)
Chloride: 107 mmol/L — ABNORMAL HIGH (ref 96–106)
Creatinine, Ser: 1.11 mg/dL — ABNORMAL HIGH (ref 0.57–1.00)
Glucose: 108 mg/dL — ABNORMAL HIGH (ref 70–99)
Potassium: 4.5 mmol/L (ref 3.5–5.2)
Sodium: 143 mmol/L (ref 134–144)
eGFR: 52 mL/min/{1.73_m2} — ABNORMAL LOW (ref >59)

## 2023-11-02 ENCOUNTER — Encounter: Payer: Self-pay | Admitting: Cardiology

## 2023-11-02 ENCOUNTER — Ambulatory Visit (HOSPITAL_COMMUNITY)
Admission: RE | Admit: 2023-11-02 | Discharge: 2023-11-02 | Disposition: A | Discharge status: Home / Self Care | Payer: Medicare HMO | Source: Ambulatory Visit | Attending: Cardiology | Admitting: Cardiology

## 2023-11-02 ENCOUNTER — Other Ambulatory Visit: Payer: Self-pay | Admitting: Internal Medicine

## 2023-11-02 DIAGNOSIS — I480 Paroxysmal atrial fibrillation: Secondary | ICD-10-CM | POA: Insufficient documentation

## 2023-11-02 DIAGNOSIS — I2510 Atherosclerotic heart disease of native coronary artery without angina pectoris: Secondary | ICD-10-CM | POA: Diagnosis present

## 2023-11-02 DIAGNOSIS — R002 Palpitations: Secondary | ICD-10-CM | POA: Insufficient documentation

## 2023-11-02 MED ORDER — IOHEXOL 350 MG/ML SOLN
95.0000 mL | Freq: Once | INTRAVENOUS | Status: AC | PRN
  Administered 2023-11-02: 95 mL via INTRAVENOUS

## 2023-11-02 NOTE — Telephone Encounter (Signed)
Spoke with patient, apologized for the poor CT experience and explained the $2,000 max out of pocket for medicare D plans this year. Patient will reach out to her insurance company about being set up on a payment plan. Patient has already used a free 30 day card. If she has any issues getting her Eliquis she will contact our office back. I stressed the importance of not missing any doses prior to and post op. No further needs at this time

## 2023-11-03 ENCOUNTER — Telehealth: Payer: Self-pay | Admitting: Cardiology

## 2023-11-03 ENCOUNTER — Encounter: Payer: Self-pay | Admitting: Cardiology

## 2023-11-03 ENCOUNTER — Other Ambulatory Visit (HOSPITAL_COMMUNITY): Payer: Self-pay

## 2023-11-03 ENCOUNTER — Telehealth: Payer: Self-pay | Admitting: Pharmacy Technician

## 2023-11-03 NOTE — Telephone Encounter (Signed)
  Will resubmit in 7 days

## 2023-11-03 NOTE — Telephone Encounter (Signed)
Pt c/o medication issue:  1. Name of Medication: Eliquis  2. How are you currently taking this medication (dosage and times per day)?   3. Are you having a reaction (difficulty breathing--STAT)?   4. What is your medication issue?  Patient says Francine Graven said that you need to call and tell them, that this medicine is medically necessary

## 2023-11-03 NOTE — Telephone Encounter (Signed)
Pharmacy Patient Advocate Encounter   Received notification from Pt Calls Messages that prior authorization for eliquis is required/requested.   Insurance verification completed.   The patient is insured through [employer] .   Per test claim: PA required; PA submitted to above mentioned insurance via CoverMyMeds Key/confirmation #/EOC [id number] Status is pending

## 2023-11-06 ENCOUNTER — Other Ambulatory Visit (HOSPITAL_COMMUNITY): Payer: Self-pay

## 2023-11-07 ENCOUNTER — Encounter: Payer: Self-pay | Admitting: Cardiology

## 2023-11-07 ENCOUNTER — Telehealth: Payer: Self-pay | Admitting: Cardiology

## 2023-11-07 ENCOUNTER — Other Ambulatory Visit (HOSPITAL_COMMUNITY): Payer: Self-pay

## 2023-11-07 NOTE — Telephone Encounter (Signed)
Patient calling the office for samples of medication:   1.  What medication and dosage are you requesting samples for? Eliquis  2.  Are you currently out of this medication? Yes- waiting on her prior authorization

## 2023-11-08 NOTE — Telephone Encounter (Signed)
Issue resolved. See mychart message

## 2023-11-09 DIAGNOSIS — I480 Paroxysmal atrial fibrillation: Secondary | ICD-10-CM | POA: Diagnosis not present

## 2023-11-10 NOTE — Pre-Procedure Instructions (Signed)
 Attempted to call patient regarding procedure instructions.  Left voicemail on the following items: Arrival time 0515 Nothing to eat or drink after midnight No meds AM of procedure Responsible person to drive you home and stay with you for 24 hrs  Have you missed any doses of anti-coagulant Eliquis- should be taken twice a day, if you have missed any doses please let us know.  Don't take dose on Monday morning.

## 2023-11-12 NOTE — Anesthesia Preprocedure Evaluation (Signed)
 Anesthesia Evaluation  Patient identified by MRN, date of birth, ID band Patient awake    Reviewed: Allergy & Precautions, NPO status , Patient's Chart, lab work & pertinent test results  Airway Mallampati: III  TM Distance: >3 FB Neck ROM: Full    Dental no notable dental hx. (+) Dental Advisory Given, Teeth Intact   Pulmonary shortness of breath, with exertion and lying, asthma , neg recent URI   Pulmonary exam normal breath sounds clear to auscultation- rhonchi   (-) rales    Cardiovascular hypertension, Pt. on home beta blockers + CAD, + Orthopnea and + DOE  Normal cardiovascular exam+ Valvular Problems/Murmurs MR  Rhythm:Regular Rate:Normal  Echo 06/2023  1. Left ventricular ejection fraction, by estimation, is 60 to 65%. The left ventricle has normal function. The left ventricle has no regional wall motion abnormalities. There is mild left ventricular hypertrophy. Left ventricular diastolic parameters were normal. The average left ventricular global longitudinal strain is -17.2 %. The global longitudinal strain is normal.   2. Right ventricular systolic function is normal. The right ventricular size is normal. Tricuspid regurgitation signal is inadequate for assessing PA pressure.   3. The mitral valve is normal in structure. Mild mitral valve regurgitation. No evidence of mitral stenosis.   4. The aortic valve is tricuspid. Aortic valve regurgitation is trivial. No aortic stenosis is present.   5. The inferior vena cava is normal in size with greater than 50% respiratory variability, suggesting right atrial pressure of 3 mmHg.   Comparison(s): A prior study was performed on 07/18/2022. Moderate MR is now mild otherwise see report for details.      Neuro/Psych  Headaches    GI/Hepatic Neg liver ROS,GERD  Medicated and Controlled,,  Endo/Other  negative endocrine ROS    Renal/GU negative Renal ROS      Musculoskeletal negative musculoskeletal ROS (+)    Abdominal   Peds  Hematology negative hematology ROS (+)   Anesthesia Other Findings   Reproductive/Obstetrics                             Anesthesia Physical Anesthesia Plan  ASA: 3  Anesthesia Plan: General   Post-op Pain Management: Tylenol PO (pre-op)* and Minimal or no pain anticipated   Induction: Intravenous  PONV Risk Score and Plan: 3 and Ondansetron, Dexamethasone and Treatment may vary due to age or medical condition  Airway Management Planned: Oral ETT  Additional Equipment:   Intra-op Plan:   Post-operative Plan: Extubation in OR  Informed Consent: I have reviewed the patients History and Physical, chart, labs and discussed the procedure including the risks, benefits and alternatives for the proposed anesthesia with the patient or authorized representative who has indicated his/her understanding and acceptance.     Dental advisory given  Plan Discussed with: CRNA  Anesthesia Plan Comments: (Per patient this morning in interview, she presented with SpO2 79%. I was not notified by staff. I hooked her up to the monitor and her SpO2 was 89% at first and was consistently 91%. She denied recent URI. She is having SOB and orthopnea. Dr. Jimmey Ralph arrived in room and I notified him. I ordered CXR. I walked around cath lab short stay looking for staff to notify someone, but no staff were present except a NT checking a patient in. I saw another patient and I came back again and walked around the unit for several minutes and finally found an Charity fundraiser and notified  her of the CXR order. Delayed to room waiting on CXR. Reviewed prior to start. No changes from prior.)       Anesthesia Quick Evaluation

## 2023-11-13 ENCOUNTER — Other Ambulatory Visit: Payer: Self-pay

## 2023-11-13 ENCOUNTER — Ambulatory Visit (HOSPITAL_BASED_OUTPATIENT_CLINIC_OR_DEPARTMENT_OTHER): Payer: Self-pay | Admitting: Anesthesiology

## 2023-11-13 ENCOUNTER — Ambulatory Visit (HOSPITAL_COMMUNITY)
Admission: RE | Disposition: A | Discharge status: Home / Self Care | Payer: Medicare HMO | Source: Home / Self Care | Attending: Cardiology

## 2023-11-13 ENCOUNTER — Ambulatory Visit (HOSPITAL_COMMUNITY)
Admission: RE | Admit: 2023-11-13 | Discharge: 2023-11-14 | Disposition: A | Discharge status: Home / Self Care | Payer: Medicare HMO | Attending: Cardiology | Admitting: Cardiology

## 2023-11-13 ENCOUNTER — Ambulatory Visit (HOSPITAL_BASED_OUTPATIENT_CLINIC_OR_DEPARTMENT_OTHER): Payer: Medicare HMO

## 2023-11-13 ENCOUNTER — Ambulatory Visit (HOSPITAL_COMMUNITY): Payer: Medicare HMO

## 2023-11-13 ENCOUNTER — Encounter (HOSPITAL_COMMUNITY): Payer: Self-pay | Admitting: Cardiology

## 2023-11-13 ENCOUNTER — Ambulatory Visit (HOSPITAL_COMMUNITY): Payer: Self-pay | Admitting: Anesthesiology

## 2023-11-13 DIAGNOSIS — R0602 Shortness of breath: Secondary | ICD-10-CM | POA: Diagnosis not present

## 2023-11-13 DIAGNOSIS — I10 Essential (primary) hypertension: Secondary | ICD-10-CM | POA: Insufficient documentation

## 2023-11-13 DIAGNOSIS — Z7901 Long term (current) use of anticoagulants: Secondary | ICD-10-CM | POA: Insufficient documentation

## 2023-11-13 DIAGNOSIS — I3139 Other pericardial effusion (noninflammatory): Secondary | ICD-10-CM | POA: Insufficient documentation

## 2023-11-13 DIAGNOSIS — Z79899 Other long term (current) drug therapy: Secondary | ICD-10-CM | POA: Insufficient documentation

## 2023-11-13 DIAGNOSIS — E785 Hyperlipidemia, unspecified: Secondary | ICD-10-CM | POA: Insufficient documentation

## 2023-11-13 DIAGNOSIS — I2510 Atherosclerotic heart disease of native coronary artery without angina pectoris: Secondary | ICD-10-CM | POA: Diagnosis not present

## 2023-11-13 DIAGNOSIS — I480 Paroxysmal atrial fibrillation: Secondary | ICD-10-CM | POA: Diagnosis not present

## 2023-11-13 DIAGNOSIS — D6869 Other thrombophilia: Secondary | ICD-10-CM | POA: Insufficient documentation

## 2023-11-13 DIAGNOSIS — K219 Gastro-esophageal reflux disease without esophagitis: Secondary | ICD-10-CM | POA: Diagnosis not present

## 2023-11-13 DIAGNOSIS — I4891 Unspecified atrial fibrillation: Secondary | ICD-10-CM | POA: Diagnosis present

## 2023-11-13 HISTORY — PX: ATRIAL FIBRILLATION ABLATION: EP1191

## 2023-11-13 LAB — TYPE AND SCREEN
ABO/RH(D): O POS
Antibody Screen: NEGATIVE

## 2023-11-13 LAB — ECHOCARDIOGRAM LIMITED
Height: 64 in
Weight: 2480 [oz_av]

## 2023-11-13 LAB — ABO/RH: ABO/RH(D): O POS

## 2023-11-13 LAB — POCT ACTIVATED CLOTTING TIME: Activated Clotting Time: 371 s

## 2023-11-13 SURGERY — ATRIAL FIBRILLATION ABLATION
Anesthesia: General

## 2023-11-13 MED ORDER — ONDANSETRON HCL 4 MG/2ML IJ SOLN
INTRAMUSCULAR | Status: DC | PRN
  Administered 2023-11-13: 4 mg via INTRAVENOUS

## 2023-11-13 MED ORDER — FENTANYL CITRATE (PF) 100 MCG/2ML IJ SOLN
INTRAMUSCULAR | Status: AC
  Filled 2023-11-13: qty 2

## 2023-11-13 MED ORDER — PHENYLEPHRINE 80 MCG/ML (10ML) SYRINGE FOR IV PUSH (FOR BLOOD PRESSURE SUPPORT)
PREFILLED_SYRINGE | INTRAVENOUS | Status: DC | PRN
  Administered 2023-11-13: 80 ug via INTRAVENOUS
  Administered 2023-11-13: 160 ug via INTRAVENOUS

## 2023-11-13 MED ORDER — ONDANSETRON HCL 4 MG/2ML IJ SOLN: 4.0000 mg | Freq: Four times a day (QID) | INTRAMUSCULAR | Status: DC | PRN

## 2023-11-13 MED ORDER — FENTANYL CITRATE (PF) 250 MCG/5ML IJ SOLN
INTRAMUSCULAR | Status: DC | PRN
Start: 2023-11-13 — End: 2023-11-13
  Administered 2023-11-13: 100 ug via INTRAVENOUS

## 2023-11-13 MED ORDER — ACETAMINOPHEN 325 MG PO TABS
ORAL_TABLET | ORAL | Status: AC
Start: 2023-11-13 — End: 2023-11-13
  Administered 2023-11-13: 650 mg via ORAL
  Filled 2023-11-13: qty 2

## 2023-11-13 MED ORDER — ACETAMINOPHEN 500 MG PO TABS: 1000.0000 mg | ORAL_TABLET | Freq: Once | ORAL | Status: DC

## 2023-11-13 MED ORDER — KETOROLAC TROMETHAMINE 30 MG/ML IJ SOLN
30.0000 mg | Freq: Once | INTRAMUSCULAR | Status: AC
  Administered 2023-11-13: 30 mg via INTRAVENOUS
  Filled 2023-11-13: qty 1

## 2023-11-13 MED ORDER — GLYCOPYRROLATE 0.2 MG/ML IJ SOLN
INTRAMUSCULAR | Status: DC | PRN
  Administered 2023-11-13: .2 mg via INTRAVENOUS

## 2023-11-13 MED ORDER — ATROPINE SULFATE 1 MG/10ML IJ SOSY
PREFILLED_SYRINGE | INTRAMUSCULAR | Status: DC | PRN
  Administered 2023-11-13: 1 mg via INTRAVENOUS

## 2023-11-13 MED ORDER — ATROPINE SULFATE 1 MG/10ML IJ SOSY
PREFILLED_SYRINGE | INTRAMUSCULAR | Status: AC
Start: 2023-11-13 — End: ?
  Filled 2023-11-13: qty 10

## 2023-11-13 MED ORDER — PROTAMINE SULFATE 10 MG/ML IV SOLN
INTRAVENOUS | Status: DC | PRN
  Administered 2023-11-13: 35 mg via INTRAVENOUS

## 2023-11-13 MED ORDER — SODIUM CHLORIDE 0.9% FLUSH
3.0000 mL | Freq: Two times a day (BID) | INTRAVENOUS | Status: DC
  Administered 2023-11-13: 3 mL via INTRAVENOUS

## 2023-11-13 MED ORDER — SUCCINYLCHOLINE CHLORIDE 200 MG/10ML IV SOSY: PREFILLED_SYRINGE | INTRAVENOUS | Status: DC | PRN

## 2023-11-13 MED ORDER — APIXABAN 5 MG PO TABS
5.0000 mg | ORAL_TABLET | Freq: Two times a day (BID) | ORAL | Status: DC
  Administered 2023-11-13: 5 mg via ORAL
  Filled 2023-11-13: qty 1

## 2023-11-13 MED ORDER — LIDOCAINE 2% (20 MG/ML) 5 ML SYRINGE
INTRAMUSCULAR | Status: DC | PRN
  Administered 2023-11-13: 70 mg via INTRAVENOUS

## 2023-11-13 MED ORDER — PROPOFOL 10 MG/ML IV BOLUS
INTRAVENOUS | Status: DC | PRN
  Administered 2023-11-13: 100 mg via INTRAVENOUS

## 2023-11-13 MED ORDER — CHLORHEXIDINE GLUCONATE CLOTH 2 % EX PADS: 6.0000 | MEDICATED_PAD | Freq: Once | CUTANEOUS | Status: DC

## 2023-11-13 MED ORDER — VITAMIN B-12 1000 MCG PO TABS: 1000.0000 ug | ORAL_TABLET | Freq: Every day | ORAL | Status: DC

## 2023-11-13 MED ORDER — VANCOMYCIN HCL IN DEXTROSE 1-5 GM/200ML-% IV SOLN
1000.0000 mg | INTRAVENOUS | Status: DC
  Filled 2023-11-13: qty 200

## 2023-11-13 MED ORDER — ALUM & MAG HYDROXIDE-SIMETH 200-200-20 MG/5ML PO SUSP: 30.0000 mL | Freq: Once | ORAL | Status: DC

## 2023-11-13 MED ORDER — MONTELUKAST SODIUM 10 MG PO TABS
10.0000 mg | ORAL_TABLET | Freq: Every day | ORAL | Status: DC
Start: 2023-11-13 — End: 2023-11-14
  Administered 2023-11-13: 10 mg via ORAL
  Filled 2023-11-13: qty 1

## 2023-11-13 MED ORDER — SUGAMMADEX SODIUM 200 MG/2ML IV SOLN
INTRAVENOUS | Status: DC | PRN
  Administered 2023-11-13: 200 mg via INTRAVENOUS

## 2023-11-13 MED ORDER — APIXABAN 5 MG PO TABS: 5.0000 mg | ORAL_TABLET | Freq: Two times a day (BID) | ORAL | Status: DC

## 2023-11-13 MED ORDER — ACETAMINOPHEN 325 MG PO TABS: 650.0000 mg | ORAL_TABLET | ORAL | Status: DC | PRN

## 2023-11-13 MED ORDER — HEPARIN SODIUM (PORCINE) 1000 UNIT/ML IJ SOLN
INTRAMUSCULAR | Status: DC | PRN
  Administered 2023-11-13: 12000 [IU] via INTRAVENOUS

## 2023-11-13 MED ORDER — VITAMIN D 25 MCG (1000 UNIT) PO TABS: 2000.0000 [IU] | ORAL_TABLET | Freq: Every day | ORAL | Status: DC

## 2023-11-13 MED ORDER — PHENYLEPHRINE HCL-NACL 20-0.9 MG/250ML-% IV SOLN
INTRAVENOUS | Status: DC | PRN
  Administered 2023-11-13: 40 ug/min via INTRAVENOUS

## 2023-11-13 MED ORDER — IPRATROPIUM-ALBUTEROL 0.5-2.5 (3) MG/3ML IN SOLN
3.0000 mL | Freq: Once | RESPIRATORY_TRACT | Status: AC
  Administered 2023-11-13: 3 mL via RESPIRATORY_TRACT
  Filled 2023-11-13: qty 3

## 2023-11-13 MED ORDER — EPHEDRINE SULFATE-NACL 50-0.9 MG/10ML-% IV SOSY
PREFILLED_SYRINGE | INTRAVENOUS | Status: DC | PRN
Start: 2023-11-13 — End: 2023-11-13
  Administered 2023-11-13: 10 mg via INTRAVENOUS

## 2023-11-13 MED ORDER — SODIUM CHLORIDE 0.9 % IV SOLN: 250.0000 mL | INTRAVENOUS | Status: DC | PRN

## 2023-11-13 MED ORDER — DEXAMETHASONE SODIUM PHOSPHATE 10 MG/ML IJ SOLN
INTRAMUSCULAR | Status: DC | PRN
  Administered 2023-11-13: 10 mg via INTRAVENOUS

## 2023-11-13 MED ORDER — SODIUM CHLORIDE 0.9 % IV SOLN: INTRAVENOUS | Status: DC

## 2023-11-13 MED ORDER — AMIODARONE HCL 200 MG PO TABS: 200.0000 mg | ORAL_TABLET | Freq: Every day | ORAL | 0 refills | Status: DC

## 2023-11-13 MED ORDER — CIPROFLOXACIN IN D5W 400 MG/200ML IV SOLN
400.0000 mg | Freq: Once | INTRAVENOUS | Status: AC
  Administered 2023-11-13: 400 mg via INTRAVENOUS
  Filled 2023-11-13: qty 200

## 2023-11-13 MED ORDER — ROCURONIUM BROMIDE 10 MG/ML (PF) SYRINGE
PREFILLED_SYRINGE | INTRAVENOUS | Status: DC | PRN
  Administered 2023-11-13: 50 mg via INTRAVENOUS

## 2023-11-13 MED ORDER — PRAVASTATIN SODIUM 40 MG PO TABS
40.0000 mg | ORAL_TABLET | Freq: Every evening | ORAL | Status: DC
  Administered 2023-11-13: 40 mg via ORAL
  Filled 2023-11-13: qty 1

## 2023-11-13 MED ORDER — SODIUM CHLORIDE 0.9% FLUSH: 3.0000 mL | INTRAVENOUS | Status: DC | PRN

## 2023-11-13 MED ORDER — PANTOPRAZOLE SODIUM 40 MG PO TBEC
40.0000 mg | DELAYED_RELEASE_TABLET | Freq: Once | ORAL | Status: AC
  Administered 2023-11-13: 40 mg via ORAL
  Filled 2023-11-13: qty 1

## 2023-11-13 MED ORDER — PANTOPRAZOLE SODIUM 40 MG PO TBEC: 40.0000 mg | DELAYED_RELEASE_TABLET | Freq: Every day | ORAL | Status: DC

## 2023-11-13 MED ORDER — HEPARIN (PORCINE) IN NACL 1000-0.9 UT/500ML-% IV SOLN
INTRAVENOUS | Status: DC | PRN
  Administered 2023-11-13 (×3): 500 mL

## 2023-11-13 SURGICAL SUPPLY — 20 items
BLANKET WARM UNDERBOD FULL ACC (MISCELLANEOUS) ×1
CABLE PFA RX CATH CONN (CABLE) ×1
CATH 8FR REPROCESSED SOUNDSTAR (CATHETERS) ×1
CATH BI DIR 7FR CS F-J 12 PIN (CATHETERS) ×1
CATH FARAWAVE ABLATION 31 (CATHETERS) ×1
CATH OCTARAY 2.0 F 3-3-3-3-3 (CATHETERS) ×1
CATH WEBSTER BI DIR CS D-F CRV (CATHETERS) ×1
CLOSURE PERCLOSE PROSTYLE (VASCULAR PRODUCTS) ×2
COVER SWIFTLINK CONNECTOR (BAG) ×1
DEVICE CLOSURE MYNXGRIP 6/7F (Vascular Products) ×2 IMPLANT
DILATOR VESSEL 38 20CM 16FR (INTRODUCER) ×1
INQWIRE 1.5J .035X260CM (WIRE) ×1
KIT VERSACROSS CNCT FARADRIVE (KITS) ×1
PACK EP LF (CUSTOM PROCEDURE TRAY) ×1
PAD DEFIB RADIO PHYSIO CONN (PAD) ×1
PATCH CARTO3 (PAD) ×1
SHEATH FARADRIVE STEERABLE (SHEATH) ×1
SHEATH PINNACLE 8F 10CM (SHEATH) ×2
SHEATH PINNACLE 9F 10CM (SHEATH) ×1
SHEATH PROBE COVER 6X72 (BAG) ×1

## 2023-11-13 NOTE — Progress Notes (Signed)
 4 hour bedrest completed at 1350. Pt ambulated to bathroom. Femoral access sites remain stable, level zero at this time. +2 palpable bilateral DPs.

## 2023-11-13 NOTE — Anesthesia Postprocedure Evaluation (Signed)
 Anesthesia Post Note  Patient: Tracey Henson  Procedure(s) Performed: ATRIAL FIBRILLATION ABLATION     Patient location during evaluation: PACU Anesthesia Type: General Level of consciousness: sedated and patient cooperative Pain management: pain level controlled Vital Signs Assessment: post-procedure vital signs reviewed and stable Respiratory status: spontaneous breathing Cardiovascular status: stable Anesthetic complications: no Comments: Pt with some chest discomfort postop and continued marginal SpO2 as in pre procedure. She doesn't lie flat at home because of her orthopnea and chest pressure. She states her symptoms are like that, but worse. She is lying flat per the cath lab protocol for groin access. Her lungs are clear and her EKG are unremarkable. Discussed with Dr. Jimmey Ralph and he came to evaluate her as well. She will stay overnight for observation. Her symptoms continued to improve as time passed.    There were no known notable events for this encounter.  Last Vitals:  Vitals:   11/13/23 1315 11/13/23 1330  BP: 133/60 (!) 128/56  Pulse: 65 66  Resp: 14 16  Temp:    SpO2: 91% 93%    Last Pain:  Vitals:   11/13/23 1030  TempSrc: Axillary  PainSc:                  Lewie Loron

## 2023-11-13 NOTE — Anesthesia Procedure Notes (Signed)
 Procedure Name: Intubation Date/Time: 11/13/2023 8:09 AM  Performed by: Georgianne Fick D, CRNAPre-anesthesia Checklist: Patient identified, Emergency Drugs available, Suction available and Patient being monitored Patient Re-evaluated:Patient Re-evaluated prior to induction Oxygen Delivery Method: Circle System Utilized Preoxygenation: Pre-oxygenation with 100% oxygen Induction Type: IV induction Ventilation: Mask ventilation without difficulty Laryngoscope Size: Mac and 3 Grade View: Grade I Tube type: Oral Tube size: 7.0 mm Number of attempts: 1 Airway Equipment and Method: Stylet and Oral airway Placement Confirmation: ETT inserted through vocal cords under direct vision, positive ETCO2 and breath sounds checked- equal and bilateral Secured at: 22 cm Tube secured with: Tape Dental Injury: Teeth and Oropharynx as per pre-operative assessment

## 2023-11-13 NOTE — Transfer of Care (Signed)
 Immediate Anesthesia Transfer of Care Note  Patient: Macil Crady  Procedure(s) Performed: ATRIAL FIBRILLATION ABLATION  Patient Location: PACU  Anesthesia Type:General  Level of Consciousness: awake, alert , and oriented  Airway & Oxygen Therapy: Patient Spontanous Breathing and Patient connected to face mask oxygen  Post-op Assessment: Report given to RN and Post -op Vital signs reviewed and stable  Post vital signs: Reviewed and stable  Last Vitals:  Vitals Value Taken Time  BP 139/54 11/13/23 1001  Temp    Pulse 84 11/13/23 1005  Resp 14 11/13/23 1005  SpO2 93 % 11/13/23 1005  Vitals shown include unfiled device data.  Last Pain:  Vitals:   11/13/23 0600  TempSrc: Oral  PainSc:          Complications: There were no known notable events for this encounter.

## 2023-11-13 NOTE — Discharge Instructions (Signed)
 Stop amiodarone in 1 month.

## 2023-11-13 NOTE — Interval H&P Note (Signed)
 History and Physical Interval Note:  11/13/2023 7:12 AM  Tracey Henson  has presented today for surgery, with the diagnosis of symptomatic paroxysmal atrial fibrillation.  The various methods of treatment have been discussed with the patient and family. After consideration of risks, benefits and other options for treatment, the patient has consented to  Procedure(s): ATRIAL FIBRILLATION ABLATION (N/A) as a surgical intervention.  The patient's history has been reviewed, patient examined, no change in status, stable for surgery.  I have reviewed the patient's chart and labs.  Questions were answered to the patient's satisfaction.     [name]

## 2023-11-14 ENCOUNTER — Telehealth: Payer: Self-pay | Admitting: Diagnostic Neuroimaging

## 2023-11-14 ENCOUNTER — Telehealth (HOSPITAL_COMMUNITY): Payer: Self-pay

## 2023-11-14 DIAGNOSIS — D6869 Other thrombophilia: Secondary | ICD-10-CM | POA: Diagnosis not present

## 2023-11-14 DIAGNOSIS — I2510 Atherosclerotic heart disease of native coronary artery without angina pectoris: Secondary | ICD-10-CM | POA: Diagnosis not present

## 2023-11-14 DIAGNOSIS — I480 Paroxysmal atrial fibrillation: Secondary | ICD-10-CM | POA: Diagnosis not present

## 2023-11-14 DIAGNOSIS — E785 Hyperlipidemia, unspecified: Secondary | ICD-10-CM | POA: Diagnosis not present

## 2023-11-14 MED ORDER — ACETAMINOPHEN 325 MG PO TABS: 650.0000 mg | ORAL_TABLET | ORAL | Status: AC | PRN

## 2023-11-14 MED ORDER — AMIODARONE HCL 200 MG PO TABS: 200.0000 mg | ORAL_TABLET | Freq: Every day | ORAL | 0 refills | Status: DC

## 2023-11-14 MED FILL — Fentanyl Citrate Preservative Free (PF) Inj 100 MCG/2ML: INTRAMUSCULAR | Qty: 2 | Status: AC

## 2023-11-14 NOTE — Discharge Summary (Addendum)
 ELECTROPHYSIOLOGY PROCEDURE DISCHARGE SUMMARY    Patient ID: Mckenley Birenbaum,  MRN: [id number], DOB/AGE: 1950-12-06 73 y.o.  Admit date: 11/13/2023 Discharge date: 11/14/2023  Primary Care Physician: Odis Luster, PA-C  Primary Cardiologist: Donato Schultz, MD  Electrophysiologist: Dr. 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  Electrophysiology study and radiofrequency catheter ablation of Atrial Fibrillation on 11/13/2023 by Dr. Jimmey Ralph .  This study demonstrated ;   1. Successful PVI. 2. Successful ablation/isolation of the posterior wall. 3. Intracardiac echo reveals normal LV size and function and a small pericardial effusion most prominent at the LV apex.  4. No early apparent complications. 5. Resume Eliquis in the recovery area.  6. Stop amiodarone in 1 month. Ii. Limited echo - Small pericardial effusion stable from prior 06/2023, EF 60-65%     Brief HPI: Evelene Roussin is a 73 y.o. female with a history of Atrial Fibrillation.  They have failed medical therapy with amiodarone. Risks, benefits, and alternatives to catheter ablation of Atrial Fibrillation were reviewed with the patient who wished to proceed.   The patient has been on uninterrupted anticoagulation for more than 3 weeks and did not require TEE.  Hospital Course:  The patient was admitted and underwent EPS/RFCA of Atrial Fibrillation with details as outlined above.  They were monitored on telemetry overnight which demonstrated NSR. Pt had mild chest discomfort post op. Echo was WNL and resolved once she had food and drink.  Groin was without complication on the day of discharge.  The patient was examined and considered to be stable for discharge.  Wound care and restrictions were reviewed with the patient.  The patient will be seen back by Afib Clinic in 4 weeks and EP APP in 12 weeks for  post ablation follow up.   CHA2DS2VASC is at least 3.   Physical Exam: Vitals:   11/13/23 1704 11/13/23 1946 11/14/23 0014 11/14/23 0417  BP: (!) 112/48 (!) 117/55 (!) 102/47 (!) 100/34  Pulse: 69 72 62 64  Resp:  16 16 18   Temp:  97.9 F (36.6 C) 97.8 F (36.6 C) 97.6 F (36.4 C)  TempSrc:  Oral Oral Oral  SpO2: 92% 92% 96% 97%  Weight:      Height:        GEN- NAD. A&O x 3.  HEENT: Normocephalic, atraumatic Lungs- CTAB, Normal effort.  Heart- RRR, No M/G/R.  GI- Soft, NT, ND.  Extremities- No clubbing, cyanosis, or edema;  Skin- warm and dry, no rash or lesion, left chest without hematoma/ecchymosis  Discharge Medications:  Allergies as of 11/14/2023       Reactions   Vancomycin Rash   Redness of skin   Atorvastatin Other (See Comments)   Cefaclor Hives   Codeine    Slows heart rate right down   Crestor [rosuvastatin] Nausea And Vomiting   Gabapentin    Muscle aches   Nexlizet [bempedoic Acid-ezetimibe] Other (See Comments)   Oxybutynin Other (See Comments)   Tongue burning        Medication List     TAKE these medications    acetaminophen 325 MG tablet Commonly known as: TYLENOL Take 2 tablets (650 mg total) by mouth every 4 (four) hours as needed for headache or mild pain (pain score 1-3).   amiodarone 200 MG tablet Commonly known as: PACERONE Take 1 tablet (200 mg total)  by mouth daily for 28 days.  Take 200 mg  daily. What changed: See the new instructions.   amitriptyline 25 MG tablet Commonly known as: ELAVIL TAKE 1 TABLET BY MOUTH EVERYDAY AT BEDTIME What changed: See the new instructions.   apixaban 5 MG Tabs tablet Commonly known as: ELIQUIS Take 1 tablet (5 mg total) by mouth 2 (two) times daily.   cyanocobalamin 1000 MCG tablet Take 1,000 mcg by mouth daily.   diltiazem 30 MG tablet Commonly known as: Cardizem Take 1 tablet (30 mg total) by mouth every 6 (six) hours as needed. What changed: reasons to take this   metoprolol  succinate 25 MG 24 hr tablet Commonly known as: Toprol XL Take 0.5 tablets (12.5 mg total) by mouth daily.   montelukast 10 MG tablet Commonly known as: SINGULAIR TAKE 1 TABLET BY MOUTH EVERYDAY AT BEDTIME   Nurtec 75 MG Tbdp Generic drug: Rimegepant Sulfate Take 1 tablet (75 mg total) by mouth daily as needed.   omeprazole 40 MG capsule Commonly known as: PRILOSEC TAKE 1 CAPSULE EVERY DAY   pravastatin 40 MG tablet Commonly known as: PRAVACHOL Take 1 tablet (40 mg total) by mouth every evening.   Repatha SureClick 140 MG/ML Soaj Generic drug: Evolocumab INJECT 140MG  INTO THE SKIN EVERY 14 DAYS   valACYclovir 500 MG tablet Commonly known as: VALTREX Take 500 mg by mouth every other day.   Vitamin D3 50 MCG (2000 UT) Tabs Take 2,000 Units by mouth daily.   ZINC GLUCONATE PO Take 8 mg by mouth daily. Elderberry 100 mg        Disposition: Home with usual follow up as in AVS  Signed, Graciella Freer, PA-C  11/14/2023 8:34 AM  I have seen, examined the patient, and reviewed the above assessment and plan.    Hospital Course: Patient with a history of symptomatic paroxysmal atrial fibrillation. Presented on 2/24 for scheduled ablation. Underwent successful PVI and posterior wall isolation on 2/24. Patient had some chest pain in recovery area which she stated felt like indigestion. Pain resolved with ginger ale. She also had some hypoxia requiring supplemental oxygen in recovery. Was kept overnight for monitoring. No acute overnight events. Maintained sinus rhythm. Oxygen weaned off. No further episodes of chest discomfort. Feeling well this morning with no new or acute complaints.   GEN: No acute distress.   Cardiac: Normal rate, regular rhythm Groins: Soft bilaterally without hematoma or bleeding Psych: Normal affect   Plan: Continue uninterrupted Eliquis for 3 months. Stop amiodarone in 1 month. No heavy lifting or strenuous activity for 1 week.   Duration of  Discharge Encounter:  APP time: 20 minutes MD time: 25 minutes  [name], MD 11/14/2023 9:12 PM

## 2023-11-14 NOTE — Telephone Encounter (Signed)
 Attempted to reach patient to follow up with procedure completed on 11/13/23, no answer. Left VM for patient to return call.

## 2023-11-14 NOTE — Telephone Encounter (Signed)
 At 10:57 this morning pt left a vm asking her appointment be cx

## 2023-11-14 NOTE — Telephone Encounter (Signed)
 noted

## 2023-11-14 NOTE — Plan of Care (Signed)
  Problem: Education: Goal: Knowledge of General Education information will improve Description: Including pain rating scale, medication(s)/side effects and non-pharmacologic comfort measures Outcome: Progressing   Problem: Health Behavior/Discharge Planning: Goal: Ability to manage health-related needs will improve Outcome: Progressing   Problem: Clinical Measurements: Goal: Ability to maintain clinical measurements within normal limits will improve Outcome: Progressing Goal: Will remain free from infection Outcome: Progressing Goal: Diagnostic test results will improve Outcome: Progressing Goal: Respiratory complications will improve Outcome: Progressing Goal: Cardiovascular complication will be avoided Outcome: Progressing   Problem: Activity: Goal: Risk for activity intolerance will decrease Outcome: Progressing   Problem: Nutrition: Goal: Adequate nutrition will be maintained Outcome: Progressing   Problem: Coping: Goal: Level of anxiety will decrease Outcome: Progressing   Problem: Elimination: Goal: Will not experience complications related to bowel motility Outcome: Progressing Goal: Will not experience complications related to urinary retention Outcome: Progressing   Problem: Pain Managment: Goal: General experience of comfort will improve and/or be controlled Outcome: Progressing   Problem: Safety: Goal: Ability to remain free from injury will improve Outcome: Progressing   Problem: Skin Integrity: Goal: Risk for impaired skin integrity will decrease Outcome: Progressing   Problem: Education: Goal: Understanding of disease, treatment, and recovery process will improve Outcome: Progressing   Problem: Activity: Goal: Ability to return to baseline activity level will improve Outcome: Progressing   Problem: Cardiac: Goal: Ability to maintain adequate cardiovascular perfusion will improve Outcome: Progressing Goal: Vascular access site(s) Level 0-1  will be maintained Outcome: Progressing   Problem: Health Behavior/ Discharge Planning: Goal: Ability to safely manage health related needs after discharge Outcome: Progressing   Problem: Education: Goal: Knowledge of disease or condition will improve Outcome: Progressing Goal: Understanding of medication regimen will improve Outcome: Progressing Goal: Individualized Educational Video(s) Outcome: Progressing   Problem: Activity: Goal: Ability to tolerate increased activity will improve Outcome: Progressing   Problem: Cardiac: Goal: Ability to achieve and maintain adequate cardiopulmonary perfusion will improve Outcome: Progressing   Problem: Health Behavior/Discharge Planning: Goal: Ability to safely manage health-related needs after discharge will improve Outcome: Progressing

## 2023-11-15 ENCOUNTER — Encounter: Payer: Self-pay | Admitting: Cardiology

## 2023-11-15 NOTE — Telephone Encounter (Signed)
 Left VM for patient to return call

## 2023-11-15 NOTE — Telephone Encounter (Signed)
 Spoke with patient to complete post procedure follow up call.   Patient reports no complications with groin sites. She does report experiencing indigestion symptoms in the mid-sternal area since procedure. It is non-radiating or associated with activity or any other symptoms. Rates pain 8/10. She takes omeprazole 40 mg daily. Advised patient she may experience these symptoms temporarily post procedure and should resolve. Will discuss with Dr. Jimmey Ralph for any additional recommendations.   Instructions reviewed with patient:  Remove large bandage at puncture site after 24 hours. It is normal to have bruising, tenderness and a pea or marble sized lump/knot at the groin site which can take up to three months to resolve.  Get help right away if you notice sudden swelling at the puncture site.  Check your puncture site every day for signs of infection: fever, redness, swelling, pus drainage, warmth, foul odor or excessive pain. If this occurs, please call the office at [phone], to speak with the nurse. Get help right away if your puncture site is bleeding and the bleeding does not stop after applying firm pressure to the area.  You may continue to have skipped beats/ atrial fibrillation during the first several months after your procedure.  It is very important not to miss any doses of your blood thinner Eliquis. Patient restarted taking this medication on 11/13/23.   You will follow up with the Afib clinic on 12/11/23 and with the APP on 02/09/24. Patient verbalized understanding to all instructions provided.

## 2023-11-16 ENCOUNTER — Encounter: Payer: Self-pay | Admitting: Emergency Medicine

## 2023-11-17 ENCOUNTER — Encounter: Payer: Self-pay | Admitting: Cardiology

## 2023-11-17 NOTE — Telephone Encounter (Signed)
 Called and spoke with patient who states she is not having any shortness of breath, difficulty swallowing or swelling of mouth or throat.  States she felt one bump on the back of her neck last night and then today she has noticed these red rash area increasing on her trunk and arms.  States she called the PCP who informed her to call this office as she had a cadiac precedure 4 days ago and is concerned that the rash is related to that.  Advised that I will forward her message to Dr Jimmey Ralph and continue to monitor.  Tracey Henson [facility] [employer].

## 2023-11-20 ENCOUNTER — Telehealth: Payer: Medicare HMO | Admitting: Diagnostic Neuroimaging

## 2023-11-21 NOTE — Telephone Encounter (Signed)
 This is not my patient and I have not seen her before. Please send back to E2C2.  Tracey Henson. Jimmey Ralph, MD 11/21/2023 12:31 PM

## 2023-12-11 ENCOUNTER — Ambulatory Visit (HOSPITAL_COMMUNITY)
Admission: RE | Admit: 2023-12-11 | Discharge: 2023-12-11 | Disposition: A | Discharge status: Home / Self Care | Payer: Medicare HMO | Source: Ambulatory Visit | Attending: Physician Assistant | Admitting: Physician Assistant

## 2023-12-11 ENCOUNTER — Encounter (HOSPITAL_COMMUNITY): Payer: Self-pay | Admitting: Physician Assistant

## 2023-12-11 VITALS — BP 142/72 | HR 64 | Ht 64.0 in | Wt 159.4 lb

## 2023-12-11 DIAGNOSIS — I2510 Atherosclerotic heart disease of native coronary artery without angina pectoris: Secondary | ICD-10-CM | POA: Insufficient documentation

## 2023-12-11 DIAGNOSIS — E785 Hyperlipidemia, unspecified: Secondary | ICD-10-CM | POA: Diagnosis not present

## 2023-12-11 DIAGNOSIS — I480 Paroxysmal atrial fibrillation: Secondary | ICD-10-CM | POA: Diagnosis present

## 2023-12-11 DIAGNOSIS — Z7901 Long term (current) use of anticoagulants: Secondary | ICD-10-CM | POA: Diagnosis not present

## 2023-12-11 DIAGNOSIS — D6869 Other thrombophilia: Secondary | ICD-10-CM | POA: Insufficient documentation

## 2023-12-11 DIAGNOSIS — Z79899 Other long term (current) drug therapy: Secondary | ICD-10-CM | POA: Insufficient documentation

## 2023-12-11 NOTE — Progress Notes (Signed)
 Primary Care Physician: Odis Luster, PA-C Primary Cardiologist: Donato Schultz, MD Electrophysiologist: [name], MD  Referring Physician: Dr Wilma Flavin Tracey Henson is a 73 y.o. female with a history of CAD, HLD, atrial fibrillation who presents for follow up in the [facility]. Patient presented to ED on 09/21/23 with a heart rate of 143bpm. New diagnosis of atrial fibrillation. She was started on metoprolol and Eliquis for stroke prevention. She was started on amiodarone as a bridge to ablation.   Patient presents today for follow up for atrial fibrillation. She reports that her afib has improved since the procedure. She has tachypalpitations but they only last ~ 2 minutes. She denies chest pain or groin issues. No bleeding issues on anticoagulation.   Today, she denies symptoms of chest pain, shortness of breath, orthopnea, PND, lower extremity edema, dizziness, presyncope, syncope, snoring, daytime somnolence, bleeding, or neurologic sequela. The patient is tolerating medications without difficulties and is otherwise without complaint today.    Atrial Fibrillation Risk Factors:  she does not have symptoms or diagnosis of sleep apnea. she does not have a history of rheumatic fever.   Atrial Fibrillation Management history:  Previous antiarrhythmic drugs: amiodarone  Previous cardioversions: none Previous ablations: 10/30/23 Anticoagulation history: Eliquis  ROS- All systems are reviewed and negative except as per the HPI above.  Past Medical History:  Diagnosis Date   Asthma    seasonal   Baker cyst    left leg   Baker's cyst, left    left leg   Breast cyst    Frequent headaches    GERD (gastroesophageal reflux disease)    Hearing loss    bilateral   Hypercholesteremia    Muscle cramps    Vertigo     Current Outpatient Medications  Medication Sig Dispense Refill   acetaminophen (TYLENOL) 325 MG tablet Take 2 tablets (650 mg  total) by mouth every 4 (four) hours as needed for headache or mild pain (pain score 1-3).     amitriptyline (ELAVIL) 25 MG tablet TAKE 1 TABLET BY MOUTH EVERYDAY AT BEDTIME (Patient taking differently: Take 25 mg by mouth at bedtime as needed (Headache).) 90 tablet 2   apixaban (ELIQUIS) 5 MG TABS tablet Take 1 tablet (5 mg total) by mouth 2 (two) times daily. 60 tablet 6   Cholecalciferol (VITAMIN D3) 50 MCG (2000 UT) TABS Take 2,000 Units by mouth daily.     cyanocobalamin 1000 MCG tablet Take 1,000 mcg by mouth daily.     diltiazem (CARDIZEM) 30 MG tablet Take 1 tablet (30 mg total) by mouth every 6 (six) hours as needed. (Patient taking differently: Take 30 mg by mouth every 6 (six) hours as needed (AFIB).) 30 tablet 3   Evolocumab (REPATHA SURECLICK) 140 MG/ML SOAJ INJECT 140MG  INTO THE SKIN EVERY 14 DAYS 6 mL 3   metoprolol succinate (TOPROL XL) 25 MG 24 hr tablet Take 0.5 tablets (12.5 mg total) by mouth daily. 45 tablet 3   montelukast (SINGULAIR) 10 MG tablet TAKE 1 TABLET BY MOUTH EVERYDAY AT BEDTIME 90 tablet 0   omeprazole (PRILOSEC) 40 MG capsule TAKE 1 CAPSULE EVERY DAY 90 capsule 3   pravastatin (PRAVACHOL) 40 MG tablet Take 1 tablet (40 mg total) by mouth every evening. 90 tablet 3   Rimegepant Sulfate (NURTEC) 75 MG TBDP Take 1 tablet (75 mg total) by mouth daily as needed. 8 tablet 6   valACYclovir (VALTREX) 500 MG tablet Take 500  mg by mouth every other day.     ZINC GLUCONATE PO Take 8 mg by mouth daily. Elderberry 100 mg     No current facility-administered medications for this encounter.    Physical Exam: BP (!) 142/72   Pulse 64   Ht 5\' 4"  (1.626 m)   Wt 72.3 kg   LMP  (LMP Unknown)   BMI 27.36 kg/m   GEN: Well nourished, well developed in no acute distress NECK: No JVD; No carotid bruits CARDIAC: Regular rate and rhythm, no murmurs, rubs, gallops RESPIRATORY:  Clear to auscultation without rales, wheezing or rhonchi  ABDOMEN: Soft, non-tender,  non-distended EXTREMITIES:  No edema; No deformity   Wt Readings from Last 3 Encounters:  12/11/23 72.3 kg  11/13/23 70.3 kg  10/30/23 71.4 kg     EKG today demonstrates  SR, NST Vent. rate 64 BPM PR interval 140 ms QRS duration 80 ms QT/QTcB 452/466 ms   Echo 11/13/23 demonstrated   1. Left ventricular ejection fraction, by estimation, is 60 to 65%. The  left ventricle has normal function.   2. Right ventricular systolic function is normal. The right ventricular  size is normal.   3. Left atrial size was mildly dilated.   4. Small pericardial effusion appears unchanged from prior. a small  pericardial effusion is present. The pericardial effusion is  circumferential.   5. The inferior vena cava is normal in size with greater than 50%  respiratory variability, suggesting right atrial pressure of 3 mmHg.   Conclusion(s)/Recommendation(s): Small circumfrential pericardial  effusion. No tamponade. Effusion unchanged from study on 07/19/23.    CHA2DS2-VASc Score = 3  The patient's score is based upon: CHF History: 0 HTN History: 0 Diabetes History: 0 Stroke History: 0 Vascular Disease History: 1 Age Score: 1 Gender Score: 1       ASSESSMENT AND PLAN: Paroxysmal Atrial Fibrillation (ICD10:  I48.0) The patient's CHA2DS2-VASc score is 3, indicating a 3.2% annual risk of stroke.   S/p afib ablation 11/13/23 Patient appears to be maintaining SR with only very brief palpitations.  Continue Eliquis 5 mg BID with no missed doses for at least 3 months post ablation.  Will discontinue amiodarone today.  Continue Toprol 12.5 mg daily We discussed home rhythm monitoring with [employer].   Secondary Hypercoagulable State (ICD10:  D68.69) The patient is at significant risk for stroke/thromboembolism based upon her CHA2DS2-VASc Score of 3.  Continue Apixaban (Eliquis). No bleeding issues.   CAD CAC score 170 No anginal symptoms Followed by Dr Anne Fu    Follow up with  Francis Dowse as scheduled.        Jorja Loa PA-C Afib Clinic [facility] [address] [place], [place] [address] [phone]

## 2024-02-02 ENCOUNTER — Other Ambulatory Visit: Payer: Self-pay | Admitting: Cardiology

## 2024-02-09 ENCOUNTER — Encounter: Payer: Self-pay | Admitting: Physician Assistant

## 2024-02-09 ENCOUNTER — Ambulatory Visit: Payer: Medicare HMO | Attending: Physician Assistant | Admitting: Physician Assistant

## 2024-02-09 VITALS — BP 130/66 | HR 59 | Ht 64.0 in | Wt 152.2 lb

## 2024-02-09 DIAGNOSIS — I10 Essential (primary) hypertension: Secondary | ICD-10-CM

## 2024-02-09 DIAGNOSIS — R55 Syncope and collapse: Secondary | ICD-10-CM | POA: Diagnosis not present

## 2024-02-09 DIAGNOSIS — I480 Paroxysmal atrial fibrillation: Secondary | ICD-10-CM

## 2024-02-09 DIAGNOSIS — D6869 Other thrombophilia: Secondary | ICD-10-CM | POA: Diagnosis not present

## 2024-02-09 NOTE — Progress Notes (Addendum)
 Cardiology Office Note:  .   Date:  02/09/2024  ID:  Tracey Henson, DOB 08/04/1951, MRN [id number] PCP: Belenda Bowie  [facility] Providers Cardiologist:  Dorothye Gathers, MD Electrophysiologist:  Ardeen Kohler, MD {  History of Present Illness: .   Tracey Henson is a 73 y.o. female w/PMHx of  HLD, non-obstructive CAD (by CT) AFib  She was referred to Dr. Daneil Dunker for symptomatic AFib, at their visit reported symptomatic AFib better on amiodarone  Planned for ablation , amiodarone  continued as a bridge to her procedure  AFib ablation 11/13/23  Saw the AFib clnic 12/11/23, reported som ebrief palpitations, no procedural complications. Amiodarone  stopped  Today's visit is scheduled as her 90 day post ablation visit ROS:   Since her procedure she has not felt particularly well She is tired, fatigues easily, sleeps well, gets up around 7am as usual by 10AM she feels like she needs a nap.  This is not her baseline  She has palpitations, ~2-3 weak, generally at rest will feel her heart rate going up/down rapidly, 50's-60 or so to the 90's and back, her watch HRs correlates and appears to be sudden up and sudden down just like it feels, this lasts 15-20 minutes Very worrisome for her, reminiscent of her AFib (but not hours in duration)  She also notes with exertion like mopping, she feels like her heart beat is strong/heavy heart beats, almost painful, these can make her feel dizzy, lightheaded, perhaps for a second things feel black,  she sits down because she doesn't want to fall. And when supine can feel her heart beat through to her back  All of this new  B/l groin site healed well  Arrhythmia/AAD hx AFib Jan 2025 Amiodarone  started Jan 2025 > stopped 12/11/23, post ablation AFib ablation 11/13/23  Studies Reviewed: Aaron Aas    EKG done today and reviewed by myself:  SB 59bpm, no acute changes  Echo 11/13/23    1. Left ventricular ejection fraction, by estimation, is 60  to 65%. The  left ventricle has normal function.   2. Right ventricular systolic function is normal. The right ventricular  size is normal.   3. Left atrial size was mildly dilated.   4. Small pericardial effusion appears unchanged from prior. a small  pericardial effusion is present. The pericardial effusion is  circumferential.   5. The inferior vena cava is normal in size with greater than 50%  respiratory variability, suggesting right atrial pressure of 3 mmHg.   Conclusion(s)/Recommendation(s): Small circumfrential pericardial  effusion. No tamponade. Effusion unchanged from study on 07/19/23.    11/13/23: EPS/ablation CONCLUSIONS: 1. Successful PVI. 2. Successful ablation/isolation of the posterior wall. 3. Intracardiac echo reveals normal LV size and function and a small pericardial effusion most prominent at the LV apex.  4. No early apparent complications. 5. Resume Eliquis  in the recovery area.  6. Stop amiodarone  in 1 month.  Coronary CTA 07/27/22: IMPRESSION: 1. Coronary calcium  score of 246. This was 33 percentile for age and sex matched control. 2. Normal coronary origin with right dominance. 3. Mild proximal LAD calcified stenosis 25-49%, minimal 0-24% RCA stenosis. 4.  Aortic atherosclerosis. 5.  Recommend goal directed medical therapy.   Echo 07/19/23:  1. Left ventricular ejection fraction, by estimation, is 60 to 65%. The  left ventricle has normal function. The left ventricle has no regional  wall motion abnormalities. There is mild left ventricular hypertrophy.  Left ventricular diastolic parameters  were normal. The  average left ventricular global longitudinal strain is  -17.2 %. The global longitudinal strain is normal.   2. Right ventricular systolic function is normal. The right ventricular  size is normal. Tricuspid regurgitation signal is inadequate for assessing  PA pressure.   3. The mitral valve is normal in structure. Mild mitral valve   regurgitation. No evidence of mitral stenosis.   4. The aortic valve is tricuspid. Aortic valve regurgitation is trivial.  No aortic stenosis is present.   5. The inferior vena cava is normal in size with greater than 50%  respiratory variability, suggesting right atrial pressure of 3 mmHg.    MRI Brain 10/15/23: No acute intracranial abnormality. Negative for subdural collection, hydrocephalus, or mass lesion. No lobar predominant brain parenchymal volume loss identified. If there is ongoing concern regarding a neurodegenerative process then follow-up FDG brain PET may be of benefit for further assessment.    Risk Assessment/Calculations:    Physical Exam:   VS:  LMP  (LMP Unknown)    Wt Readings from Last 3 Encounters:  12/11/23 159 lb 6.4 oz (72.3 kg)  11/13/23 155 lb (70.3 kg)  10/30/23 157 lb 6.4 oz (71.4 kg)    GEN: Well nourished, well developed in no acute distress NECK: No JVD; No carotid bruits CARDIAC: RRR, no murmurs, no rubs, gallops, not distant RESPIRATORY:  CTA b/l without rales, wheezing or rhonchi  ABDOMEN: Soft, non-tender, non-distended EXTREMITIES: No edema; No deformity   ASSESSMENT AND PLAN: .    Paroxysmal AFib CHA2DS2Vasc is 4, on Elqius, appropriately dosed  Symptoms as described above CBC 2 week monitor (live given her dizziness, weakness, and momentary feeling of things gong black)  HTN Looks good  Secondary hypercoagulable state 2/2 AFib   Dispo: back in a month, sooner if needed   Signed, Debbie Fails, PA-C

## 2024-02-09 NOTE — Patient Instructions (Addendum)
 Medication Instructions:   Your physician recommends that you continue on your current medications as directed. Please refer to the Current Medication list given to you today.  *If you need a refill on your cardiac medications before your next appointment, please call your pharmacy*   Lab Work: NONE ORDERED  TODAY    If you have labs (blood work) drawn today and your tests are completely normal, you will receive your results only by: MyChart Message (if you have MyChart) OR A paper copy in the mail If you have any lab test that is abnormal or we need to change your treatment, we will call you to review the results.  Testing/Procedures: Your physician has recommended that you wear an event monitor. Event monitors are medical devices that record the heart's electrical activity. Doctors most often us  these monitors to diagnose arrhythmias. Arrhythmias are problems with the speed or rhythm of the heartbeat. The monitor is a small, portable device. You can wear one while you do your normal daily activities. This is usually used to diagnose what is causing palpitations/syncope (passing out).   Follow-Up: At [facility], you and your health needs are our priority.  As part of our continuing mission to provide you with exceptional heart care, our providers are all part of one team.  This team includes your primary Cardiologist (physician) and Advanced Practice Providers or APPs (Physician Assistants and Nurse Practitioners) who all work together to provide you with the care you need, when you need it.  Your next appointment:   1 month(s) ( CONTACT  CASSIE HALL/ ANGELINE HAMMER FOR EP SCHEDULING ISSUES )   Provider:   Ardeen Kohler, MD or Mertha Abrahams, PA-C   We recommend signing up for the patient portal called "MyChart".  Sign up information is provided on this After Visit Summary.  MyChart is used to connect with patients for Virtual Visits (Telemedicine).  Patients are able to view  lab/test results, encounter notes, upcoming appointments, etc.  Non-urgent messages can be sent to your provider as well.   To learn more about what you can do with MyChart, go to [email or web address].   Other Instructions      ZIO AT Long term monitor-Live Telemetry  Your physician has requested you wear a ZIO patch monitor for 14 days.  This is a single patch monitor. [employer] supplies one patch monitor per enrollment. Additional  stickers are not available.  Please do not apply patch if you will be having a Nuclear Stress Test, Echocardiogram, Cardiac CT, MRI,  or Chest Xray during the period you would be wearing the monitor. The patch cannot be worn during  these tests. You cannot remove and re-apply the ZIO AT patch monitor.  Your ZIO patch monitor will be mailed 3 day USPS to your address on file. It may take 3-5 days to  receive your monitor after you have been enrolled.  Once you have received your monitor, please review the enclosed instructions. Your monitor has  already been registered assigning a specific monitor serial # to you.   Billing and Patient Assistance Program information  Sanna Crystal has been supplied with any insurance information on record for billing. [employer] offers a sliding scale Patient Assistance Program for patients without insurance, or whose  insurance does not completely cover the cost of the ZIO patch monitor. You must apply for the  Patient Assistance Program to qualify for the discounted rate. To apply, call [employer] at [phone],  select option 4, select option 2 ,  ask to apply for the Patient Assistance Program, (you can request an  interpreter if needed). [employer] will ask your household income and how many people are in your  household. [employer] will quote your out-of-pocket cost based on this information. They will also be able  to set up a 12 month interest free payment plan if needed.  Applying the monitor   Shave hair from upper left chest.   Hold the abrader disc by orange tab. Rub the abrader in 40 strokes over left upper chest as indicated in  your monitor instructions.  Clean area with 4 enclosed alcohol pads. Use all pads to ensure the area is cleaned thoroughly. Let  dry.  Apply patch as indicated in monitor instructions. Patch will be placed under collarbone on left side of  chest with arrow pointing upward.  Rub patch adhesive wings for 2 minutes. Remove the white label marked "1". Remove the white label  marked "2". Rub patch adhesive wings for 2 additional minutes.  While looking in a mirror, press and release button in center of patch. A small green light will flash 3-4  times. This will be your only indicator that the monitor has been turned on.  Do not shower for the first 24 hours. You may shower after the first 24 hours.  Press the button if you feel a symptom. You will hear a small click. Record Date, Time and Symptom in  the Patient Log.   Starting the Gateway  In your kit there is a Audiological scientist box the size of a cellphone. This is Buyer, retail. It transmits all your  recorded data to [facility]. This box must always stay within 10 feet of you. Open the box and push the *  button. There will be a light that blinks orange and then green a few times. When the light stops  blinking, the Gateway is connected to the ZIO patch. Call [employer] at [phone] to confirm your monitor is transmitting.  Returning your monitor  Remove your patch and place it inside the Gateway. In the lower half of the Gateway there is a white  bag with prepaid postage on it. Place Gateway in bag and seal. Mail package back to [place] as soon as  possible. Your physician should have your final report approximately 7 days after you have mailed back  your monitor. Call [facility] Customer Care at [phone] if you have questions regarding your ZIO AT  patch monitor. Call them immediately if you see an orange light blinking on  your monitor.  If your monitor falls off in less than 4 days, contact our Monitor department at [phone]. If your  monitor becomes loose or falls off after 4 days call [employer] at [phone] for suggestions on  securing your monitor

## 2024-02-13 ENCOUNTER — Other Ambulatory Visit

## 2024-02-13 DIAGNOSIS — R55 Syncope and collapse: Secondary | ICD-10-CM

## 2024-02-13 NOTE — Progress Notes (Unsigned)
 Enrolled for [employer] to mail a ZIO AT Live Telemetry monitor to patients address on file.   Dr. Daneil Dunker to read.

## 2024-02-15 ENCOUNTER — Ambulatory Visit: Payer: Self-pay | Admitting: Internal Medicine

## 2024-02-15 NOTE — Telephone Encounter (Signed)
 Chief Complaint: dry cough Symptoms: dry cough Frequency: x 6 weeks Pertinent Negatives: Patient denies fever, URI sx, SOB/CP above baseline Disposition: [] ED /[] Urgent Care (no appt availability in office) / [x] Appointment(In office/virtual)/ []  [facility]/ [] Home Care/ [] Refused Recommended Disposition /[] [place] [facility]/ []  Follow-up with PCP Additional Notes: Pt c/o of dry cough x 6 weeks and has been trying to manage with PCP, but no improvement. Pt reports recent CXR that was negative and believes she finished a round of prednisone  without relief of sx. Pt endorses hx of lung nodules and wanted to F/U with [facility]. Scheduled patient per protocol on 02/16/2024 with alternate provider. Patient verbalized understanding and to call back/go to UC/ED with worsening symptoms.      Copied from CRM [id number]. Topic: Clinical - Red Word Triage >> Feb 15, 2024  3:03 PM Juliana Ocean wrote: Red Word that prompted transfer to Nurse Triage: chest hurts/lugs do not feel right/always SOB/also has nodules that need checking Reason for Disposition  Continuous (nonstop) coughing interferes with work, school, or sleeping  Answer Assessment - Initial Assessment Questions E2C2 Pulmonary Triage - Initial Assessment Questions "Chief Complaint (e.g., cough, sob, wheezing, fever, chills, sweat or additional symptoms) *Go to specific symptom protocol after initial questions. cough Went to PCP and was prescribed meds (believes just prednisone ) and sx never went away Reports CXR was normal   "How long have symptoms been present?" X 6 weeks  Have you tested for COVID or Flu? Note: If not, ask patient if a home test can be taken. If so, instruct patient to call back for positive results. No  MEDICINES:   "Have you used any OTC meds to help with symptoms?" Yes If yes, ask "What medications?" Tylenol   "Have you used your inhalers/maintenance medication?" No If yes, "What medications?" N/a  If  inhaler, ask "How many puffs and how often?" Note: Review instructions on medication in the chart. N/z  OXYGEN: "Do you wear supplemental oxygen?" No If yes, "How many liters are you supposed to use?" N/a  "Do you monitor your oxygen levels?" Yes If yes, "What is your reading (oxygen level) today?" 98-99  "What is your usual oxygen saturation reading?"  (Note: Pulmonary O2 sats should be 90% or greater) 98   1. RESPIRATORY STATUS: "Describe your breathing?" (e.g., wheezing, shortness of breath, unable to speak, severe coughing)      cough Went to PCP and was prescribed meds (unknown) and sx never went away Reports CXR was normal 2. ONSET: "When did this breathing problem begin?"      X 6 weeks 3. PATTERN "Does the difficult breathing come and go, or has it been constant since it started?"      Constant - worsening 4. SEVERITY: "How bad is your breathing?" (e.g., mild, moderate, severe)    - MILD: No SOB at rest, mild SOB with walking, speaks normally in sentences, can lie down, no retractions, pulse < 100.    - MODERATE: SOB at rest, SOB with minimal exertion and prefers to sit, cannot lie down flat, speaks in phrases, mild retractions, audible wheezing, pulse 100-120.    - SEVERE: Very SOB at rest, speaks in single words, struggling to breathe, sitting hunched forward, retractions, pulse > 120      SOB at baseline 5. RECURRENT SYMPTOM: "Have you had difficulty breathing before?" If Yes, ask: "When was the last time?" and "What happened that time?"      no 6. CARDIAC HISTORY: "Do you have  any history of heart disease?" (e.g., heart attack, angina, bypass surgery, angioplasty)      Denies Endorses heart ablasion feb 24 7. LUNG HISTORY: "Do you have any history of lung disease?"  (e.g., pulmonary embolus, asthma, emphysema)     nodules 8. CAUSE: "What do you think is causing the breathing problem?"      unknown 9. OTHER SYMPTOMS: "Do you have any other symptoms? (e.g., dizziness,  runny nose, cough, chest pain, fever)     Endorses CP r/t afib (reports at baseline)  Protocols used: Breathing Difficulty-A-AH

## 2024-02-16 ENCOUNTER — Other Ambulatory Visit (HOSPITAL_BASED_OUTPATIENT_CLINIC_OR_DEPARTMENT_OTHER): Payer: Self-pay | Admitting: Nurse Practitioner

## 2024-02-16 ENCOUNTER — Ambulatory Visit (INDEPENDENT_AMBULATORY_CARE_PROVIDER_SITE_OTHER): Admitting: Nurse Practitioner

## 2024-02-16 ENCOUNTER — Encounter (HOSPITAL_BASED_OUTPATIENT_CLINIC_OR_DEPARTMENT_OTHER): Payer: Self-pay | Admitting: Nurse Practitioner

## 2024-02-16 VITALS — BP 142/52 | HR 50 | Ht 64.0 in | Wt 152.3 lb

## 2024-02-16 DIAGNOSIS — J209 Acute bronchitis, unspecified: Secondary | ICD-10-CM | POA: Diagnosis not present

## 2024-02-16 DIAGNOSIS — J069 Acute upper respiratory infection, unspecified: Secondary | ICD-10-CM | POA: Diagnosis not present

## 2024-02-16 MED ORDER — METHYLPREDNISOLONE ACETATE 80 MG/ML IJ SUSP
80.0000 mg | Freq: Once | INTRAMUSCULAR | Status: AC
  Administered 2024-02-16: 80 mg via INTRAMUSCULAR

## 2024-02-16 MED ORDER — PREDNISONE 10 MG PO TABS: ORAL_TABLET | ORAL | 0 refills | Status: DC

## 2024-02-16 MED ORDER — FLUTICASONE PROPIONATE 50 MCG/ACT NA SUSP: 2.0000 | Freq: Every day | NASAL | 2 refills | Status: DC

## 2024-02-16 MED ORDER — PROMETHAZINE-DM 6.25-15 MG/5ML PO SYRP
5.0000 mL | ORAL_SOLUTION | Freq: Four times a day (QID) | ORAL | 0 refills | Status: DC | PRN
Start: 2024-02-16 — End: 2024-02-26

## 2024-02-16 NOTE — Telephone Encounter (Signed)
 FYI pt has upcoming on 02/26/2024 with you

## 2024-02-16 NOTE — Assessment & Plan Note (Signed)
 Acute bronchitis, likely related to viral illness or possible recurrence from prior illness. Will treat her with depo inj 80 mg x 1, cough control measures and prednisone  taper. If symptoms fail to improve or worsen, will obtain imaging and further workup. Component of cough likely related to upper airway irritation from postnasal drainage. Close follow up.  Patient Instructions  Continue omeprazole  1 tab daily Continue montelukast  1 tab daily  Prednisone  taper. 4 tabs for 2 days, then 3 tabs for 2 days, 2 tabs for 2 days, then 1 tab for 2 days, then stop. Take in AM with food. Start tomorrow. Promethazine DM cough syrup 5 mL every 6 hours as needed for cough. May cause drowsiness. Do not drive after taking Use benzonatate  1 capsule Three times a day as needed for cough. Try to use consistently over the next few days to help calm the cough down Start netti pot 1-2 times a day, use bottled distilled water. Follow with flonase 2 sprays each nostril 30 minutes after   Let me know if you're not feeling better by next week and we can bring you back in for a chest x ray  Follow up in 2-3 weeks with Dr. Waymond Hailey or Katie Lawton Dollinger,NP. If symptoms do not improve or worsen, please contact office for sooner follow up or seek emergency care.

## 2024-02-16 NOTE — Telephone Encounter (Signed)
 There's no codeine in this. Please fill rx. Thanks.

## 2024-02-16 NOTE — Assessment & Plan Note (Signed)
 URI. Supportive care measures advised. Discussed role of intranasal steroid and saline rinses. She is willing to try these. See above.

## 2024-02-16 NOTE — Patient Instructions (Signed)
 Continue omeprazole  1 tab daily Continue montelukast  1 tab daily  Prednisone  taper. 4 tabs for 2 days, then 3 tabs for 2 days, 2 tabs for 2 days, then 1 tab for 2 days, then stop. Take in AM with food. Start tomorrow. Promethazine  DM cough syrup 5 mL every 6 hours as needed for cough. May cause drowsiness. Do not drive after taking Use benzonatate  1 capsule Three times a day as needed for cough. Try to use consistently over the next few days to help calm the cough down Start netti pot 1-2 times a day, use bottled distilled water. Follow with flonase  2 sprays each nostril 30 minutes after   Let me know if you're not feeling better by next week and we can bring you back in for a chest x ray  Follow up in 2-3 weeks with Dr. Waymond Hailey or Katie Seymore Brodowski,NP. If symptoms do not improve or worsen, please contact office for sooner follow up or seek emergency care.

## 2024-02-16 NOTE — Progress Notes (Signed)
 @Patient  ID: Tracey Henson, female    DOB: August 14, 1951, 73 y.o.   MRN: [id number]  Chief Complaint  Patient presents with   Acute Visit    Cough x 1 weeks    Referring provider: Belenda Bowie  HPI: 73 year old female, never smoker followed for upper airway cough. She is a patient of Dr. Jacqui Mau and last seen in office 03/31/2022. Past medical history significant for a fib on apixaban , CAD, multiple pulmonary nodules, HLD.   TEST/EVENTS:  07/20/2015 allergy  profile > IgE 10, neg RAST, eos 100 10/26/2018 MRI sinus:  mucus retention cyst within the right maxillary sinus. Other paranasal sinuses appear normal 10/02/2019 PFT: FVC 91, FEV1 91, ratio 77, tlc 113, DLCO 102. No BD  03/31/2022: OV with Dr. Waymond Hailey. Maintained on singulair . Tonna Frederic management. Working back up to walking 2 miles. Doing well on gabapentin . Cough resolved. Does have some muscle cramps dependent on dosage.   02/16/2024: Today - acute  Discussed the use of AI scribe software for clinical note transcription with the patient, who gave verbal consent to proceed.  History of Present Illness   Tracey Henson is a 73 year old female who presents with a persistent cough and sore throat.  She woke up on Monday morning with a sore throat, which progressed to a deep cough by Tuesday night. The cough is deep and associated with back pain, though she is unsure if it is related to her lungs or just pain from coughing. No fever or chills, hemoptysis. She experiences wheezing at night. Breathing is at her baseline. No increased dyspnea, difficulties swallowing, facial pain, headaches.   A few weeks prior, she experienced a similar episode with a sore throat that led to laryngitis and difficulty swallowing. During that episode, a chest x-ray showed no lung abnormalities. She was treated with prednisone  at that time and got better but feels she did not fully recover.   She does not have any inhalers that she uses. Her cough has  previously resolved for the most part.   Currently, she experiences a sensation of mucus rising in her throat that she cannot expel so cough is non-productive. She does have nasal congestion and drainage, which is primarily clear. She is taking montelukast  (Singulair ) every night and omeprazole  (Prilosec). She discontinued gabapentin  about a year ago. She does not use nasal sprays due to discomfort but occasionally uses a neti pot, although not recently.       Allergies  Allergen Reactions   Vancomycin  Rash    Redness of skin    Atorvastatin  Other (See Comments)   Cefaclor Hives   Codeine     Slows heart rate right down   Crestor  [Rosuvastatin ] Nausea And Vomiting   Gabapentin      Muscle aches   Nexlizet  [Bempedoic Acid-Ezetimibe] Other (See Comments)   Oxybutynin Other (See Comments)    Tongue burning    Immunization History  Administered Date(s) Administered   Influenza,inj,Quad PF,6+ Mos 06/08/2015, 08/09/2016   Influenza-Unspecified 06/27/2017   PFIZER(Purple Top)SARS-COV-2 Vaccination 12/07/2019, 12/28/2019    Past Medical History:  Diagnosis Date   Asthma    seasonal   Baker cyst    left leg   Baker's cyst, left    left leg   Breast cyst    Frequent headaches    GERD (gastroesophageal reflux disease)    Hearing loss    bilateral   Hypercholesteremia    Muscle cramps    Vertigo  Tobacco History: Social History   Tobacco Use  Smoking Status Never   Passive exposure: Past  Smokeless Tobacco Never  Tobacco Comments   Never smoked 12/11/23   Counseling given: Not Answered Tobacco comments: Never smoked 12/11/23   Outpatient Medications Prior to Visit  Medication Sig Dispense Refill   acetaminophen  (TYLENOL ) 325 MG tablet Take 2 tablets (650 mg total) by mouth every 4 (four) hours as needed for headache or mild pain (pain score 1-3).     amitriptyline  (ELAVIL ) 25 MG tablet TAKE 1 TABLET BY MOUTH EVERYDAY AT BEDTIME (Patient taking differently: Take  25 mg by mouth at bedtime as needed (Headache).) 90 tablet 2   apixaban  (ELIQUIS ) 5 MG TABS tablet Take 1 tablet (5 mg total) by mouth 2 (two) times daily. 60 tablet 6   Cholecalciferol (VITAMIN D3) 50 MCG (2000 UT) TABS Take 2,000 Units by mouth daily.     cyanocobalamin  1000 MCG tablet Take 1,000 mcg by mouth daily.     diltiazem  (CARDIZEM ) 30 MG tablet Take 1 tablet (30 mg total) by mouth every 6 (six) hours as needed. (Patient taking differently: Take 30 mg by mouth every 6 (six) hours as needed (AFIB).) 30 tablet 3   metoprolol  succinate (TOPROL  XL) 25 MG 24 hr tablet Take 0.5 tablets (12.5 mg total) by mouth daily. 45 tablet 3   montelukast  (SINGULAIR ) 10 MG tablet TAKE 1 TABLET BY MOUTH EVERYDAY AT BEDTIME 90 tablet 0   omeprazole  (PRILOSEC) 40 MG capsule TAKE 1 CAPSULE EVERY DAY 90 capsule 3   pravastatin  (PRAVACHOL ) 40 MG tablet Take 1 tablet (40 mg total) by mouth every evening. 90 tablet 3   REPATHA  SURECLICK 140 MG/ML SOAJ inject 140 MG into THE SKIN EVERY 14 DAYS 2 mL 4   Rimegepant Sulfate (NURTEC) 75 MG TBDP Take 1 tablet (75 mg total) by mouth daily as needed. 8 tablet 6   valACYclovir (VALTREX) 500 MG tablet Take 500 mg by mouth every other day.     ZINC GLUCONATE PO Take 8 mg by mouth daily. Elderberry 100 mg     No facility-administered medications prior to visit.     Review of Systems:   Constitutional: No weight loss or gain, night sweats, fevers, chills, fatigue, or lassitude. HEENT: No headaches, difficulty swallowing, tooth/dental problems. No sneezing, itching, ear ache +nasal congestion, post nasal drip, scratchy throat CV:  No chest pain, orthopnea, PND, swelling in lower extremities, anasarca, dizziness, palpitations, syncope Resp: +cough; wheezing; chest tightness; baseline shortness of breath with exertion. No hemoptysis.  No chest wall deformity GI:  No heartburn, indigestion, abdominal pain, nausea, vomiting, diarrhea, change in bowel habits, loss of appetite,  bloody stools.  GU: No dysuria, change in color of urine, urgency or frequency.  No flank pain, no hematuria  Skin: No rash, lesions, ulcerations MSK:  No joint pain or swelling.  +upper back pain  Neuro: No dizziness or lightheadedness.  Psych: No depression or anxiety. Mood stable.     Physical Exam:  BP (!) 142/52   Pulse (!) 50   Ht 5\' 4"  (1.626 m)   Wt 152 lb 4.8 oz (69.1 kg)   LMP  (LMP Unknown)   SpO2 99%   BMI 26.14 kg/m   GEN: Pleasant, interactive, well-kempt; in no acute distress HEENT:  Normocephalic and atraumatic. PERRLA. Sclera white. Nasal turbinates erythematous, moist and patent bilaterally. Clear rhinorrhea present. Oropharynx erythematous and moist, without exudate or edema. No lesions, ulcerations NECK:  Supple w/ fair  ROM. No JVD present. Normal carotid impulses w/o bruits. Thyroid  symmetrical with no goiter or nodules palpated. Submandibular lymphadenopathy.   CV: RRR, no m/r/g, no peripheral edema. Pulses intact, +2 bilaterally. No cyanosis, pallor or clubbing. PULMONARY:  Unlabored, regular breathing. Mild rhonchi bilaterally A&P. Bronchitic cough. No accessory muscle use.  GI: BS present and normoactive. Soft, non-tender to palpation. No organomegaly or masses detected.  MSK: No erythema, warmth or tenderness. Cap refil <2 sec all extrem. No deformities or joint swelling noted.  Neuro: A/Ox3. No focal deficits noted.   Skin: Warm, no lesions or rashe Psych: Normal affect and behavior. Judgement and thought content appropriate.     Lab Results:  CBC    Component Value Date/Time   WBC 7.2 10/30/2023 0836   WBC 9.9 09/21/2023 1927   RBC 4.43 10/30/2023 0836   RBC 4.85 09/21/2023 1927   HGB 12.8 10/30/2023 0836   HCT 39.5 10/30/2023 0836   PLT 248 10/30/2023 0836   MCV 89 10/30/2023 0836   MCH 28.9 10/30/2023 0836   MCH 28.0 09/21/2023 1927   MCHC 32.4 10/30/2023 0836   MCHC 32.5 09/21/2023 1927   RDW 12.6 10/30/2023 0836   LYMPHSABS 2.1  09/21/2023 1927   MONOABS 0.7 09/21/2023 1927   EOSABS 0.1 09/21/2023 1927   BASOSABS 0.0 09/21/2023 1927    BMET    Component Value Date/Time   NA 143 10/30/2023 0836   K 4.5 10/30/2023 0836   CL 107 (H) 10/30/2023 0836   CO2 23 10/30/2023 0836   GLUCOSE 108 (H) 10/30/2023 0836   GLUCOSE 88 09/21/2023 1927   BUN 15 10/30/2023 0836   CREATININE 1.11 (H) 10/30/2023 0836   CALCIUM  9.5 10/30/2023 0836   GFRNONAA >60 09/21/2023 1927   GFRAA 83 03/05/2019 1127    BNP No results found for: "BNP"   Imaging:  No results found.  methylPREDNISolone  acetate (DEPO-MEDROL ) injection 80 mg     Date Action Dose Route User   02/16/2024 1010 Given 80 mg Intramuscular (Right Quadriceps) Kary Pages, CMA          Latest Ref Rng & Units 10/02/2019    9:07 AM  PFT Results  FVC-Pre L 2.88   FVC-Predicted Pre % 91   FVC-Post L 3.07   FVC-Predicted Post % 97   Pre FEV1/FVC % % 77   Post FEV1/FCV % % 77   FEV1-Pre L 2.20   FEV1-Predicted Pre % 91   FEV1-Post L 2.35   DLCO uncorrected ml/min/mmHg 20.75   DLCO UNC% % 102   DLVA Predicted % 100   TLC L 5.89   TLC % Predicted % 113   RV % Predicted % 132     No results found for: "NITRICOXIDE"      Assessment & Plan:   Acute bronchitis Acute bronchitis, likely related to viral illness or possible recurrence from prior illness. Will treat her with depo inj 80 mg x 1, cough control measures and prednisone  taper. If symptoms fail to improve or worsen, will obtain imaging and further workup. Component of cough likely related to upper airway irritation from postnasal drainage. Close follow up.  Patient Instructions  Continue omeprazole  1 tab daily Continue montelukast  1 tab daily  Prednisone  taper. 4 tabs for 2 days, then 3 tabs for 2 days, 2 tabs for 2 days, then 1 tab for 2 days, then stop. Take in AM with food. Start tomorrow. Promethazine DM cough syrup 5 mL every 6 hours as  needed for cough. May cause drowsiness. Do  not drive after taking Use benzonatate  1 capsule Three times a day as needed for cough. Try to use consistently over the next few days to help calm the cough down Start netti pot 1-2 times a day, use bottled distilled water. Follow with flonase 2 sprays each nostril 30 minutes after   Let me know if you're not feeling better by next week and we can bring you back in for a chest x ray  Follow up in 2-3 weeks with Dr. Waymond Hailey or Katie Jalasia Eskridge,NP. If symptoms do not improve or worsen, please contact office for sooner follow up or seek emergency care.    URI (upper respiratory infection) URI. Supportive care measures advised. Discussed role of intranasal steroid and saline rinses. She is willing to try these. See above.    Advised if symptoms do not improve or worsen, to please contact office for sooner follow up or seek emergency care.   I spent 35 minutes of dedicated to the care of this patient on the date of this encounter to include pre-visit review of records, face-to-face time with the patient discussing conditions above, post visit ordering of testing, clinical documentation with the electronic health record, making appropriate referrals as documented, and communicating necessary findings to members of the patients care team.  Roetta Clarke, NP 02/16/2024  Pt aware and understands NP's role.

## 2024-02-19 ENCOUNTER — Encounter: Payer: Self-pay | Admitting: Internal Medicine

## 2024-02-21 LAB — CBC
Hematocrit: 43.3 % (ref 34.0–46.6)
Hemoglobin: 13.5 g/dL (ref 11.1–15.9)
MCH: 27.6 pg (ref 26.6–33.0)
MCHC: 31.2 g/dL — ABNORMAL LOW (ref 31.5–35.7)
MCV: 88 fL (ref 79–97)
Platelets: 318 10*3/uL (ref 150–450)
RBC: 4.9 x10E6/uL (ref 3.77–5.28)
RDW: 12.8 % (ref 11.7–15.4)
WBC: 13.8 10*3/uL — ABNORMAL HIGH (ref 3.4–10.8)

## 2024-02-22 ENCOUNTER — Ambulatory Visit: Payer: Self-pay | Admitting: Physician Assistant

## 2024-02-24 NOTE — Progress Notes (Unsigned)
 Subjective:    Patient ID: Tracey Henson, female    DOB: 1951-03-06     MRN: [id number]    Brief patient profile:  78   yowf never smoker with h/o seasonal rhinitis  X 1980s then intermittent cough mid 1990s not correlating with rhinitis maybe once or twice a week esp hs then doe progressive x 2016 and can't do fast pace walking or hills due to sob > cardiac eval > CT heart > mpns > referred 06/08/2015 by Dr Paulene Boron to pulmonary clinic.     History of Present Illness  06/08/2015 1st Tracey Henson   Chief Complaint  Patient presents with   Pulmonary Consult    Referred by Dr. Paulene Boron for pulmonary nodules. Pt states that she has had DOE for "as long as I can remember"- gets out of breath pushing a lawn mower. She has had CP and cough that come and go.    onset of cough was indolent x 4 y prior to OV  assoc with sense of pnds daily and at hs but no excess mucus, then midline cp with severe coughing fits that occur once or twice a week assoc with doe and variable HB rec Try prilosec otc 20mg   Take 30-60 min before first meal of the day and Pepcid  ac (famotidine ) 20 mg one @  bedtime  Until return  For drainage take chlortrimeton (chlorpheniramine) 4 mg every 4 hours available over the counter (may cause drowsiness)  GERD diet  If still coughing you will need tessilon 200 mg up to 4 hours as needed     11/20/2018  "New pt eval" Tracey Henson/ / does not remember whether h2 helped so now maint prilosec 1 h ac only 20 mg Chief Complaint  Patient presents with   Pulmonary Consult    Rerferred by Dr Renna Cary for pulmonary nodules. Pt c/o SOB and non prod cough x 7 wks. She states she gets SOB walking room to room.  She is sleeping propped up due to SOB when she lies down flat.  Dyspnea:  Sob and cough abruptly  started about the same time acute s obvious trigger/ sev rounds abx and pred s help now persistent day and noct  Cough: 24/7 dry mostly early on insp worse with  cold air /perfumes   Sleeping: can't sleep even sitting up due to cough / can't take any narcotics "my heart rate drops real low"  SABA use: none 02:  None  gen ant chest discomfort with coughing fits  rec Prednisone  10 mg take  4 each am x 2 days,   2 each am x 2 days,  1 each am x 2 days and stop  Gabapentin  100 mg three times daily  Restart the singulair  10 mg daily  For drainage / throat tickle try take CHLORPHENIRAMINE  4 mg  Pantoprazole  (protonix ) 40 mg   Take  30-60 min before first meal of the day and prilosec 30 min before the last  and Pepcid  (famotidine )  20 mg one @  bedtime  GERD diet   03/31/2022  f/u ov/Tracey Henson re: cough is gone    maint on singulair , gerd rx only   Chief Complaint  Patient presents with   Acute Visit    Muscle cramps.   Dyspnea:  working back up to 2 miles  Cough: gone/ no need for tessalon   Sleeping: flat bed on side fine SABA use: none 02: none  Muscle cramps on gabapentin  100  mg qid have resolved, thinks she may have had similar problem with prior use but note the cough is also gone now  Rec No change In medications    02/16/24 NP recs ACUTELY ILL on May 26   ST/WHEEZING  p similar illness 1st week in may assoc with cough with  ST / dysphagia neg strep  rx dysphagia and completely resolved.  Rx by NP :  Prednisone  taper. 4 tabs for 2 days, then 3 tabs for 2 days, 2 tabs for 2 days, then 1 tab for 2 days, then stop. Take in AM with food. Start tomorrow. Promethazine  DM cough syrup 5 mL every 6 hours as needed for cough. May cause drowsiness. Do not drive after taking Use benzonatate  1 capsule Three times a day as needed for cough. Try to use consistently over the next few days to help calm the cough down Start netti pot 1-2 times a day, use bottled distilled water. Follow with flonase  2 sprays each nostril 30 minutes after    02/26/2024  f/u ov/Tracey Henson re: severe cough maint on prilosec 20 / singulair  and did not ramp up gerd p onset of flare  Chief Complaint  Patient  presents with   URI    Worsening symptoms. X3 weeks. Was on Prednisone  and finished this on Saturday   Dyspnea:  no change  Cough: non productive cough hurts in back below R scapula and middle of chest  Sleeping: on side/ flat bed one thick pillow with wheezing and can't sleep assoc with sensation of pnds  SABA use: none  02: none      No obvious day to day or daytime variability or assoc excess/ purulent sputum or mucus plugs or hemoptysis or cp or chest tightness,   or overt   hb symptoms.    Also denies any obvious fluctuation of symptoms with weather or environmental changes or other aggravating or alleviating factors except as outlined above   No unusual exposure hx or h/o childhood pna/ asthma or knowledge of premature birth.  Current Allergies, Complete Past Medical History, Past Surgical History, Family History, and Social History were reviewed in [employer] record.  ROS  The following are not active complaints unless bolded Hoarseness, sore throat, dysphagia, dental problems, itching, sneezing,  nasal congestion or discharge of excess mucus or purulent secretions, ear ache,   fever, chills, sweats, unintended wt loss or wt gain, classically pleuritic or exertional cp,  orthopnea pnd or arm/hand swelling  or leg swelling, presyncope, palpitations, abdominal pain, anorexia, nausea, vomiting, diarrhea  or change in bowel habits or change in bladder habits, change in stools or change in urine, dysuria, hematuria,  rash, arthralgias, visual complaints, headache, numbness, weakness or ataxia or problems with walking or coordination,  change in mood or  memory.        Current Meds  Medication Sig   acetaminophen  (TYLENOL ) 325 MG tablet Take 2 tablets (650 mg total) by mouth every 4 (four) hours as needed for headache or mild pain (pain score 1-3).   amitriptyline  (ELAVIL ) 25 MG tablet TAKE 1 TABLET BY MOUTH EVERYDAY AT BEDTIME (Patient taking differently: Take 25  mg by mouth at bedtime as needed (Headache).)   apixaban  (ELIQUIS ) 5 MG TABS tablet Take 1 tablet (5 mg total) by mouth 2 (two) times daily.   Cholecalciferol (VITAMIN D3) 50 MCG (2000 UT) TABS Take 2,000 Units by mouth daily.   cyanocobalamin  1000 MCG tablet Take 1,000 mcg by mouth daily.  diltiazem  (CARDIZEM ) 30 MG tablet Take 1 tablet (30 mg total) by mouth every 6 (six) hours as needed. (Patient taking differently: Take 30 mg by mouth every 6 (six) hours as needed (AFIB).)   fluticasone  (FLONASE ) 50 MCG/ACT nasal spray Place 2 sprays into both nostrils daily.   metoprolol  succinate (TOPROL  XL) 25 MG 24 hr tablet Take 0.5 tablets (12.5 mg total) by mouth daily.   montelukast  (SINGULAIR ) 10 MG tablet TAKE 1 TABLET BY MOUTH EVERYDAY AT BEDTIME   omeprazole  (PRILOSEC) 40 MG capsule TAKE 1 CAPSULE EVERY DAY   pravastatin  (PRAVACHOL ) 40 MG tablet Take 1 tablet (40 mg total) by mouth every evening.   REPATHA  SURECLICK 140 MG/ML SOAJ inject 140 MG into THE SKIN EVERY 14 DAYS   valACYclovir (VALTREX) 500 MG tablet Take 500 mg by mouth every other day.   ZINC GLUCONATE PO Take 8 mg by mouth daily. Elderberry 100 mg                    Objective:   Physical Exam    Wts  02/26/2024       151   03/31/2022     148  03/17/2022     149  11/19/2020       160 06/27/2019     156  03/18/2019     155  07/20/2015   139 >  08/18/2015 142 > 11/20/2018   155    06/08/15 140 lb (63.504 kg)  05/22/15 140 lb (63.504 kg)  07/04/14 138 lb (62.596 kg)    Vital signs reviewed  02/26/2024  - Note at rest 02 sats  96% on RA   General appearance:    amb wf nad/ dry raspy sounding upper airway pattern cough    HEENT : Oropharynx  min erythema / on pnd/cobblestoning      Nasal turbinates nl    NECK :  without  apparent JVD/ palpable Nodes/TM    LUNGS: no acc muscle use,  Nl contour chest which is clear to A and P bilaterally without cough on insp or exp maneuvers   CV:  RRR  no s3 or murmur or increase in P2,  and no edema   ABD:  soft and nontender   MS:   ext warm without deformities Or obvious joint restrictions  calf tenderness, cyanosis or clubbing    SKIN: warm and dry without lesions    NEURO:  alert, approp, nl sensorium with  no motor or cerebellar deficits apparent.          Cxr requested pm 02/26/2024              Assessment & Plan:

## 2024-02-26 ENCOUNTER — Encounter: Payer: Self-pay | Admitting: Internal Medicine

## 2024-02-26 ENCOUNTER — Ambulatory Visit: Admitting: Internal Medicine

## 2024-02-26 VITALS — BP 126/54 | HR 62 | Temp 97.8°F | Ht 64.0 in | Wt 151.6 lb

## 2024-02-26 DIAGNOSIS — R058 Other specified cough: Secondary | ICD-10-CM

## 2024-02-26 DIAGNOSIS — J069 Acute upper respiratory infection, unspecified: Secondary | ICD-10-CM | POA: Diagnosis not present

## 2024-02-26 MED ORDER — PANTOPRAZOLE SODIUM 40 MG PO TBEC: 40.0000 mg | DELAYED_RELEASE_TABLET | Freq: Every day | ORAL | 2 refills | Status: DC

## 2024-02-26 MED ORDER — PROMETHAZINE-DM 6.25-15 MG/5ML PO SYRP: 5.0000 mL | ORAL_SOLUTION | Freq: Four times a day (QID) | ORAL | 0 refills | Status: DC | PRN

## 2024-02-26 MED ORDER — FAMOTIDINE 20 MG PO TABS: ORAL_TABLET | ORAL | 11 refills | Status: AC

## 2024-02-26 MED ORDER — METHYLPREDNISOLONE ACETATE 80 MG/ML IJ SUSP
120.0000 mg | Freq: Once | INTRAMUSCULAR | Status: AC
Start: 2024-02-26 — End: 2024-02-26
  Administered 2024-02-26: 120 mg via INTRAMUSCULAR

## 2024-02-26 NOTE — Patient Instructions (Addendum)
 At  very onset of cough, sorethroat , diffuculty swallowing, wheezing:  Pantoprazole  (protonix ) 40 mg   Take  30-60 min before first meal of the day and Pepcid  (famotidine )  20 mg after supper until return to office - this is the best way to tell whether stomach acid is contributing to your problem.     GERD (REFLUX)  is an extremely common cause of respiratory symptoms just like yours , many times with no obvious heartburn at all.    It can be treated with medication, but also with lifestyle changes including elevation of the head of your bed (ideally with 6 -8inch blocks under the headboard of your bed),  Smoking cessation, avoidance of late meals, excessive alcohol, and avoid fatty foods, chocolate, peppermint, colas, red wine, and acidic juices such as orange juice.  NO MINT OR MENTHOL PRODUCTS SO NO COUGH DROPS  USE SUGARLESS CANDY INSTEAD (Jolley ranchers or Stover's or Life Savers) or even ice chips will also do - the key is to swallow to prevent all throat clearing. NO OIL BASED VITAMINS - use powdered substitutes.  Avoid fish oil when coughing.   For drainage / throat tickle try take CHLORPHENIRAMINE  4 mg  ("Allergy  Relief" 4mg   at [facility] should be easiest to find in the blue box usually on bottom shelf)  take one every 4 hours as needed - extremely effective and inexpensive over the counter- may cause drowsiness so start with just a dose or two an hour before bedtime and see how you tolerate it before trying in daytime.   For cough > promethazine  DM 1-2 tsp every 4 hours as needed - if not shutting down the cough, will need to add a short term narcotic but these are similar to codeine which you are allergic to   Please remember to go to the  x-ray department in AM   for your tests - we will call you with the results when they are available     Please schedule a follow up office visit in 4 weeks, sooner if needed  with all medications /inhalers/ solutions in hand so we can  verify exactly what you are taking. This includes all medications from all doctors and over the counters      For drainage / throat tickle try take CHLORPHENIRAMINE  4 mg  ("Allergy  Relief" 4mg   at [facility] should be easiest to find in the blue box usually on bottom shelf)  take one every 4 hours as needed - extremely effective and inexpensive over the counter- may cause drowsiness so start with just a dose or two an hour before bedtime and see how you tolerate it before trying in daytime.

## 2024-02-26 NOTE — Assessment & Plan Note (Signed)
 Onset recurrent cough mid 1990s Allergy  profile 07/20/2015 >  IgE 10, neg RAST, Eos  0.1  - start singulair  10 mg 07/20/2015 > improved on this plus gerd rx  - recurrence early Jan 2020 on prilosec 20 mg daily/ off singulair  - MRI sinus 10/26/2018 : There is a mucous retention cyst within the right maxillary sinus.  The other paranasal sinuses appear normal. - 11/20/2018 restarted singulair / gabapentin  rx as intol of narcs ("low pulse") > cough  improved as of 01/31/2019  - Covid 19 Antibodies 03/18/2019 = neg  - 06/27/2019 tapering off gabapenitin > f/u prn flare - 11/19/2020 resumed gabapentin  for flare since 04/2020  - 01/2022 recurrrent cough/ sob same as prior flares off gabapentin  - 03/17/2022 cyclical cough rx with gabapentin  build to 1200 mg / day or lowest effective dose > cramps at 400 mg / day but cough resolved > d/c'd 03/30/22 - 04/04/2022 cough recurred off gabapentin  so rec 1st gen H1 blockers per guidelines  / trial of lyricc 50 mg bid and f/u Dr Brent Cambric at [facility] - 02/26/2024 relapsed since 1st week in may 2025 with    st > severe cough while maint on prilosec 20 mg only  so rec protonix  and pepcid / diet/ prometazine DM/ 1st gen H1 blockers per guidelines  / depomedrol 120 mg IM   Of the three most common causes of  Sub-acute / recurrent or chronic cough, only one (GERD)  can actually contribute to/ trigger  the other two (asthma and post nasal drip syndrome)  and perpetuate the cylce of cough.  While not intuitively obvious, many patients with chronic low grade reflux do not cough until there is a primary insult that disturbs the protective epithelial barrier and exposes sensitive nerve endings.   This is typically viral but can due to PNDS and  either may apply here.     The point is that once this occurs, it is difficult to eliminate the cycle  using anything but a maximally effective acid suppression regimen at least in the short run, accompanied by an appropriate diet to address non acid GERD and  control / eliminate the cough itself for at least 3 days with phenergan  dm and 1st gen H1 blockers per guidelines  >>> also added depomedrol 120 mg IM in case of component of Th-2 driven upper or lower airways inflammation (if cough responds short term only to relapse before return while will on full rx for uacs (as above), then that would point to allergic rhinitis/ asthma or eos bronchitis as alternative dx)    F/u 4 weeks with all meds in hand using a trust but verify approach to confirm accurate Medication  Reconciliation The principal here is that until we are certain that the  patients are doing what we've asked, it makes no sense to ask them to do more.           Each maintenance medication was reviewed in detail including emphasizing most importantly the difference between maintenance and prns and under what circumstances the prns are to be triggered using an action plan format where appropriate.  Total time for H and P, chart review, counseling,  and generating customized AVS unique to this office visit / same day charting = 44 min  for multiple  refractory respiratory  symptoms of uncertain etiology

## 2024-02-27 ENCOUNTER — Ambulatory Visit

## 2024-02-27 DIAGNOSIS — R058 Other specified cough: Secondary | ICD-10-CM | POA: Diagnosis not present

## 2024-02-28 ENCOUNTER — Encounter: Payer: Self-pay | Admitting: Internal Medicine

## 2024-03-02 ENCOUNTER — Ambulatory Visit: Payer: Self-pay | Admitting: Internal Medicine

## 2024-03-07 ENCOUNTER — Encounter: Payer: Self-pay | Admitting: Internal Medicine

## 2024-03-08 NOTE — Telephone Encounter (Signed)
 I didn't prescribe the pantoprazole ; Dr. Waymond Hailey did. Typically we don't continue both. She can stay on the pantoprazole  daily and stop the omeprazole . Thanks.

## 2024-03-13 DIAGNOSIS — R55 Syncope and collapse: Secondary | ICD-10-CM | POA: Diagnosis not present

## 2024-03-13 NOTE — Progress Notes (Signed)
 Electrophysiology Office Note:   Date:  03/15/2024  ID:  Gustavo Meditz, DOB [birth year], MRN [id number]  Primary Cardiologist: Oneil Parchment, MD Primary Heart Failure: None Electrophysiologist: Fonda Kitty, MD      History of Present Illness:   Ernisha Sorn is a 73 y.o. female with h/o paroxysmal atrial fibrillation, mild to moderate nonobstructive coronary disease, HLD who was seen for evaluation of atrial fibrillation. Patient presented to ED on 09/21/23 with a heart rate of 143bpm. New diagnosis of atrial fibrillation. She reports that she had palpitations accompanied by significant fatigue. Started on metoprolol  and Eliquis  without issue.  She underwent successful atrial fibrillation ablation on 11/13/2023.  Discussed the use of AI scribe software for clinical note transcription with the patient, who gave verbal consent to proceed.  History of Present Illness She experiences occasional sensations of palpitations, described as feeling like a 'football game going on'. These episodes are infrequent, occurring less than one percent of the time, and no significant episodes were recorded while wearing the cardiac monitor.  Her history of atrial fibrillation is noted, but the recent cardiac monitor did not show any episodes of AFib. Instead, it revealed occasional premature atrial contractions, which she is more aware of than others might be. These extra beats are not frequent.  She is currently taking metoprolol  and diltiazem , which she tolerates well.  She reports a new symptom of weakness in her legs, particularly when trying to push herself up from a kneeling position. She describes having 'no strength' in her legs but has no pain, swelling, or trouble breathing. She has not engaged in physical therapy or exercises to address this issue.  No chest pain, swelling in the legs, or trouble breathing. She experiences dyspnea on exertion when walking, which is a longstanding issue and has not  changed.   Review of systems complete and found to be negative unless listed in HPI.   EP Information / Studies Reviewed:    EKG is ordered today. Personal review as below.  EKG Interpretation Date/Time:  Thursday March 14 2024 09:35:30 EDT Ventricular Rate:  60 PR Interval:  138 QRS Duration:  76 QT Interval:  412 QTC Calculation: 412 R Axis:   75  Text Interpretation: Normal sinus rhythm Normal ECG When compared with ECG of 09-Feb-2024 14:10, No significant change was found Confirmed by Kitty Fonda [phone]) on 03/14/2024 9:51:41 AM  EKG 09/21/23: AF   Zio 02/2024:  Patch Wear Time:  14 days and 0 hours (2025-05-31T16:53:50-0400 to 2025-06-14T16:53:50-0400)   HR 40 - 174, average 60 bpm. 2 nonsustained SVT (longest 12 beats) No atrial fibrillation detected. Rare supraventricular ectopy. Rare ventricular ectopy. No sustained arrhythmias. Symptom trigger episodes correspond to predominantly to normal sinus rhythm, sometimes with PACs and PSVT (10 beats) on 1 occasion.   Coronary CTA 07/27/22: IMPRESSION: 1. Coronary calcium  score of 246. This was 39 percentile for age and sex matched control. 2. Normal coronary origin with right dominance. 3. Mild proximal LAD calcified stenosis 25-49%, minimal 0-24% RCA stenosis. 4.  Aortic atherosclerosis. 5.  Recommend goal directed medical therapy.  Echo 07/19/23:  1. Left ventricular ejection fraction, by estimation, is 60 to 65%. The  left ventricle has normal function. The left ventricle has no regional  wall motion abnormalities. There is mild left ventricular hypertrophy.  Left ventricular diastolic parameters  were normal. The average left ventricular global longitudinal strain is  -17.2 %. The global longitudinal strain is normal.   2. Right ventricular systolic function is  normal. The right ventricular  size is normal. Tricuspid regurgitation signal is inadequate for assessing  PA pressure.   3. The mitral valve is normal  in structure. Mild mitral valve  regurgitation. No evidence of mitral stenosis.   4. The aortic valve is tricuspid. Aortic valve regurgitation is trivial.  No aortic stenosis is present.   5. The inferior vena cava is normal in size with greater than 50%  respiratory variability, suggesting right atrial pressure of 3 mmHg.   MRI Brain 10/15/23: No acute intracranial abnormality. Negative for subdural collection, hydrocephalus, or mass lesion. No lobar predominant brain parenchymal volume loss identified. If there is ongoing concern regarding a neurodegenerative process then follow-up FDG brain PET may be of benefit for further assessment.   Risk Assessment/Calculations:    CHA2DS2-VASc Score = 3   This indicates a 3.2% annual risk of stroke. The patient's score is based upon: CHF History: 0 HTN History: 0 Diabetes History: 0 Stroke History: 0 Vascular Disease History: 1 Age Score: 1 Gender Score: 1         Physical Exam:   VS:  BP (!) 149/68   Pulse 60   Ht 5' 4 (1.626 m)   Wt 150 lb (68 kg)   LMP  (LMP Unknown)   SpO2 98%   BMI 25.75 kg/m    Wt Readings from Last 3 Encounters:  03/14/24 150 lb (68 kg)  02/26/24 151 lb 9.6 oz (68.8 kg)  02/16/24 152 lb 4.8 oz (69.1 kg)     GEN: Well nourished, well developed in no acute distress NECK: No JVD CARDIAC: Normal rate, regular rhythm.  RESPIRATORY:  Clear to auscultation without rales, wheezing or rhonchi  ABDOMEN: Soft, non-distended EXTREMITIES:  No edema; No deformity   ASSESSMENT AND PLAN:    #. Palpitations:  #. PACs: Rare <1%, but correlate with symptoms on Zio.  #. PSVT: Infrequent, longest 12 beats, but correlating with symptoms on Zio. - Continue metoprolol  XL 12.5 mg once daily.  Diltiazem  30 mg as needed.  #. Paroxysmal atrial fibrillation, symptomatic: Status post PFA ablation on 11/13/2023. No known recurrence. - Continue metoprolol  XL 12.5 mg once daily.  Diltiazem  30 mg as needed.  #. Secondary  hypercoagulable state due to atrial fibrillation:  -CHADSVASC score of 3. -Continue Eliquis  5mg  BID.   Signed, Fonda Kitty, MD

## 2024-03-14 ENCOUNTER — Encounter: Payer: Self-pay | Admitting: Cardiology

## 2024-03-14 ENCOUNTER — Ambulatory Visit: Attending: Cardiology | Admitting: Cardiology

## 2024-03-14 VITALS — BP 149/68 | HR 60 | Ht 64.0 in | Wt 150.0 lb

## 2024-03-14 DIAGNOSIS — I471 Supraventricular tachycardia, unspecified: Secondary | ICD-10-CM

## 2024-03-14 DIAGNOSIS — I480 Paroxysmal atrial fibrillation: Secondary | ICD-10-CM | POA: Diagnosis not present

## 2024-03-14 DIAGNOSIS — D6869 Other thrombophilia: Secondary | ICD-10-CM

## 2024-03-14 DIAGNOSIS — R002 Palpitations: Secondary | ICD-10-CM | POA: Diagnosis not present

## 2024-03-14 DIAGNOSIS — I491 Atrial premature depolarization: Secondary | ICD-10-CM

## 2024-03-14 NOTE — Patient Instructions (Signed)
 Medication Instructions:  Your physician recommends that you continue on your current medications as directed. Please refer to the Current Medication list given to you today.  *If you need a refill on your cardiac medications before your next appointment, please call your pharmacy*  Follow-Up: At [facility], you and your health needs are our priority.  As part of our continuing mission to provide you with exceptional heart care, our providers are all part of one team.  This team includes your primary Cardiologist (physician) and Advanced Practice Providers or APPs (Physician Assistants and Nurse Practitioners) who all work together to provide you with the care you need, when you need it.  Your next appointment:   6 months  Provider:   You may see Ardeen Kohler, MD or one of the following Advanced Practice Providers on your designated Care Team:   Mertha Abrahams, [place] [address] [place], PA-C Suzann Riddle, NP Creighton Doffing, NP

## 2024-03-21 ENCOUNTER — Telehealth: Payer: Self-pay | Admitting: Internal Medicine

## 2024-03-21 NOTE — Telephone Encounter (Signed)
 Copied from CRM [id number]. Topic: Appointments - Scheduling Inquiry for Clinic >> Mar 20, 2024 11:01 AM [name] wrote: Reason for CRM: Patient states she is supposed to have a 1 month follow up - there is no availability with Dr.Wert until September. Patient is upset and would like to speak with office. >> Mar 20, 2024 11:48 AM [name] wrote: Dr. Darlean has two July 9th appts same day avail. Since Ptis upset can we use one of those slots?   **Confirmed w/ Provider's CMA that one slot will be okay for use on 07/09.   Attempted to call patient, but no answer and left a VM.

## 2024-03-25 ENCOUNTER — Other Ambulatory Visit: Payer: Self-pay | Admitting: Cardiology

## 2024-03-25 NOTE — Telephone Encounter (Signed)
 Prescription refill request for Eliquis  received. Indication: PAF Last office visit: 03/14/24  JINNY Kitty MD Scr: 1.11 on 10/30/23  Epic Age: 73 Weight: 68kg  Based on above findings Eliquis  5mg  twice daily is the appropriate dose.  Refill approved.

## 2024-03-26 NOTE — Telephone Encounter (Signed)
Made appt. NFN

## 2024-03-28 ENCOUNTER — Ambulatory Visit: Admitting: Internal Medicine

## 2024-03-28 ENCOUNTER — Encounter: Payer: Self-pay | Admitting: Internal Medicine

## 2024-03-28 VITALS — BP 147/76 | HR 52 | Temp 97.6°F | Ht 64.0 in | Wt 151.4 lb

## 2024-03-28 DIAGNOSIS — R058 Other specified cough: Secondary | ICD-10-CM

## 2024-03-28 NOTE — Assessment & Plan Note (Addendum)
 Onset recurrent cough mid 1990s Allergy  profile 07/20/2015 >  IgE 10, neg RAST, Eos  0.1  - start singulair  10 mg 07/20/2015 > improved on this plus gerd rx  - recurrence early Jan 2020 on prilosec 20 mg daily/ off singulair  - MRI sinus 10/26/2018 : There is a mucous retention cyst within the right maxillary sinus.  The other paranasal sinuses appear normal. - 11/20/2018 restarted singulair / gabapentin  rx as intol of narcs (low pulse) > cough  improved as of 01/31/2019  - Covid 19 Antibodies 03/18/2019 = neg  - 06/27/2019 tapering off gabapenitin > f/u prn flare - 11/19/2020 resumed gabapentin  for flare since 04/2020  - 01/2022 recurrrent cough/ sob same as prior flares off gabapentin  - 03/17/2022 cyclical cough rx with gabapentin  build to 1200 mg / day or lowest effective dose > cramps at 400 mg / day but cough resolved > d/c'd 03/30/22 - 04/04/2022 cough recurred off gabapentin  so rec 1st gen H1 blockers per guidelines  / trial of lyricc 50 mg bid and f/u Dr Brien at [facility] - 02/26/2024 relapsed since 1st week in may 2025 wit st > severe cough on prilosec 20 mg only  so rec protonix  and pepcid / diet/ prometazine DM/ 1st gen H1 blockers per guidelines  / depomedrol 120 mg IM > marked improvement though never got the H1 and still throat clearing on return 03/28/2024 to see ent w/in a week  No lung problem identified and she has approp ent f/u with option of referring to [facility] at [facility] for globus sensation   In meantime rec continue max gerd rx at least until ent eval in a week and use hard rock candy to suppress throat clearing during the day.         Each maintenance medication was reviewed in detail including emphasizing most importantly the difference between maintenance and prns and under what circumstances the prns are to be triggered using an action plan format where appropriate.  Total time for H and P, chart review, counseling,   and generating customized AVS unique to this office visit / same day  charting = 30 min summary final ov.

## 2024-03-28 NOTE — Patient Instructions (Addendum)
 GERD (REFLUX)  is an extremely common cause of respiratory symptoms just like yours , many times with no obvious heartburn at all.    It can be treated with medication, but also with lifestyle changes including elevation of the head of your bed (ideally with 6 -8inch blocks under the headboard of your bed),  Smoking cessation, avoidance of late meals, excessive alcohol, and avoid fatty foods, chocolate, peppermint, colas, red wine, and acidic juices such as orange juice.  NO MINT OR MENTHOL PRODUCTS SO NO COUGH DROPS - LUDENs ok  USE SUGARLESS CANDY INSTEAD (Jolley ranchers or Stover's or Life Savers) or even ice chips will also do - the key is to swallow to prevent all throat clearing. NO OIL BASED VITAMINS - use powdered substitutes.  Avoid fish oil when coughing.   Consider stopping the protonix  after ENT visit - consider referral to Dr Elspeth Silvan at [facility] ENT/voice center   If you are satisfied with your treatment plan,  let your doctor know and he/she can either refill your medications or you can return here when your prescription runs out.     If in any way you are not 100% satisfied,  please tell us .  If 100% better, tell your friends!  Pulmonary follow up is as needed

## 2024-03-28 NOTE — Progress Notes (Signed)
 Subjective:    Patient ID: Tracey Henson, female    DOB: October 02, 1950     MRN: [id number]    Brief patient profile:  62   yowf never smoker with h/o seasonal rhinitis  X 1980s then intermittent cough mid 1990s not correlating with rhinitis maybe once or twice a week esp hs then doe progressive x 2016 and can't do fast pace walking or hills due to sob > cardiac eval > CT heart > mpns > referred 06/08/2015 by Dr Austin to pulmonary clinic.     History of Present Illness  06/08/2015 1st [facility] office visit/ Tracey Henson   Chief Complaint  Patient presents with   Pulmonary Consult    Referred by Dr. Austin for pulmonary nodules. Pt states that she has had DOE for as long as I can remember- gets out of breath pushing a lawn mower. She has had CP and cough that come and go.    onset of cough was indolent x 4 y prior to OV  assoc with sense of pnds daily and at hs but no excess mucus, then midline cp with severe coughing fits that occur once or twice a week assoc with doe and variable HB rec Try prilosec otc 20mg   Take 30-60 min before first meal of the day and Pepcid  ac (famotidine ) 20 mg one @  bedtime  Until return  For drainage take chlortrimeton (chlorpheniramine) 4 mg every 4 hours available over the counter (may cause drowsiness)  GERD diet  If still coughing you will need tessilon 200 mg up to 4 hours as needed     11/20/2018  New pt eval Tracey Henson/ / does not remember whether h2 helped so now maint prilosec 1 h ac only 20 mg Chief Complaint  Patient presents with   Pulmonary Consult    Rerferred by Dr Jeffrie for pulmonary nodules. Pt c/o SOB and non prod cough x 7 wks. She states she gets SOB walking room to room.  She is sleeping propped up due to SOB when she lies down flat.  Dyspnea:  Sob and cough abruptly  started about the same time acute s obvious trigger/ sev rounds abx and pred s help now persistent day and noct  Cough: 24/7 dry mostly early on insp worse with  cold air /perfumes   Sleeping: can't sleep even sitting up due to cough / can't take any narcotics my heart rate drops real low  SABA use: none 02:  None  gen ant chest discomfort with coughing fits  rec Prednisone  10 mg take  4 each am x 2 days,   2 each am x 2 days,  1 each am x 2 days and stop  Gabapentin  100 mg three times daily  Restart the singulair  10 mg daily  For drainage / throat tickle try take CHLORPHENIRAMINE  4 mg  Pantoprazole  (protonix ) 40 mg   Take  30-60 min before first meal of the day and prilosec 30 min before the last  and Pepcid  (famotidine )  20 mg one @  bedtime  GERD diet   03/31/2022  f/u ov/Tracey Henson re: cough is gone    maint on singulair , gerd rx only   Chief Complaint  Patient presents with   Acute Visit    Muscle cramps.   Dyspnea:  working back up to 2 miles  Cough: gone/ no need for tessalon   Sleeping: flat bed on side fine SABA use: none 02: none  Muscle cramps on gabapentin  100  mg qid have resolved, thinks she may have had similar problem with prior use but note the cough is also gone now  Rec No change In medications    02/16/24 NP recs ACUTELY ILL on May 26   ST/WHEEZING  p similar illness 1st week in may assoc with cough with  ST / dysphagia neg strep  rx dysphagia and completely resolved.  Rx by NP :  Prednisone  taper. 4 tabs for 2 days, then 3 tabs for 2 days, 2 tabs for 2 days, then 1 tab for 2 days, then stop. Take in AM with food. Start tomorrow. Promethazine  DM cough syrup 5 mL every 6 hours as needed for cough. May cause drowsiness. Do not drive after taking Use benzonatate  1 capsule Three times a day as needed for cough. Try to use consistently over the next few days to help calm the cough down Start netti pot 1-2 times a day, use bottled distilled water. Follow with flonase  2 sprays each nostril 30 minutes after    02/26/2024  f/u ov/Tracey Henson re: severe cough maint on prilosec 20 / singulair  and did not ramp up gerd p onset of flare  Chief Complaint  Patient  presents with   URI    Worsening symptoms. X3 weeks. Was on Prednisone  and finished this on Saturday   Dyspnea:  no change  Cough: non productive cough hurts in back below R scapula and middle of chest  Sleeping: on side/ flat bed one thick pillow with wheezing and can't sleep assoc with sensation of pnds  SABA use: none  02: none  Rec At  very onset of cough, sorethroat , diffuculty swallowing, wheezing: Pantoprazole  (protonix ) 40 mg   Take  30-60 min before first meal of the day and Pepcid  (famotidine )  20 mg after supper until return to office - this is the best way to tell whether stomach acid is contributing to your problem.   GERD diet reviewed, bed blocks rec  For drainage / throat tickle try take CHLORPHENIRAMINE  4 mg    For cough > promethazine  DM 1-2 tsp every 4 hours as needed - if not shutting down the cough, will need to add a short term narcotic but these are similar to codeine which you are allergic to  Cxr ok   Please schedule a follow up office visit in 4 weeks, sooner if needed  with all medications /inhalers/ solutions in hand       03/28/2024  f/u ov/Tracey Henson re: uacs    maint on gerd rx / ginger lozenges did not  bring meds - did not get H1 at all  Chief Complaint  Patient presents with   Follow-up    Acute bronchitis, Pulmonary Nodules , pt states symptoms of scratchy throat in the morning and hard to swallow   Dyspnea:  good activity tol / walking more  Cough: dry better at present and no promethazine  for weeks/ globus sensation  Sleeping: bed is flat / 1 big pillow  resp cc  SABA use: none  02: none    No obvious day to day or daytime variability or assoc excess/ purulent sputum or mucus plugs or hemoptysis or cp or chest tightness, subjective wheeze or overt sinus or hb symptoms.    Also denies any obvious fluctuation of symptoms with weather or environmental changes or other aggravating or alleviating factors except as outlined above   No unusual exposure hx  or h/o childhood pna/ asthma or knowledge of premature birth.  Current Allergies, Complete Past Medical History, Past Surgical History, Family History, and Social History were reviewed in [employer] record.  ROS  The following are not active complaints unless bolded Hoarseness, sore throat, dysphagia/globus sensation , dental problems, itching, sneezing,  nasal congestion or discharge of excess mucus or purulent secretions, ear ache,   fever, chills, sweats, unintended wt loss or wt gain, classically pleuritic or exertional cp,  orthopnea pnd or arm/hand swelling  or leg swelling, presyncope, palpitations, abdominal pain, anorexia, nausea, vomiting, diarrhea  or change in bowel habits or change in bladder habits, change in stools or change in urine, dysuria, hematuria,  rash, arthralgias, visual complaints, headache, numbness, weakness or ataxia or problems with walking or coordination,  change in mood or  memory. Muscle cramps from gabapentin  and ? PPI ?         Current Meds  Medication Sig   acetaminophen  (TYLENOL ) 325 MG tablet Take 2 tablets (650 mg total) by mouth every 4 (four) hours as needed for headache or mild pain (pain score 1-3).   amitriptyline  (ELAVIL ) 25 MG tablet TAKE 1 TABLET BY MOUTH EVERYDAY AT BEDTIME (Patient taking differently: Take 25 mg by mouth at bedtime as needed (Headache).)   apixaban  (ELIQUIS ) 5 MG TABS tablet TAKE 1 TABLET BY MOUTH 2 TIMES DAILY   Cholecalciferol (VITAMIN D3) 50 MCG (2000 UT) TABS Take 2,000 Units by mouth daily.   cyanocobalamin  1000 MCG tablet Take 1,000 mcg by mouth daily.   famotidine  (PEPCID ) 20 MG tablet One after supper   metoprolol  succinate (TOPROL  XL) 25 MG 24 hr tablet Take 0.5 tablets (12.5 mg total) by mouth daily.   montelukast  (SINGULAIR ) 10 MG tablet TAKE 1 TABLET BY MOUTH EVERYDAY AT BEDTIME   pantoprazole  (PROTONIX ) 40 MG tablet Take 1 tablet (40 mg total) by mouth daily. Take 30-60 min before first meal of  the day   pravastatin  (PRAVACHOL ) 40 MG tablet Take 1 tablet (40 mg total) by mouth every evening.   REPATHA  SURECLICK 140 MG/ML SOAJ inject 140 MG into THE SKIN EVERY 14 DAYS   valACYclovir (VALTREX) 500 MG tablet Take 500 mg by mouth every other day.   ZINC GLUCONATE PO Take 8 mg by mouth daily. Elderberry 100 mg                    Objective:   Physical Exam    Wts  03/28/2024     151  02/26/2024       151   03/31/2022     148  03/17/2022     149  11/19/2020       160 06/27/2019     156  03/18/2019     155  07/20/2015   139 >  08/18/2015 142 > 11/20/2018   155    06/08/15 140 lb (63.504 kg)  05/22/15 140 lb (63.504 kg)  07/04/14 138 lb (62.596 kg)     Vital signs reviewed  03/28/2024  - Note at rest 02 sats  97% on RA   General appearance:   amb pleasant wf / freq throat clearing    HEENT : Oropharynx  pristine, no pnd or cobblestoning      Nasal turbinates nl    NECK :  without  apparent JVD/ palpable Nodes/TM    LUNGS: no acc muscle use,  Nl contour chest which is clear to A and P bilaterally without cough on insp or exp maneuvers   CV:  RRR  no s3 or murmur or increase in P2, and no edema   ABD:  soft and nontender   MS:  Gait nl   ext warm without deformities Or obvious joint restrictions  calf tenderness, cyanosis or clubbing    SKIN: warm and dry without lesions    NEURO:  alert, approp, nl sensorium with  no motor or cerebellar deficits apparent.             Assessment & Plan:

## 2024-03-30 ENCOUNTER — Other Ambulatory Visit: Payer: Self-pay | Admitting: Physician Assistant

## 2024-04-24 ENCOUNTER — Other Ambulatory Visit: Payer: Self-pay | Admitting: Internal Medicine

## 2024-04-24 ENCOUNTER — Ambulatory Visit: Admitting: Internal Medicine

## 2024-04-25 ENCOUNTER — Telehealth: Payer: Self-pay | Admitting: *Deleted

## 2024-04-25 ENCOUNTER — Telehealth: Payer: Self-pay | Admitting: Internal Medicine

## 2024-04-25 MED ORDER — PANTOPRAZOLE SODIUM 40 MG PO TBEC: 40.0000 mg | DELAYED_RELEASE_TABLET | Freq: Every day | ORAL | 2 refills | Status: DC

## 2024-04-25 NOTE — Telephone Encounter (Signed)
 Copied from CRM [id number]. Topic: Clinical - Medication Refill >> Apr 25, 2024 10:32 AM Celestine FALCON wrote: Medication: pantoprazole  (PROTONIX ) 40 MG tablet   Has the patient contacted their pharmacy? Yes; pt is on the last refill so needs a new prescription.  (Agent: If no, request that the patient contact the pharmacy for the refill. If patient does not wish to contact the pharmacy document the reason why and proceed with request.) (Agent: If yes, when and what did the pharmacy advise?)  This is the patient's preferred pharmacy:    [facility] - [place], [place] - [address] [place] [address] Phone: [phone] Fax: [phone]  Is this the correct pharmacy for this prescription? Yes If no, delete pharmacy and type the correct one.   Has the prescription been filled recently? Yes  Is the patient out of the medication? No  Has the patient been seen for an appointment in the last year OR does the patient have an upcoming appointment? Yes  Can we respond through MyChart? Yes  Agent: Please be advised that Rx refills may take up to 3 business days. We ask that you follow-up with your pharmacy.

## 2024-04-25 NOTE — Telephone Encounter (Signed)
 Copied from CRM [id number]. Topic: Clinical - Medical Advice >> Apr 25, 2024 10:33 AM Celestine FALCON wrote: Reason for CRM: Pt wanted to confirm with CMA Lucie she did see ENT provider and everything looks great. I did put in a refill for pantoprazole  (PROTONIX ) 40 MG tablet per the pt stating she didn't have refills remaining. Pt's phone number is [phone] ok to leave a vm.   I spoke with the pt and she reports no longer needing the protonix  Last visit here mentioned this was ok to d/c after ENT eval  I have removed from her MAR  Nothing further needed

## 2024-04-25 NOTE — Telephone Encounter (Signed)
 ATC x1  Lmtcb Dr Darlean recommendation regarding the mediation prontonix in last OV summary :  Consider stopping the protonix  after ENT visit - consider referral to Dr Elspeth Silvan at [facility] ENT/voice center   Called to see if she had seen the ENT and to remind her of his recommendation here. Not refilling medication until pt gets message and If she wants them refill will ask dr wert before I do that

## 2024-04-25 NOTE — Telephone Encounter (Signed)
 I called and spoke with the pt  She states that she did not call the office and ask for refill on pantoprazole   She states that someone named Alan from out office called her yesterday and asked her if she needed refill   She told them the med has been d/c per ENT and she no longer takes it  I apologized for any confusion  Nothing further needed

## 2024-05-07 ENCOUNTER — Telehealth: Payer: Self-pay

## 2024-05-07 NOTE — Telephone Encounter (Signed)
 Spoke with patient Verbalized understanding and hung up.     -NFN

## 2024-05-07 NOTE — Telephone Encounter (Signed)
 Copied from CRM #[id number]. Topic: Clinical - Medication Question >> May 03, 2024  2:41 PM Tracey Henson wrote: Reason for CRM: Patient states she would like to get pantoprazole  (PROTONIX ) 40 MG  re-prescribed again.   Callback number: [phone]

## 2024-06-13 ENCOUNTER — Other Ambulatory Visit: Payer: Self-pay | Admitting: Pharmacist

## 2024-09-11 ENCOUNTER — Telehealth: Payer: Self-pay

## 2024-09-11 NOTE — Telephone Encounter (Signed)
 Pharmacy Patient Advocate Encounter   Received notification from Fax that prior authorization for Nurtec is due for renewal.   Insurance verification completed.   The patient is insured through N/A.  Action: Patient hasn't been seen in your office in over a year. Plan requires updated chart notes for PA renewal.

## 2024-09-26 ENCOUNTER — Encounter: Payer: Self-pay | Admitting: Cardiology

## 2024-09-26 ENCOUNTER — Other Ambulatory Visit: Payer: Self-pay

## 2024-09-26 MED ORDER — APIXABAN 5 MG PO TABS: 5.0000 mg | ORAL_TABLET | Freq: Two times a day (BID) | ORAL | 5 refills | Status: AC

## 2024-09-26 NOTE — Telephone Encounter (Signed)
 Prescription refill request for Eliquis  received. Indication: A-Fib Last office visit: 03/14/24 Scr: 1.11 10/30/23 Care Everywhere Age: 74 Weight: 68.7 KG Pt has Passed Parameters. Dose Good

## 2024-11-28 ENCOUNTER — Ambulatory Visit: Admitting: Pulmonary Disease
